# Patient Record
Sex: Male | Born: 1975 | Race: White | Hispanic: No | Smoking: Former smoker
Health system: Southern US, Community
[De-identification: ages and names within clinical notes are randomized; demographics above are authoritative.]

## PROBLEM LIST (undated history)

## (undated) DIAGNOSIS — E669 Obesity, unspecified: Secondary | ICD-10-CM

## (undated) DIAGNOSIS — E785 Hyperlipidemia, unspecified: Secondary | ICD-10-CM

## (undated) DIAGNOSIS — I10 Essential (primary) hypertension: Secondary | ICD-10-CM

## (undated) DIAGNOSIS — K760 Fatty (change of) liver, not elsewhere classified: Secondary | ICD-10-CM

## (undated) DIAGNOSIS — R002 Palpitations: Secondary | ICD-10-CM

## (undated) HISTORY — DX: Hyperlipidemia, unspecified: E78.5

## (undated) HISTORY — DX: Fatty (change of) liver, not elsewhere classified: K76.0

## (undated) HISTORY — DX: Obesity, unspecified: E66.9

## (undated) HISTORY — DX: Essential (primary) hypertension: I10

## (undated) HISTORY — DX: Palpitations: R00.2

## (undated) HISTORY — PX: OTHER SURGICAL HISTORY: SHX169

---

## 2008-09-26 ENCOUNTER — Ambulatory Visit: Payer: Self-pay | Admitting: Cardiology

## 2008-10-08 ENCOUNTER — Encounter: Payer: Self-pay | Admitting: Cardiology

## 2008-10-08 ENCOUNTER — Ambulatory Visit: Payer: Self-pay

## 2010-11-24 ENCOUNTER — Encounter: Payer: Self-pay | Admitting: Family Medicine

## 2010-11-24 DIAGNOSIS — E669 Obesity, unspecified: Secondary | ICD-10-CM

## 2010-11-24 DIAGNOSIS — K760 Fatty (change of) liver, not elsewhere classified: Secondary | ICD-10-CM | POA: Insufficient documentation

## 2010-11-24 DIAGNOSIS — E785 Hyperlipidemia, unspecified: Secondary | ICD-10-CM

## 2010-11-24 DIAGNOSIS — I10 Essential (primary) hypertension: Secondary | ICD-10-CM

## 2010-11-24 DIAGNOSIS — N400 Enlarged prostate without lower urinary tract symptoms: Secondary | ICD-10-CM | POA: Insufficient documentation

## 2010-11-24 DIAGNOSIS — R002 Palpitations: Secondary | ICD-10-CM | POA: Insufficient documentation

## 2011-01-19 NOTE — Assessment & Plan Note (Signed)
[facility]                            CARDIOLOGY OFFICE NOTE   DAYLN, TUGWELL                      MRN:          [id number]  DATE:09/26/2008                            DOB:          01-16-1976    REASON FOR CONSULTATION:  Evaluate the patient with palpitations.   HISTORY OF PRESENT ILLNESS:  The patient is a very pleasant 35 year old  gentleman with episodes of palpitations in the middle of the night.  He  describes these as waking him up from his sleep.  He described a  fluttering.  He was not describing tachy palpitations or sustained rapid  rates.  He feels cold during these.  He takes aspirin and drinks water  and he thinks this slowly improves, but it may take an hour or so.  He  does not have any of these symptoms during the day.  He is not getting  any presyncope or syncope.  He is not having any chest pressure, neck,  or arm discomfort.  He is not having any new shortness of breath and  denies any PND or orthopnea.  He did have labs drawn that included  normal potassium, renal function, and thyroid studies.  He has had  chronic elevated liver enzymes related to fatty liver.  He did wear a 30-  day event monitor.  He did press the button several times, but there  were no documented arrhythmias other than what sounds like sinus  bradycardia, sinus arrhythmia.  Of note, the patient does snore.  He has  had a recent diagnosis of hypertension.  He is now on medications.   PAST MEDICAL HISTORY:  Hypertension, recently diagnosed.  He has no  hyperlipidemia, diabetes, or other major medical problems.   PAST SURGICAL HISTORY:  He has had tympanostomy tubes.   ALLERGIES:  PENICILLIN and KEFLEX.   MEDICATIONS:  1. Zyrtec 10 mg daily.  2. Benicar HCT 40/25 daily.   SOCIAL HISTORY:  The patient works as a Psychologist, educational at a call center.  He is  divorced.  He has one child.  He does not smoke cigarettes.  He does not  drink alcohol.   FAMILY  HISTORY:  Contributory for his father having atrial fibrillation,  but otherwise, no heart disease, sudden cardiac death, and  cardiomyopathy.   REVIEW OF SYSTEMS:  As stated in the HPI.  Negative for all other  systems.   PHYSICAL EXAMINATION:  GENERAL:  The patient is pleasant and in no  distress.  VITAL SIGNS:  Blood pressure 150/98, heart rate 112 and regular, weight  376 pounds, and body mass index greater than 45.  HEENT:  Eyelids unremarkable, pupils equal, round, and reactive to  light, fundi not visualized, oral mucosa unremarkable.  NECK:  No jugular venous distention at 45 degrees, carotid upstroke  brisk and symmetric, no bruits, no thyromegaly.  LYMPHATICS:  No cervical, axillary, or inguinal adenopathy.  LUNGS:  Clear to auscultation bilaterally.  BACK:  No costovertebral angle tenderness.  CHEST:  Unremarkable.  HEART:  PMI not displaced or sustained, S1 and S2 within normal limits,  no  S3, no S4, no clicks, no rubs, no murmurs.  ABDOMEN:  Morbidly obese, positive bowel sounds normal in frequency and  pitch, no bruits, no rebound, no guarding, no midline pulsatile mass, no  hepatomegaly, no splenomegaly.  SKIN:  No rashes, no nodules.  EXTREMITIES:  Pulses 2+ throughout, no edema, no cyanosis, or clubbing.  NEURO:  Oriented to person, place, and time.  Cranial nerves II through  XII are grossly intact, motor grossly intact.   EKG sinus rhythm, rate 88, axis within normal limits, intervals within  normal limits, no acute ST-T wave changes.   ASSESSMENT AND PLAN:  1. Tachy palpitations.  The patient is describing some palpitations.      However, these predominately gone away since he stopped caffeine      which he was drinking quite a bit prior to this.  He has also been      placed on something for his blood pressure.  He has had between      these 2 things the symptoms have for the most part abated.  He did      wear a 30-day event monitor without capturing any  events.  I do not      think further monitoring or lab work is indicated.  He should get      an echocardiogram to make sure he has a structurally normal heart.      I suspect he does, though his exam is compromised by his weight.  2. Hypertension.  Blood pressure is elevated today.  However, he says      he has some white coat problems.  He was recently started on      Benicar and I will not make any changes to his regimen.  3. Elevated liver enzymes.  He apparently has a diagnosis of fatty      liver and this is followed by Dr. Christell Constant.  4. Morbid obesity.  It took quite a bit of time talking this over with      the patient and his mother that was in the room.  This is his life      threatening medical condition.  I have referred him to the [employer].  We also discussed the [employer].  He needs to do      something dramatic for weight loss and hopefully, he will comply      with that after this conversation.  5. Sleep apnea.  I suspect the patient has sleep apnea.  Certainly,      weight loss would be the preferred treatment.  If he has continued      hypertension and difficult to control, I would clearly send him for      sleep study, but we will defer to Dr. Christell Constant.  6. Followup will be based on the results of the echocardiogram.     Rollene Rotunda, MD, [facility]  Electronically Signed    JH/MedQ  DD: 09/26/2008  DT: 09/27/2008  Job #: [id number]   cc:   Ernestina Penna, M.D.

## 2012-06-26 ENCOUNTER — Ambulatory Visit: Payer: Self-pay | Admitting: Licensed Clinical Social Worker

## 2012-12-11 ENCOUNTER — Telehealth: Payer: Self-pay | Admitting: Pharmacist Clinician (PhC)/ Clinical Pharmacy Specialist

## 2013-01-02 ENCOUNTER — Ambulatory Visit (INDEPENDENT_AMBULATORY_CARE_PROVIDER_SITE_OTHER): Payer: 59 | Admitting: Pharmacist Clinician (PhC)/ Clinical Pharmacy Specialist

## 2013-01-02 VITALS — BP 142/90 | HR 88

## 2013-01-02 DIAGNOSIS — E785 Hyperlipidemia, unspecified: Secondary | ICD-10-CM

## 2013-01-02 DIAGNOSIS — R7989 Other specified abnormal findings of blood chemistry: Secondary | ICD-10-CM

## 2013-01-02 DIAGNOSIS — E291 Testicular hypofunction: Secondary | ICD-10-CM

## 2013-01-02 NOTE — Addendum Note (Signed)
Addended by: Monica Becton on: 01/02/2013 04:33 PM   Modules accepted: Orders

## 2013-01-02 NOTE — Addendum Note (Signed)
Addended by: Orma Render F on: 01/02/2013 04:29 PM   Modules accepted: Orders

## 2013-01-02 NOTE — Progress Notes (Signed)
  Subjective:    Patient ID: Corey Wilcox, male    DOB: 13-Mar-1976, 37 y.o.   MRN: [id number]  Hyperlipidemia This is a chronic problem. The current episode started more than 1 year ago. The problem is uncontrolled. Recent lipid tests were reviewed and are variable. Exacerbating diseases include liver disease and obesity. Factors aggravating his hyperlipidemia include fatty foods. Associated symptoms include shortness of breath. Current antihyperlipidemic treatment includes statins and diet change. The current treatment provides moderate improvement of lipids. There are no compliance problems.       Review of Systems  Constitutional: Negative.   HENT: Negative.   Eyes: Negative.   Respiratory: Positive for shortness of breath.   Cardiovascular: Negative.   Endocrine: Negative.   Genitourinary: Negative.   Allergic/Immunologic: Negative.   Neurological: Negative.   Hematological: Negative.   Psychiatric/Behavioral: Negative.        Objective:   Physical Exam  Constitutional: He is oriented to person, place, and time. He appears well-developed and well-nourished.  Cardiovascular: Normal rate, regular rhythm and normal heart sounds.   Pulmonary/Chest: No respiratory distress. He has no wheezes. He has no rales.  Musculoskeletal: He exhibits no edema and no tenderness.  Neurological: He is alert and oriented to person, place, and time.  Skin: Skin is warm and dry.  Psychiatric: He has a normal mood and affect. His behavior is normal. Judgment and thought content normal.          Assessment & Plan:  Patient had a snycopal episode today while attempting to have blood drawn by D[facility]. He has difficulty having his blood drawn every time and is a hard stick.  Patient received regular coke and peanut butter crackers in office and recovered.  He is taking crestor for hyperlipidemia so we need to obtain a lipid profile and also A1C with his past borderline elevations.  He will try to  come in next week, well hydrated, to have it drawn.  Gave a new meal plan to patient with counseling on diet and exercise goals.  Chari Manning

## 2013-01-02 NOTE — Addendum Note (Signed)
Addended by: Orma Render F on: 01/02/2013 04:28 PM   Modules accepted: Orders

## 2013-01-17 ENCOUNTER — Other Ambulatory Visit: Payer: Self-pay

## 2013-01-17 MED ORDER — ROSUVASTATIN CALCIUM 10 MG PO TABS: 10.0000 mg | ORAL_TABLET | Freq: Every day | ORAL | Status: DC

## 2013-01-18 NOTE — Telephone Encounter (Signed)
appt made for 01/02/13

## 2013-02-06 ENCOUNTER — Other Ambulatory Visit: Payer: Self-pay

## 2013-02-06 MED ORDER — OLMESARTAN MEDOXOMIL-HCTZ 40-25 MG PO TABS: 1.0000 | ORAL_TABLET | Freq: Every day | ORAL | Status: DC

## 2013-04-23 ENCOUNTER — Ambulatory Visit (INDEPENDENT_AMBULATORY_CARE_PROVIDER_SITE_OTHER): Payer: 59 | Admitting: Family Medicine

## 2013-04-23 ENCOUNTER — Telehealth: Payer: Self-pay | Admitting: Family Medicine

## 2013-04-23 ENCOUNTER — Encounter: Payer: Self-pay | Admitting: Family Medicine

## 2013-04-23 VITALS — BP 149/95 | HR 109 | Temp 98.2°F | Wt 389.8 lb

## 2013-04-23 DIAGNOSIS — J209 Acute bronchitis, unspecified: Secondary | ICD-10-CM

## 2013-04-23 MED ORDER — AZITHROMYCIN 250 MG PO TABS: ORAL_TABLET | ORAL | Status: DC

## 2013-04-23 MED ORDER — HYDROCODONE-HOMATROPINE 5-1.5 MG/5ML PO SYRP: 5.0000 mL | ORAL_SOLUTION | Freq: Three times a day (TID) | ORAL | Status: DC | PRN

## 2013-04-23 NOTE — Telephone Encounter (Signed)
Appt given for today 

## 2013-04-23 NOTE — Progress Notes (Signed)
  Subjective:    Patient ID: Javontay Vandam, male    DOB: 04-04-1976, 37 y.o.   MRN: [id number]  HPI  This 37 y.o. male presents for evaluation of persistent cough, uri sx's, and wheezing for Over a week.  He c/o mucopurulent productive sputum.  Review of Systems C/o cough and uri sx's.   No chest pain, SOB, HA, dizziness, vision change, N/V, diarrhea, constipation, dysuria, urinary urgency or frequency, myalgias, arthralgias or rash.  Objective:   Physical Exam  Vital signs noted  Well developed well nourished male.  HEENT - Head atraumatic Normocephalic                Eyes - PERRLA, Conjuctiva - clear Sclera- Clear EOMI                Ears - EAC's Wnl TM's Wnl Gross Hearing WNL                Nose - Nares patent                 Throat - oropharanx wnl Respiratory - Lungs diminished due to body habitus and scattered wheezes throughout. Cardiac - RRR S1 and S2 without murmur       Assessment & Plan:  Acute bronchitis - Plan: HYDROcodone-homatropine (HYCODAN) 5-1.5 MG/5ML syrup, azithromycin (ZITHROMAX) 250 MG tablet Continue mucinex, push po fluids, rest and rtc prn if sx's persist or continue.

## 2013-04-23 NOTE — Patient Instructions (Signed)

## 2013-06-09 ENCOUNTER — Telehealth: Payer: Self-pay | Admitting: Family Medicine

## 2013-06-11 ENCOUNTER — Telehealth: Payer: Self-pay | Admitting: Family Medicine

## 2013-06-11 MED ORDER — OLMESARTAN MEDOXOMIL-HCTZ 40-25 MG PO TABS: 1.0000 | ORAL_TABLET | Freq: Every day | ORAL | Status: DC

## 2013-06-11 NOTE — Telephone Encounter (Signed)
x

## 2013-06-12 NOTE — Telephone Encounter (Signed)
[facility] called today

## 2013-07-07 ENCOUNTER — Other Ambulatory Visit: Payer: Self-pay | Admitting: Family Medicine

## 2013-07-09 NOTE — Telephone Encounter (Signed)
May give patient one month of medicine, he also needs an appointment to be seen within that time

## 2013-07-09 NOTE — Telephone Encounter (Signed)
Patient last seen at first of year. Was to return for bloodwork but doesn't look like that was done. Please advise

## 2013-08-13 ENCOUNTER — Other Ambulatory Visit: Payer: Self-pay | Admitting: Family Medicine

## 2013-08-15 NOTE — Telephone Encounter (Signed)
Last lipids 01/02/13

## 2013-09-03 ENCOUNTER — Encounter: Payer: Self-pay | Admitting: General Practice

## 2013-09-03 ENCOUNTER — Ambulatory Visit (INDEPENDENT_AMBULATORY_CARE_PROVIDER_SITE_OTHER): Payer: 59

## 2013-09-03 ENCOUNTER — Ambulatory Visit (INDEPENDENT_AMBULATORY_CARE_PROVIDER_SITE_OTHER): Payer: 59 | Admitting: General Practice

## 2013-09-03 ENCOUNTER — Encounter (INDEPENDENT_AMBULATORY_CARE_PROVIDER_SITE_OTHER): Payer: Self-pay

## 2013-09-03 VITALS — BP 152/100 | HR 105 | Temp 97.7°F | Ht 68.7 in | Wt 395.0 lb

## 2013-09-03 DIAGNOSIS — R319 Hematuria, unspecified: Secondary | ICD-10-CM

## 2013-09-03 DIAGNOSIS — R109 Unspecified abdominal pain: Secondary | ICD-10-CM

## 2013-09-03 LAB — POCT URINALYSIS DIPSTICK
Bilirubin, UA: NEGATIVE
Glucose, UA: NEGATIVE
Leukocytes, UA: NEGATIVE
Nitrite, UA: NEGATIVE
Urobilinogen, UA: NEGATIVE

## 2013-09-03 LAB — POCT UA - MICROSCOPIC ONLY
Casts, Ur, LPF, POC: NEGATIVE
Mucus, UA: NEGATIVE
WBC, Ur, HPF, POC: NEGATIVE
Yeast, UA: NEGATIVE

## 2013-09-03 MED ORDER — KETOROLAC TROMETHAMINE 10 MG PO TABS: 10.0000 mg | ORAL_TABLET | Freq: Four times a day (QID) | ORAL | Status: DC | PRN

## 2013-09-03 NOTE — Progress Notes (Signed)
Subjective:    Patient ID: Corey Wilcox, male    DOB: 10/10/1975, 37 y.o.   MRN: [id number]  Flank Pain This is a new problem. The current episode started in the past 7 days. The problem has been waxing and waning since onset. The pain is present in the lumbar spine. The quality of the pain is described as aching. Radiates to: right lower abdominal pain along with right flank pain. The pain is at a severity of 5/10. The pain is the same all the time. Associated symptoms include abdominal pain and dysuria. Pertinent negatives include no bladder incontinence, bowel incontinence, chest pain, fever, leg pain, numbness, paresthesias or weakness. (Right lower abdominal pain, with flank pain)      Review of Systems  Constitutional: Negative for fever and chills.  Respiratory: Negative for chest tightness and shortness of breath.   Cardiovascular: Negative for chest pain and palpitations.  Gastrointestinal: Positive for abdominal pain. Negative for bowel incontinence.       Right lower quadrant pain along with flank pain  Genitourinary: Positive for dysuria, hematuria and flank pain. Negative for bladder incontinence, urgency, frequency, discharge, scrotal swelling and testicular pain.       Blood last noticed in urine on Saturday morning  Musculoskeletal:       Right flank pain  Neurological: Negative for dizziness, weakness, numbness and paresthesias.       Objective:   Physical Exam  Constitutional: He is oriented to person, place, and time. He appears well-developed and well-nourished.  Cardiovascular: Normal rate, regular rhythm and normal heart sounds.   Pulmonary/Chest: Effort normal and breath sounds normal. No respiratory distress. He exhibits no tenderness.  Abdominal: Soft. Bowel sounds are normal. He exhibits no distension. There is no tenderness.  Musculoskeletal: He exhibits no edema and no tenderness.  Neurological: He is alert and oriented to person, place, and time.    Skin: Skin is warm and dry.  Psychiatric: He has a normal mood and affect.    [facility] reading (PRIMARY) by Coralie Keens, FNP-C, no acute findings.                                     Results for orders placed in visit on 09/03/13  POCT UA - MICROSCOPIC ONLY      Result Value Range   WBC, Ur, HPF, POC neg     RBC, urine, microscopic 10-15     Bacteria, U Microscopic neg     Mucus, UA neg     Epithelial cells, urine per micros occ     Crystals, Ur, HPF, POC neg     Casts, Ur, LPF, POC neg     Yeast, UA neg    POCT URINALYSIS DIPSTICK      Result Value Range   Color, UA yellow     Clarity, UA clear     Glucose, UA neg     Bilirubin, UA neg     Ketones, UA neg     Spec Grav, UA 1.015     Blood, UA trace     pH, UA 6.0     Protein, UA neg     Urobilinogen, UA negative     Nitrite, UA neg     Leukocytes, UA Negative         Assessment & Plan:  1. Blood in urine  - POCT UA - Microscopic Only - POCT  urinalysis dipstick  2. Flank pain  - DG Abd 1 View; Future - ketorolac (TORADOL) 10 MG tablet; Take 1 tablet (10 mg total) by mouth every 6 (six) hours as needed.  Dispense: 20 tablet; Refill: 0 -discussed possibility of kidney stone and risk -Patient declined to have CT scan at this time -will RTO office if symptom worsen or unresolved, may seek emergency medical treatment -take medication as directed -Patient verbalized understanding Coralie Keens, FNP-C

## 2013-09-03 NOTE — Patient Instructions (Signed)
Kidney Stones  Kidney stones (urolithiasis) are deposits that form inside your kidneys. The intense pain is caused by the stone moving through the urinary tract. When the stone moves, the ureter goes into spasm around the stone. The stone is usually passed in the urine.   CAUSES   · A disorder that makes certain neck glands produce too much parathyroid hormone (primary hyperparathyroidism).  · A buildup of uric acid crystals, similar to gout in your joints.  · Narrowing (stricture) of the ureter.  · A kidney obstruction present at birth (congenital obstruction).  · Previous surgery on the kidney or ureters.  · Numerous kidney infections.  SYMPTOMS   · Feeling sick to your stomach (nauseous).  · Throwing up (vomiting).  · Blood in the urine (hematuria).  · Pain that usually spreads (radiates) to the groin.  · Frequency or urgency of urination.  DIAGNOSIS   · Taking a history and physical exam.  · Blood or urine tests.  · CT scan.  · Occasionally, an examination of the inside of the urinary bladder (cystoscopy) is performed.  TREATMENT   · Observation.  · Increasing your fluid intake.  · Extracorporeal shock wave lithotripsy This is a noninvasive procedure that uses shock waves to break up kidney stones.  · Surgery may be needed if you have severe pain or persistent obstruction. There are various surgical procedures. Most of the procedures are performed with the use of small instruments. Only small incisions are needed to accommodate these instruments, so recovery time is minimized.  The size, location, and chemical composition are all important variables that will determine the proper choice of action for you. Talk to your health care provider to better understand your situation so that you will minimize the risk of injury to yourself and your kidney.   HOME CARE INSTRUCTIONS   · Drink enough water and fluids to keep your urine clear or pale yellow. This will help you to pass the stone or stone fragments.  · Strain  all urine through the provided strainer. Keep all particulate matter and stones for your health care provider to see. The stone causing the pain may be as small as a grain of salt. It is very important to use the strainer each and every time you pass your urine. The collection of your stone will allow your health care provider to analyze it and verify that a stone has actually passed. The stone analysis will often identify what you can do to reduce the incidence of recurrences.  · Only take over-the-counter or prescription medicines for pain, discomfort, or fever as directed by your health care provider.  · Make a follow-up appointment with your health care provider as directed.  · Get follow-up X-rays if required. The absence of pain does not always mean that the stone has passed. It may have only stopped moving. If the urine remains completely obstructed, it can cause loss of kidney function or even complete destruction of the kidney. It is your responsibility to make sure X-rays and follow-ups are completed. Ultrasounds of the kidney can show blockages and the status of the kidney. Ultrasounds are not associated with any radiation and can be performed easily in a matter of minutes.  SEEK MEDICAL CARE IF:  · You experience pain that is progressive and unresponsive to any pain medicine you have been prescribed.  SEEK IMMEDIATE MEDICAL CARE IF:   · Pain cannot be controlled with the prescribed medicine.  · You have a fever   or shaking chills.  · The severity or intensity of pain increases over 18 hours and is not relieved by pain medicine.  · You develop a new onset of abdominal pain.  · You feel faint or pass out.  · You are unable to urinate.  MAKE SURE YOU:   · Understand these instructions.  · Will watch your condition.  · Will get help right away if you are not doing well or get worse.  Document Released: 08/23/2005 Document Revised: 04/25/2013 Document Reviewed: 01/24/2013  [employer]® Patient Information ©2014  [employer].

## 2013-09-16 ENCOUNTER — Other Ambulatory Visit: Payer: Self-pay | Admitting: Family Medicine

## 2013-09-18 NOTE — Telephone Encounter (Signed)
Seen [facility] 04/23/13  Last lipid 01/02/13

## 2013-09-19 ENCOUNTER — Other Ambulatory Visit: Payer: Self-pay | Admitting: Family Medicine

## 2013-09-19 ENCOUNTER — Telehealth: Payer: Self-pay | Admitting: Family Medicine

## 2013-09-19 NOTE — Telephone Encounter (Signed)
ntbs with [facility]

## 2013-09-20 MED ORDER — ROSUVASTATIN CALCIUM 10 MG PO TABS: 10.0000 mg | ORAL_TABLET | Freq: Every day | ORAL | Status: DC

## 2013-09-20 NOTE — Addendum Note (Signed)
Addended by: Gwenith DailyHUDY, KRISTEN N on: 09/20/2013 05:53 PM   Modules accepted: Orders

## 2013-09-27 NOTE — Telephone Encounter (Signed)
No message to address °

## 2013-09-27 NOTE — Telephone Encounter (Signed)
No note to address

## 2013-11-30 ENCOUNTER — Other Ambulatory Visit: Payer: Self-pay | Admitting: Family Medicine

## 2013-12-16 ENCOUNTER — Other Ambulatory Visit: Payer: Self-pay | Admitting: General Practice

## 2013-12-18 ENCOUNTER — Telehealth: Payer: Self-pay | Admitting: General Practice

## 2013-12-18 ENCOUNTER — Other Ambulatory Visit: Payer: Self-pay | Admitting: General Practice

## 2013-12-18 DIAGNOSIS — E785 Hyperlipidemia, unspecified: Secondary | ICD-10-CM

## 2013-12-18 MED ORDER — ROSUVASTATIN CALCIUM 10 MG PO TABS: 10.0000 mg | ORAL_TABLET | Freq: Every day | ORAL | Status: DC

## 2013-12-19 NOTE — Telephone Encounter (Signed)
No samples please send in Rx

## 2013-12-20 ENCOUNTER — Telehealth: Payer: Self-pay | Admitting: General Practice

## 2013-12-20 ENCOUNTER — Other Ambulatory Visit: Payer: Self-pay | Admitting: General Practice

## 2013-12-20 DIAGNOSIS — E785 Hyperlipidemia, unspecified: Secondary | ICD-10-CM

## 2013-12-20 MED ORDER — SIMVASTATIN 10 MG PO TABS: 10.0000 mg | ORAL_TABLET | Freq: Every day | ORAL | Status: DC

## 2013-12-20 NOTE — Telephone Encounter (Signed)
Script sent in for simvastatin

## 2013-12-20 NOTE — Telephone Encounter (Signed)
Please review

## 2013-12-25 ENCOUNTER — Encounter: Payer: Self-pay | Admitting: General Practice

## 2013-12-25 ENCOUNTER — Ambulatory Visit (INDEPENDENT_AMBULATORY_CARE_PROVIDER_SITE_OTHER): Payer: 59 | Admitting: General Practice

## 2013-12-25 VITALS — BP 126/71 | HR 102 | Temp 98.3°F | Ht 68.0 in | Wt >= 6400 oz

## 2013-12-25 DIAGNOSIS — E559 Vitamin D deficiency, unspecified: Secondary | ICD-10-CM

## 2013-12-25 DIAGNOSIS — I10 Essential (primary) hypertension: Secondary | ICD-10-CM

## 2013-12-25 DIAGNOSIS — E785 Hyperlipidemia, unspecified: Secondary | ICD-10-CM

## 2013-12-25 NOTE — Patient Instructions (Signed)

## 2013-12-25 NOTE — Progress Notes (Signed)
Subjective:    Patient ID: Corey Wilcox, male    DOB: 14-Sep-1975, 38 y.o.   MRN: [id number]  HPI Patient presents today for chronic health follow up. History of hypertension, hyperlipidemia, allergic rhinitis, and vitamin d deficiency. Reports working on healthy eating and increasing physical activity.     Review of Systems  Constitutional: Negative for fever and chills.  Respiratory: Negative for chest tightness and shortness of breath.   Cardiovascular: Negative for chest pain and palpitations.  Gastrointestinal: Negative for vomiting, abdominal pain, diarrhea, constipation and blood in stool.  Genitourinary: Negative for hematuria and difficulty urinating.  Neurological: Negative for dizziness, weakness and headaches.  Psychiatric/Behavioral: Negative for suicidal ideas, sleep disturbance and self-injury. The patient is not nervous/anxious.        Objective:   Physical Exam  Constitutional: He is oriented to person, place, and time. He appears well-developed and well-nourished.  Morbid obese  HENT:  Head: Normocephalic and atraumatic.  Right Ear: External ear normal.  Left Ear: External ear normal.  Mouth/Throat: Oropharynx is clear and moist.  Eyes: EOM are normal. Pupils are equal, round, and reactive to light.  Neck: Normal range of motion. Neck supple. No thyromegaly present.  Cardiovascular: Normal rate, regular rhythm and normal heart sounds.   Abdominal: Soft. Bowel sounds are normal. He exhibits no distension. There is no tenderness.  Lymphadenopathy:    He has no cervical adenopathy.  Neurological: He is alert and oriented to person, place, and time.  Skin: Skin is warm and dry.  Psychiatric: He has a normal mood and affect.          Assessment & Plan:  1. Hypertension  - CMP14+EGFR; Future  2. Hyperlipidemia  - Lipid panel; Future  3. Vitamin D deficiency  - Vit D  25 hydroxy (rtn osteoporosis monitoring); Future Continue all current  medications Labs pending F/u in 3 months Discussed benefits of regular exercise and healthy eating Patient verbalized understanding Erby Pian, FNP-C

## 2013-12-27 ENCOUNTER — Telehealth: Payer: Self-pay | Admitting: General Practice

## 2013-12-27 NOTE — Telephone Encounter (Signed)
Simvastatin has the same active ingredients and should work the same. If he notices any problems he should report them. Left message with this information.

## 2014-01-04 ENCOUNTER — Other Ambulatory Visit (INDEPENDENT_AMBULATORY_CARE_PROVIDER_SITE_OTHER): Payer: 59

## 2014-01-04 DIAGNOSIS — I10 Essential (primary) hypertension: Secondary | ICD-10-CM

## 2014-01-04 DIAGNOSIS — E559 Vitamin D deficiency, unspecified: Secondary | ICD-10-CM

## 2014-01-04 DIAGNOSIS — E785 Hyperlipidemia, unspecified: Secondary | ICD-10-CM

## 2014-01-04 NOTE — Progress Notes (Signed)
Pt came in for labs only 

## 2014-01-05 ENCOUNTER — Other Ambulatory Visit: Payer: Self-pay | Admitting: General Practice

## 2014-01-05 LAB — CMP14+EGFR
A/G RATIO: 2 (ref 1.1–2.5)
ALT: 50 IU/L — AB (ref 0–44)
AST: 26 IU/L (ref 0–40)
Albumin: 4 g/dL (ref 3.5–5.5)
Alkaline Phosphatase: 50 IU/L (ref 39–117)
BILIRUBIN TOTAL: 0.5 mg/dL (ref 0.0–1.2)
BUN/Creatinine Ratio: 19 (ref 8–19)
BUN: 16 mg/dL (ref 6–20)
CHLORIDE: 99 mmol/L (ref 97–108)
CO2: 27 mmol/L (ref 18–29)
Calcium: 9.1 mg/dL (ref 8.7–10.2)
Creatinine, Ser: 0.84 mg/dL (ref 0.76–1.27)
GFR, EST AFRICAN AMERICAN: 129 mL/min/{1.73_m2} (ref >59)
GFR, EST NON AFRICAN AMERICAN: 112 mL/min/{1.73_m2} (ref >59)
GLUCOSE: 86 mg/dL (ref 65–99)
Globulin, Total: 2 g/dL (ref 1.5–4.5)
POTASSIUM: 4.2 mmol/L (ref 3.5–5.2)
SODIUM: 141 mmol/L (ref 134–144)
TOTAL PROTEIN: 6 g/dL (ref 6.0–8.5)

## 2014-01-05 LAB — LIPID PANEL
CHOLESTEROL TOTAL: 167 mg/dL (ref 100–199)
Chol/HDL Ratio: 3.8 ratio units (ref 0.0–5.0)
HDL: 44 mg/dL (ref >39)
LDL Calculated: 87 mg/dL (ref 0–99)
Triglycerides: 179 mg/dL — ABNORMAL HIGH (ref 0–149)
VLDL CHOLESTEROL CAL: 36 mg/dL (ref 5–40)

## 2014-01-05 LAB — VITAMIN D 25 HYDROXY (VIT D DEFICIENCY, FRACTURES): VIT D 25 HYDROXY: 32.6 ng/mL (ref 30.0–100.0)

## 2014-05-29 ENCOUNTER — Telehealth: Payer: Self-pay | Admitting: General Practice

## 2014-05-29 MED ORDER — OLMESARTAN MEDOXOMIL-HCTZ 40-25 MG PO TABS: ORAL_TABLET | ORAL | Status: DC

## 2014-05-29 NOTE — Telephone Encounter (Signed)
done

## 2014-06-06 ENCOUNTER — Other Ambulatory Visit: Payer: Self-pay | Admitting: Family Medicine

## 2014-07-25 ENCOUNTER — Emergency Department (HOSPITAL_COMMUNITY): Payer: 59

## 2014-07-25 ENCOUNTER — Inpatient Hospital Stay (HOSPITAL_COMMUNITY): Payer: 59

## 2014-07-25 ENCOUNTER — Encounter (HOSPITAL_COMMUNITY): Payer: Self-pay | Admitting: Emergency Medicine

## 2014-07-25 ENCOUNTER — Inpatient Hospital Stay (HOSPITAL_COMMUNITY)
Admission: EM | Admit: 2014-07-25 | Discharge: 2014-07-30 | DRG: 871 | Disposition: A | Discharge status: Home / Self Care | Payer: 59 | Attending: Internal Medicine | Admitting: Internal Medicine

## 2014-07-25 DIAGNOSIS — N400 Enlarged prostate without lower urinary tract symptoms: Secondary | ICD-10-CM | POA: Diagnosis present

## 2014-07-25 DIAGNOSIS — Z6841 Body Mass Index (BMI) 40.0 and over, adult: Secondary | ICD-10-CM

## 2014-07-25 DIAGNOSIS — R509 Fever, unspecified: Secondary | ICD-10-CM | POA: Diagnosis present

## 2014-07-25 DIAGNOSIS — E876 Hypokalemia: Secondary | ICD-10-CM | POA: Diagnosis present

## 2014-07-25 DIAGNOSIS — T3695XA Adverse effect of unspecified systemic antibiotic, initial encounter: Secondary | ICD-10-CM | POA: Diagnosis not present

## 2014-07-25 DIAGNOSIS — R10A0 Flank pain, unspecified side: Secondary | ICD-10-CM

## 2014-07-25 DIAGNOSIS — I959 Hypotension, unspecified: Secondary | ICD-10-CM

## 2014-07-25 DIAGNOSIS — N1330 Unspecified hydronephrosis: Secondary | ICD-10-CM | POA: Diagnosis present

## 2014-07-25 DIAGNOSIS — R6521 Severe sepsis with septic shock: Secondary | ICD-10-CM | POA: Diagnosis present

## 2014-07-25 DIAGNOSIS — K521 Toxic gastroenteritis and colitis: Secondary | ICD-10-CM | POA: Diagnosis not present

## 2014-07-25 DIAGNOSIS — A419 Sepsis, unspecified organism: Secondary | ICD-10-CM | POA: Diagnosis present

## 2014-07-25 DIAGNOSIS — Z87442 Personal history of urinary calculi: Secondary | ICD-10-CM | POA: Diagnosis not present

## 2014-07-25 DIAGNOSIS — Z7982 Long term (current) use of aspirin: Secondary | ICD-10-CM | POA: Diagnosis not present

## 2014-07-25 DIAGNOSIS — E669 Obesity, unspecified: Secondary | ICD-10-CM

## 2014-07-25 DIAGNOSIS — I10 Essential (primary) hypertension: Secondary | ICD-10-CM | POA: Diagnosis present

## 2014-07-25 DIAGNOSIS — R8290 Unspecified abnormal findings in urine: Secondary | ICD-10-CM | POA: Diagnosis present

## 2014-07-25 DIAGNOSIS — L03116 Cellulitis of left lower limb: Secondary | ICD-10-CM

## 2014-07-25 DIAGNOSIS — E785 Hyperlipidemia, unspecified: Secondary | ICD-10-CM | POA: Diagnosis present

## 2014-07-25 DIAGNOSIS — K760 Fatty (change of) liver, not elsewhere classified: Secondary | ICD-10-CM | POA: Diagnosis present

## 2014-07-25 DIAGNOSIS — R05 Cough: Secondary | ICD-10-CM

## 2014-07-25 DIAGNOSIS — R059 Cough, unspecified: Secondary | ICD-10-CM

## 2014-07-25 DIAGNOSIS — R6520 Severe sepsis without septic shock: Secondary | ICD-10-CM

## 2014-07-25 DIAGNOSIS — R10A1 Flank pain, right side: Secondary | ICD-10-CM

## 2014-07-25 DIAGNOSIS — R109 Unspecified abdominal pain: Secondary | ICD-10-CM

## 2014-07-25 LAB — COMPREHENSIVE METABOLIC PANEL
ALK PHOS: 41 U/L (ref 39–117)
ALT: 51 U/L (ref 0–53)
ALT: 40 U/L (ref 0–53)
AST: 31 U/L (ref 0–37)
AST: 22 U/L (ref 0–37)
Albumin: 3.5 g/dL (ref 3.5–5.2)
Albumin: 2.7 g/dL — ABNORMAL LOW (ref 3.5–5.2)
Alkaline Phosphatase: 52 U/L (ref 39–117)
Anion gap: 14 (ref 5–15)
Anion gap: 13 (ref 5–15)
BILIRUBIN TOTAL: 0.7 mg/dL (ref 0.3–1.2)
BUN: 23 mg/dL (ref 6–23)
BUN: 24 mg/dL — ABNORMAL HIGH (ref 6–23)
CHLORIDE: 93 meq/L — AB (ref 96–112)
CO2: 27 meq/L (ref 19–32)
CO2: 23 mEq/L (ref 19–32)
CREATININE: 1.32 mg/dL (ref 0.50–1.35)
CREATININE: 1.43 mg/dL — AB (ref 0.50–1.35)
Calcium: 9.4 mg/dL (ref 8.4–10.5)
Calcium: 7.9 mg/dL — ABNORMAL LOW (ref 8.4–10.5)
Chloride: 103 mEq/L (ref 96–112)
GFR calc non Af Amer: 61 mL/min — ABNORMAL LOW (ref >90)
GFR, EST AFRICAN AMERICAN: 78 mL/min — AB (ref >90)
GFR, EST AFRICAN AMERICAN: 71 mL/min — AB (ref >90)
GFR, EST NON AFRICAN AMERICAN: 67 mL/min — AB (ref >90)
GLUCOSE: 96 mg/dL (ref 70–99)
GLUCOSE: 106 mg/dL — AB (ref 70–99)
POTASSIUM: 4.3 meq/L (ref 3.7–5.3)
Potassium: 3.8 mEq/L (ref 3.7–5.3)
Sodium: 134 mEq/L — ABNORMAL LOW (ref 137–147)
Sodium: 139 mEq/L (ref 137–147)
TOTAL PROTEIN: 5.5 g/dL — AB (ref 6.0–8.3)
Total Bilirubin: 0.7 mg/dL (ref 0.3–1.2)
Total Protein: 6.6 g/dL (ref 6.0–8.3)

## 2014-07-25 LAB — I-STAT CG4 LACTIC ACID, ED: LACTIC ACID, VENOUS: 1.97 mmol/L (ref 0.5–2.2)

## 2014-07-25 LAB — URINALYSIS, ROUTINE W REFLEX MICROSCOPIC
GLUCOSE, UA: NEGATIVE mg/dL
Hgb urine dipstick: NEGATIVE
KETONES UR: NEGATIVE mg/dL
Nitrite: NEGATIVE
PROTEIN: NEGATIVE mg/dL
Specific Gravity, Urine: 1.023 (ref 1.005–1.030)
Urobilinogen, UA: 0.2 mg/dL (ref 0.0–1.0)
pH: 5 (ref 5.0–8.0)

## 2014-07-25 LAB — CBC WITH DIFFERENTIAL/PLATELET
BASOS PCT: 0 % (ref 0–1)
Basophils Absolute: 0 10*3/uL (ref 0.0–0.1)
Basophils Absolute: 0 10*3/uL (ref 0.0–0.1)
Basophils Relative: 0 % (ref 0–1)
EOS ABS: 0 10*3/uL (ref 0.0–0.7)
Eosinophils Absolute: 0 10*3/uL (ref 0.0–0.7)
Eosinophils Relative: 0 % (ref 0–5)
Eosinophils Relative: 0 % (ref 0–5)
HCT: 39.6 % (ref 39.0–52.0)
HEMATOCRIT: 44.6 % (ref 39.0–52.0)
HEMOGLOBIN: 15.1 g/dL (ref 13.0–17.0)
Hemoglobin: 13.4 g/dL (ref 13.0–17.0)
LYMPHS ABS: 1.1 10*3/uL (ref 0.7–4.0)
LYMPHS PCT: 5 % — AB (ref 12–46)
LYMPHS PCT: 3 % — AB (ref 12–46)
Lymphs Abs: 1.1 10*3/uL (ref 0.7–4.0)
MCH: 31.4 pg (ref 26.0–34.0)
MCH: 31.5 pg (ref 26.0–34.0)
MCHC: 33.9 g/dL (ref 30.0–36.0)
MCHC: 33.8 g/dL (ref 30.0–36.0)
MCV: 92.7 fL (ref 78.0–100.0)
MCV: 93.2 fL (ref 78.0–100.0)
MONO ABS: 1.9 10*3/uL — AB (ref 0.1–1.0)
MONOS PCT: 8 % (ref 3–12)
MONOS PCT: 8 % (ref 3–12)
Monocytes Absolute: 2.8 10*3/uL — ABNORMAL HIGH (ref 0.1–1.0)
NEUTROS ABS: 20.1 10*3/uL — AB (ref 1.7–7.7)
Neutro Abs: 31.3 10*3/uL — ABNORMAL HIGH (ref 1.7–7.7)
Neutrophils Relative %: 87 % — ABNORMAL HIGH (ref 43–77)
Neutrophils Relative %: 89 % — ABNORMAL HIGH (ref 43–77)
Platelets: 293 10*3/uL (ref 150–400)
Platelets: 244 10*3/uL (ref 150–400)
RBC: 4.81 MIL/uL (ref 4.22–5.81)
RBC: 4.25 MIL/uL (ref 4.22–5.81)
RDW: 13.9 % (ref 11.5–15.5)
RDW: 14.6 % (ref 11.5–15.5)
WBC: 23 10*3/uL — ABNORMAL HIGH (ref 4.0–10.5)
WBC: 35.2 10*3/uL — AB (ref 4.0–10.5)

## 2014-07-25 LAB — LACTIC ACID, PLASMA: LACTIC ACID, VENOUS: 2.2 mmol/L (ref 0.5–2.2)

## 2014-07-25 LAB — TROPONIN I

## 2014-07-25 LAB — URINE MICROSCOPIC-ADD ON

## 2014-07-25 LAB — CK: CK TOTAL: 168 U/L (ref 7–232)

## 2014-07-25 LAB — CBG MONITORING, ED: GLUCOSE-CAPILLARY: 111 mg/dL — AB (ref 70–99)

## 2014-07-25 LAB — MRSA PCR SCREENING: MRSA BY PCR: NEGATIVE

## 2014-07-25 MED ORDER — DEXTROSE 5 % IV SOLN
2.0000 g | Freq: Three times a day (TID) | INTRAVENOUS | Status: DC
  Administered 2014-07-25 – 2014-07-26 (×4): 2 g via INTRAVENOUS
  Filled 2014-07-25 (×7): qty 2

## 2014-07-25 MED ORDER — DEXTROSE 5 % IV SOLN
2.0000 g | Freq: Once | INTRAVENOUS | Status: AC
  Administered 2014-07-25: 2 g via INTRAVENOUS
  Filled 2014-07-25: qty 2

## 2014-07-25 MED ORDER — LEVOFLOXACIN IN D5W 750 MG/150ML IV SOLN
750.0000 mg | INTRAVENOUS | Status: DC
  Administered 2014-07-26 – 2014-07-29 (×3): 750 mg via INTRAVENOUS
  Filled 2014-07-25 (×4): qty 150

## 2014-07-25 MED ORDER — ENOXAPARIN SODIUM 100 MG/ML ~~LOC~~ SOLN
90.0000 mg | SUBCUTANEOUS | Status: DC
  Administered 2014-07-25 – 2014-07-30 (×6): 90 mg via SUBCUTANEOUS
  Filled 2014-07-25 (×6): qty 1

## 2014-07-25 MED ORDER — SODIUM CHLORIDE 0.9 % IV SOLN
INTRAVENOUS | Status: AC
  Administered 2014-07-25 – 2014-07-26 (×3): via INTRAVENOUS

## 2014-07-25 MED ORDER — SODIUM CHLORIDE 0.9 % IV BOLUS (SEPSIS)
2000.0000 mL | Freq: Once | INTRAVENOUS | Status: AC
  Administered 2014-07-25 (×2): 2000 mL via INTRAVENOUS

## 2014-07-25 MED ORDER — LEVOFLOXACIN IN D5W 750 MG/150ML IV SOLN
750.0000 mg | Freq: Once | INTRAVENOUS | Status: AC
  Administered 2014-07-25: 750 mg via INTRAVENOUS
  Filled 2014-07-25: qty 150

## 2014-07-25 MED ORDER — VANCOMYCIN HCL 10 G IV SOLR
1500.0000 mg | Freq: Two times a day (BID) | INTRAVENOUS | Status: DC
  Administered 2014-07-25 – 2014-07-28 (×6): 1500 mg via INTRAVENOUS
  Filled 2014-07-25 (×6): qty 1500

## 2014-07-25 MED ORDER — ONDANSETRON HCL 4 MG/2ML IJ SOLN
4.0000 mg | Freq: Four times a day (QID) | INTRAMUSCULAR | Status: DC | PRN
  Administered 2014-07-26: 4 mg via INTRAVENOUS
  Filled 2014-07-25: qty 2

## 2014-07-25 MED ORDER — ACETAMINOPHEN 325 MG PO TABS
650.0000 mg | ORAL_TABLET | Freq: Four times a day (QID) | ORAL | Status: DC | PRN
  Administered 2014-07-25 – 2014-07-29 (×4): 650 mg via ORAL
  Filled 2014-07-25 (×4): qty 2

## 2014-07-25 MED ORDER — SIMVASTATIN 10 MG PO TABS
10.0000 mg | ORAL_TABLET | Freq: Every day | ORAL | Status: DC
  Administered 2014-07-25 – 2014-07-29 (×5): 10 mg via ORAL
  Filled 2014-07-25 (×7): qty 1

## 2014-07-25 MED ORDER — SODIUM CHLORIDE 0.9 % IV BOLUS (SEPSIS)
1000.0000 mL | Freq: Once | INTRAVENOUS | Status: AC
  Administered 2014-07-25: 1000 mL via INTRAVENOUS

## 2014-07-25 MED ORDER — ACETAMINOPHEN 650 MG RE SUPP: 650.0000 mg | Freq: Four times a day (QID) | RECTAL | Status: DC | PRN

## 2014-07-25 MED ORDER — VANCOMYCIN HCL 10 G IV SOLR
2500.0000 mg | Freq: Once | INTRAVENOUS | Status: AC
  Administered 2014-07-25: 2500 mg via INTRAVENOUS
  Filled 2014-07-25: qty 2500

## 2014-07-25 MED ORDER — ACETAMINOPHEN 325 MG PO TABS
650.0000 mg | ORAL_TABLET | Freq: Four times a day (QID) | ORAL | Status: DC | PRN
  Administered 2014-07-25: 650 mg via ORAL
  Filled 2014-07-25: qty 2

## 2014-07-25 MED ORDER — VANCOMYCIN HCL 1000 MG IV SOLR
1500.0000 mg | Freq: Two times a day (BID) | INTRAVENOUS | Status: DC
  Filled 2014-07-25: qty 1500

## 2014-07-25 MED ORDER — ONDANSETRON HCL 4 MG PO TABS: 4.0000 mg | ORAL_TABLET | Freq: Four times a day (QID) | ORAL | Status: DC | PRN

## 2014-07-25 MED ORDER — VANCOMYCIN HCL IN DEXTROSE 1-5 GM/200ML-% IV SOLN: 1000.0000 mg | Freq: Once | INTRAVENOUS | Status: DC

## 2014-07-25 NOTE — ED Notes (Signed)
RN was made aware of Pt becoming hypotensive, blood pressure was checked x2 for verification and in both arms.

## 2014-07-25 NOTE — ED Notes (Signed)
Pt states that when he got home from work he was unable to get warm. He states his temp was 102.9 F and his heart was racing.

## 2014-07-25 NOTE — Progress Notes (Signed)
[facility] PROGRESS NOTE  Corey Mylaraul Issac WUJ:[id number] DOB: 05/06/1976 DOA: 07/25/2014  PCP: Rudi HeapMOORE, DONALD, MD  Brief HPI: Corey Wilcox is a 38 y.o. male with history of hypertension and hyperlipidemia, morbidly obese who presented to the ER because of fever and chills. He had reported the symptoms of dysuria a few days ago. He also had lower back pain more towards the right side. When he was evaluated in the emergency department, patient was found to be febrile and hypotensive. He was subsequently admitted to the hospital.  Past medical history:  Past Medical History  Diagnosis Date  . Palpitations   . Hyperplasia of prostate   . Other and unspecified hyperlipidemia   . Obesity   . Fatty liver   . Essential hypertension, benign     Consultants: PCCM  Procedures: None  Antibiotics: Aztreonam/Levaquin/Vanc 11/19-->  Subjective: Patient still feels weak. However, somewhat better compared to last night. Denies any dizziness or lightheadedness. Denies any abdominal pain. No nausea, vomiting.  Objective: Vital Signs  Filed Vitals:   07/25/14 1330 07/25/14 1400 07/25/14 1430 07/25/14 1500  BP: 138/58  158/71 171/56  Pulse: 109 109 122 117  Temp:      TempSrc:      Resp: 15 22 20 17   Height:      Weight:      SpO2: 100% 100% 100% 98%    Intake/Output Summary (Last 24 hours) at 07/25/14 1515 Last data filed at 07/25/14 1500  Gross per 24 hour  Intake   1175 ml  Output      0 ml  Net   1175 ml   Filed Weights   07/25/14 0447  Weight: 183.7 kg (404 lb 15.8 oz)    General appearance: alert, cooperative, appears stated age, no distress and morbidly obese Resp: clear to auscultation bilaterally Cardio: regular rate and rhythm, S1, S2 normal, no murmur, click, rub or gallop GI: soft, non-tender; bowel sounds normal; no masses,  no organomegaly Extremities: He does have mild erythema over his left lower extremity. The area is warm to touch. No tenderness  to palpation. No fluctuation. Skin: Erythema over left lower extremity as described above  Lab Results:  Basic Metabolic Panel:  Recent Labs Lab 07/25/14 0105 07/25/14 0840  NA 134* 139  K 3.8 4.3  CL 93* 103  CO2 27 23  GLUCOSE 96 106*  BUN 23 24*  CREATININE 1.32 1.43*  CALCIUM 9.4 7.9*   Liver Function Tests:  Recent Labs Lab 07/25/14 0105 07/25/14 0840  AST 31 22  ALT 51 40  ALKPHOS 52 41  BILITOT 0.7 0.7  PROT 6.6 5.5*  ALBUMIN 3.5 2.7*   CBC:  Recent Labs Lab 07/25/14 0105 07/25/14 0840  WBC 23.0* 35.2*  NEUTROABS 20.1* 31.3*  HGB 15.1 13.4  HCT 44.6 39.6  MCV 92.7 93.2  PLT 293 244   Cardiac Enzymes:  Recent Labs Lab 07/25/14 0840  CKTOTAL 168  TROPONINI <0.30   CBG:  Recent Labs Lab 07/25/14 0148  GLUCAP 111*    Recent Results (from the past 240 hour(s))  MRSA PCR Screening     Status: None   Collection Time: 07/25/14  8:11 AM  Result Value Ref Range Status   MRSA by PCR NEGATIVE NEGATIVE Final    Comment:        The GeneXpert MRSA Assay (FDA approved for NASAL specimens only), is one component of a comprehensive MRSA colonization surveillance program. It is not intended to diagnose MRSA  infection nor to guide or monitor treatment for MRSA infections.       Studies/Results: Ct Abdomen Pelvis Wo Contrast  07/25/2014   CLINICAL DATA:  Nephrolithiasis, right flank pain and dysuria for several days prior to admission, fever, sepsis  EXAM: CT ABDOMEN AND PELVIS WITHOUT CONTRAST  TECHNIQUE: Multidetector CT imaging of the abdomen and pelvis was performed following the standard protocol without IV contrast.  COMPARISON:  None.  FINDINGS: There are extensive streak artifacts from patient's large body habitus which limits examination.  Sagittal images of the spine shows mild degenerative changes lower thoracic and lumbar spine. The lung bases are unremarkable.  There is fatty infiltration of the liver. No calcified gallstones are  noted within gallbladder. No intrahepatic biliary ductal dilatation. The pancreas, spleen and adrenal glands are unremarkable. Kidneys shows a punctate nonobstructive calcified calculus in midpole of the right kidney posterior aspect measures 2 mm. No left renal calcifications are noted. There is mild left hydronephrosis. Cortical thinning is noted left kidney. Left UPJ stricture cannot be excluded. No obstructive calcified calculi are identified. No hydroureter bilaterally. No calcified ureteral calculi bilaterally.  No aortic aneurysm. No small bowel obstruction. Normal appendix. No pericecal inflammation. Bilateral distal ureter is unremarkable. The urinary bladder is under distended grossly unremarkable.  The terminal ileum is unremarkable. No colonic obstruction. No ascites or free air. No adenopathy. No destructive bony lesions are noted within pelvis.  IMPRESSION: 1. Limited study by streaky artifacts from patient's large body habitus. There is a tiny 2 mm nonobstructive calcified calculus within mid pole of the right kidney. 2. Mild left hydronephrosis. No obstructive calcified calculus is noted. UPJ stricture cannot be excluded. Correlation with urology exam is recommended. Mild left renal cortical thinning. 3. Contracted gallbladder without evidence of calcified gallstones. 4. No calcified ureteral calculi are noted bilaterally. 5. No pericecal inflammation.  Normal appendix.   Electronically Signed   By: Natasha MeadLiviu  Pop M.D.   On: 07/25/2014 12:13   Dg Chest 2 View  07/25/2014   CLINICAL DATA:  Sudden onset of fever and chills. Patient fills week. Fever and cough.  EXAM: CHEST  2 VIEW  COMPARISON:  None.  FINDINGS: The heart size and mediastinal contours are within normal limits. Both lungs are clear. The visualized skeletal structures are unremarkable.  IMPRESSION: No active cardiopulmonary disease.   Electronically Signed   By: Burman NievesWilliam  Stevens M.D.   On: 07/25/2014 01:50    Medications:   Scheduled: . aztreonam  2 g Intravenous 3 times per day  . enoxaparin (LOVENOX) injection  90 mg Subcutaneous Q24H  . [START ON 07/26/2014] levofloxacin (LEVAQUIN) IV  750 mg Intravenous Q24H  . simvastatin  10 mg Oral q1800  . vancomycin (VANCOCIN) 750 mg IVPB  1,500 mg Intravenous Q12H   Continuous: . sodium chloride 125 mL/hr at 07/25/14 0900   JXB:[id number]:acetaminophen **OR** acetaminophen, ondansetron **OR** ondansetron (ZOFRAN) IV  Assessment/Plan:  Principal Problem:   Sepsis Active Problems:   Essential hypertension, benign   Hyperlipidemia   Severe sepsis     Sepsis  Patient was profoundly hypotensive when he was initially evaluated. He required multiple fluid boluses with which his blood pressure has finally stabilized. His lactic acid is normal. Appreciate critical care medicine assistance. Blood cultures are pending. Likely source is either urine or the left lower extremity cellulitis. Continue current antibiotics for now.   Abnormal UA with a history of nephrolithiasis. CT scan of the abdomen pelvis was obtained and report is as above. Mild  left hydronephrosis is noted. Continue to monitor his urine output and his creatinine. This will require further workup as an outpatient unless his renal function gets worse. No clear-cut obstructing stone was noted. Await urine cultures.  Cellulitis of the left lower extremity. Patient had an noted that his left leg was the reddish. This could be the source for his presentation. Continue with broad-spectrum antibiotics for now.  History of hypertension Holding his oral agents due to hypotension.  Hyperlipidemia Continue statins.  DVT Prophylaxis: Lovenox    Code Status: Full code  Family Communication: Discussed with the patient and his mother  Disposition Plan: Will remain in step down today.    LOS: 0 days   [facility]  [facility] Pager [phone] 07/25/2014, 3:15 PM  If 8PM-8AM, please contact  night-coverage at [email or web address], password [facility]

## 2014-07-25 NOTE — Consult Note (Signed)
PULMONARY / CRITICAL CARE MEDICINE   Name: Corey Wilcox MRN: [id number] DOB: 10/19/1975    ADMISSION DATE:  07/25/2014 CONSULTATION DATE: 11/19  REFERRING MD :  [facility]  CHIEF COMPLAINT: Sepsis  INITIAL PRESENTATION:  38 yo male with hx of nephrolithiasis developed Rt flank pain and dysuria several days prior to admission.  Developed fever, rigors from severe sepsis.  PCCM consulted to assist with management.   STUDIES:  11/19 Renal u/s >>  SIGNIFICANT EVENTS: 11/19 Admit   HISTORY OF PRESENT ILLNESS: 38 yo MO wm with a history of renal calculi, noted dysuria 11/15 and stayed home from work that day. Felt fine and worked 11/18 and felt fine till that evening when he developed hard rigors, fever 102.9, weakness and [facility]. Treated with abx and 4 litre's of IVF and responded to treatment. No need for cvl or respiratory support. PCCM asked to evaluate.  PAST MEDICAL HISTORY :   has a past medical history of Palpitations; Hyperplasia of prostate; Other and unspecified hyperlipidemia; Obesity; Fatty liver; and Essential hypertension, benign.    has past surgical history that includes tympanoplasty.   Prior to Admission medications   Medication Sig Start Date End Date Taking? Authorizing Provider  aspirin 325 MG tablet Take 325 mg by mouth once.   Yes Historical Provider, MD  cetirizine (ZYRTEC) 10 MG tablet Take 10 mg by mouth daily.     Yes Historical Provider, MD  cholecalciferol (VITAMIN D) 1000 UNITS tablet Take 1,000 Units by mouth daily.   Yes Historical Provider, MD  olmesartan-hydrochlorothiazide (BENICAR HCT) 40-25 MG per tablet TAKE 1 TABLET BY MOUTH DAILY. 05/29/14  Yes Deatra CanterWilliam J Oxford, FNP  simvastatin (ZOCOR) 10 MG tablet TAKE 1 TABLET (10 MG TOTAL) BY MOUTH AT BEDTIME. 06/07/14  Yes Ernestina Pennaonald W Moore, MD  CRESTOR 10 MG tablet Take 10 mg by mouth daily. 09/20/13   Historical Provider, MD   Allergies  Allergen Reactions  . Cephalexin Nausea And Vomiting  .  Penicillins Nausea And Vomiting and Rash    FAMILY HISTORY:  indicated that his mother is alive. He indicated that his father is deceased. He indicated that his sister is alive.  SOCIAL HISTORY:  reports that he has never smoked. He has never used smokeless tobacco. He reports that he drinks alcohol. He reports that he does not use illicit drugs.  REVIEW OF SYSTEMS:   10 point review of system taken, please see HPI for positives and negatives.   SUBJECTIVE:   VITAL SIGNS: Temp:  [99.4 F (37.4 C)] 99.4 F (37.4 C) (11/19 0400) Pulse Rate:  [115-160] 125 (11/19 0730) Resp:  [13-26] 15 (11/19 0730) BP: (63-117)/(37-73) 108/73 mmHg (11/19 0730) SpO2:  [92 %-99 %] 99 % (11/19 0730) Weight:  [404 lb 15.8 oz (183.7 kg)] 404 lb 15.8 oz (183.7 kg) (11/19 0447) INTAKE / OUTPUT: No intake or output data in the 24 hours ending 07/25/14 0847  PHYSICAL EXAMINATION: General: [name]  Neuro:  Intact HEENT:  No Neck/JVD/LAN Cardiovascular:  HSR RRR Lungs: CTA Abdomen:  Obese . Soft +bs Musculoskeletal:  intact Skin:  Left lower ext warm/red no overt skin breaks  LABS:  CBC  Recent Labs Lab 07/25/14 0105  WBC 23.0*  HGB 15.1  HCT 44.6  PLT 293   Coag's No results for input(s): APTT, INR in the last 168 hours.   BMET  Recent Labs Lab 07/25/14 0105  NA 134*  K 3.8  CL 93*  CO2 27  BUN 23  CREATININE 1.32  GLUCOSE 96   Electrolytes  Recent Labs Lab 07/25/14 0105  CALCIUM 9.4   Sepsis Markers  Recent Labs Lab 07/25/14 0104 07/25/14 0636  LATICACIDVEN 2.2 1.97   ABG No results for input(s): PHART, PCO2ART, PO2ART in the last 168 hours.   Liver Enzymes  Recent Labs Lab 07/25/14 0105  AST 31  ALT 51  ALKPHOS 52  BILITOT 0.7  ALBUMIN 3.5   Cardiac Enzymes No results for input(s): TROPONINI, PROBNP in the last 168 hours.   Glucose  Recent Labs Lab 07/25/14 0148  GLUCAP 111*   Urinalysis    Component Value Date/Time   COLORURINE  AMBER* 07/25/2014 0537   APPEARANCEUR CLOUDY* 07/25/2014 0537   LABSPEC 1.023 07/25/2014 0537   PHURINE 5.0 07/25/2014 0537   GLUCOSEU NEGATIVE 07/25/2014 0537   HGBUR NEGATIVE 07/25/2014 0537   BILIRUBINUR SMALL* 07/25/2014 0537   BILIRUBINUR neg 09/03/2013 0924   KETONESUR NEGATIVE 07/25/2014 0537   PROTEINUR NEGATIVE 07/25/2014 0537   PROTEINUR neg 09/03/2013 0924   UROBILINOGEN 0.2 07/25/2014 0537   UROBILINOGEN negative 09/03/2013 0924   NITRITE NEGATIVE 07/25/2014 0537   NITRITE neg 09/03/2013 0924   LEUKOCYTESUR TRACE* 07/25/2014 0537      Imaging No results found.   ASSESSMENT / PLAN:  Severe sepsis >> lactic acid improved, and blood pressure better after fluid resuscitation.  Likely sources are urinary system with hx of nephrolithiasis versus Lt leg cellulitis.  Hx of PCN, cephalosporin allergies. Plan: Continue IV fluids Day 1 of levaquin, azactam, vancomycin Check renal u/s D/c droplet isolation >> nothing to suggest influenza  Blood cx 11/19 >> Urine cx 11/9 >>   Hx of HTN, HLD. Plan: Hold outpt benicar HCT for now    [facility] ACNP [facility] Pager [phone] till 3 pm If no answer page [phone] 07/25/2014, 8:47 AM   Reviewed above, examined.  He has hx of nephrolithiasis and developed dysuria with Rt flank pain.  Developed fever, rigors, hypotension.  His blood pressure improved with IV fluids, and lactic acid has improved.  He is more comfortable.  Except for mild redness in Lt lower lower, remainder of his exam is unremarkable.  Will continue current Abx, and IV fluids.  Monitor hemodynamics, and f/u cx results.  Will check renal u/s.  He has several family members who have been followed by Dr. Annabell HowellsWrenn with urology >> if he needs urologist.  Nothing to suggest influenza.  Will d/c droplet isolation.  Updated pt's mother at bedside.  PCCM can be available as needed.  Coralyn HellingVineet Nick Armel, MD [facility] 07/25/2014, 8:55  AM Pager:  [phone] After 3pm call: [phone]

## 2014-07-25 NOTE — ED Notes (Cosign Needed)
Lactic acid given to Dr. Patria Maneampos.

## 2014-07-25 NOTE — ED Provider Notes (Addendum)
CSN: [id number]     Arrival date & time 07/25/14  0024 History   First MD Initiated Contact with Patient 07/25/14 0105     Chief Complaint  Patient presents with  . Tachycardia  . Chills    HPI Patient presents complaining of fever chills and generalized weakness this evening.  He was found to have a fever 103.  He presents emergency department with chills.  He does report some dysuria earlier in the week but reports that seems improved.  Denies nausea vomiting.  Denies chest pain.  Has had cough.  Denies shortness of breath.  No abdominal pain.  No back pain or flank pain.  No rash.  Patient is morbidly obese at 405 pounds.  No history of diabetes.   Past Medical History  Diagnosis Date  . Palpitations   . Hyperplasia of prostate   . Other and unspecified hyperlipidemia   . Obesity   . Fatty liver   . Essential hypertension, benign    History reviewed. No pertinent past surgical history. Family History  Problem Relation Age of Onset  . Stroke Father    History  Substance Use Topics  . Smoking status: Never Smoker   . Smokeless tobacco: Never Used  . Alcohol Use: Yes     Comment: occasionally    Review of Systems  All other systems reviewed and are negative.     Allergies  Cephalexin and Penicillins  Home Medications   Prior to Admission medications   Medication Sig Start Date End Date Taking? Authorizing Provider  aspirin 325 MG tablet Take 325 mg by mouth once.   Yes Historical Provider, MD  cetirizine (ZYRTEC) 10 MG tablet Take 10 mg by mouth daily.     Yes Historical Provider, MD  cholecalciferol (VITAMIN D) 1000 UNITS tablet Take 1,000 Units by mouth daily.   Yes Historical Provider, MD  olmesartan-hydrochlorothiazide (BENICAR HCT) 40-25 MG per tablet TAKE 1 TABLET BY MOUTH DAILY. 05/29/14  Yes Deatra CanterWilliam J Oxford, FNP  simvastatin (ZOCOR) 10 MG tablet TAKE 1 TABLET (10 MG TOTAL) BY MOUTH AT BEDTIME. 06/07/14  Yes Ernestina Pennaonald W Moore, MD  CRESTOR 10 MG tablet Take 10  mg by mouth daily. 09/20/13   Historical Provider, MD   BP 90/40 mmHg  Pulse 121  Temp(Src) 99.4 F (37.4 C) (Oral)  Resp 26  Ht 5\' 8"  (1.727 m)  Wt 404 lb 15.8 oz (183.7 kg)  BMI 61.59 kg/m2  SpO2 97% Physical Exam  Constitutional: He is oriented to person, place, and time. He appears well-developed and well-nourished.  HENT:  Head: Normocephalic and atraumatic.  Eyes: EOM are normal.  Neck: Normal range of motion.  Cardiovascular: Regular rhythm, normal heart sounds and intact distal pulses.   Tachycardic  Pulmonary/Chest: Effort normal and breath sounds normal. No respiratory distress.  Abdominal: Soft. He exhibits no distension. There is no tenderness.  Musculoskeletal: Normal range of motion.  Neurological: He is alert and oriented to person, place, and time.  Skin: Skin is warm and dry.  Psychiatric: He has a normal mood and affect. Judgment normal.  Nursing note and vitals reviewed.   ED Course  Procedures (including critical care time) CRITICAL CARE Performed by: Lyanne CoAMPOS,Prudie Guthridge M Total critical care time: 35 Critical care time was exclusive of separately billable procedures and treating other patients. Critical care was necessary to treat or prevent imminent or life-threatening deterioration. Critical care was time spent personally by me on the following activities: development of treatment plan with patient  and/or surrogate as well as nursing, discussions with consultants, evaluation of patient's response to treatment, examination of patient, obtaining history from patient or surrogate, ordering and performing treatments and interventions, ordering and review of laboratory studies, ordering and review of radiographic studies, pulse oximetry and re-evaluation of patient's condition.   Labs Review Labs Reviewed  CBC WITH DIFFERENTIAL - Abnormal; Notable for the following:    WBC 23.0 (*)    Neutrophils Relative % 87 (*)    Neutro Abs 20.1 (*)    Lymphocytes Relative 5  (*)    Monocytes Absolute 1.9 (*)    All other components within normal limits  COMPREHENSIVE METABOLIC PANEL - Abnormal; Notable for the following:    Sodium 134 (*)    Chloride 93 (*)    GFR calc non Af Amer 67 (*)    GFR calc Af Amer 78 (*)    All other components within normal limits  CBG MONITORING, ED - Abnormal; Notable for the following:    Glucose-Capillary 111 (*)    All other components within normal limits  URINE CULTURE  CULTURE, BLOOD (ROUTINE X 2)  CULTURE, BLOOD (ROUTINE X 2)  LACTIC ACID, PLASMA  URINALYSIS, ROUTINE W REFLEX MICROSCOPIC  I-STAT CG4 LACTIC ACID, ED    Imaging Review Dg Chest 2 View  07/25/2014   CLINICAL DATA:  Sudden onset of fever and chills. Patient fills week. Fever and cough.  EXAM: CHEST  2 VIEW  COMPARISON:  None.  FINDINGS: The heart size and mediastinal contours are within normal limits. Both lungs are clear. The visualized skeletal structures are unremarkable.  IMPRESSION: No active cardiopulmonary disease.   Electronically Signed   By: Burman NievesWilliam  Stevens M.D.   On: 07/25/2014 01:50  I personally reviewed the imaging tests through PACS system I reviewed available ER/hospitalization records through the EMR    EKG Interpretation   Date/Time:  Thursday July 25 2014 00:31:29 EST Ventricular Rate:  160 PR Interval:  122 QRS Duration: 70 QT Interval:  283 QTC Calculation: 462 R Axis:   30 Text Interpretation:  Sinus tachycardia Ventricular premature complex  Aberrant complex Low voltage, extremity and precordial leads No old  tracing to compare Confirmed by Loletta Harper  MD, Aasiya Creasey ([id number]) on 07/25/2014  6:08:42 AM      MDM   Final diagnoses:  Fever  Cough  Sepsis, due to unspecified organism  Hypotension, unspecified hypotension type    Lactate is reassuring at 2.2.  However patient remains tachycardic.  His pressure dropped to the emergency department he received 4 L IV fluids.  Broad-spectrum antibiotics.  Unclear source at this  time.  We attempted to obtain a urine sample however when he was attempting to give us a sample urinated on the floor instead accidentally.  Repeat urine sample will be obtained.  Suspect urosepsis.  Patient will need admission to stepdown unit.  Repeat lactate will be sent at this time.  At this time he is mentating well.  I do not think he needs pressors or central line placement.    Lyanne CoKevin M Kazim Corrales, MD 07/25/14 [id number]  Lyanne CoKevin M Que Meneely, MD 07/25/14 [id number]

## 2014-07-25 NOTE — ED Notes (Signed)
Pt missed the urinal, while in the room

## 2014-07-25 NOTE — H&P (Signed)
[facility] History and Physical  Corey Mylaraul Galbraith WUJ:[id number] DOB: 11/24/1975 DOA: 07/25/2014  Referring physician: ER physician. PCP: Rudi HeapMOORE, DONALD, MD  Chief Complaint: Fever and chills.  HPI: Corey Wilcox is a 38 y.o. male with history of hypertension and hyperlipidemia, morbidly obese presents to the ER because of fever and chills. Patient started developing fever and chills last evening abruptly. Denies any nausea vomiting abdominal pain, headache, chest pain shortness of breath or productive cough. Couple of days ago patient had some symptoms of dysuria but got resolved by itself. In the ER patient was found to be febrile and hypotensive. Blood cultures were obtained chest x-ray was unremarkable and UA still pending. Patient was given 4 L normocytic bolus in the fifth one is just ordered. Lactic acid is normal. Patient has been admitted for sepsis unknown source. Patient denies any recent travel sick contacts or any insect bites.   Review of Systems: As presented in the history of presenting illness, rest negative.  Past Medical History  Diagnosis Date  . Palpitations   . Hyperplasia of prostate   . Other and unspecified hyperlipidemia   . Obesity   . Fatty liver   . Essential hypertension, benign    Past Surgical History  Procedure Laterality Date  . Tympanoplasty     Social History:  reports that he has never smoked. He has never used smokeless tobacco. He reports that he drinks alcohol. He reports that he does not use illicit drugs. Where does patient live home. Can patient participate in ADLs? Yes.  Allergies  Allergen Reactions  . Cephalexin Nausea And Vomiting  . Penicillins Nausea And Vomiting and Rash    Family History:  Family History  Problem Relation Age of Onset  . Stroke Father   . Atrial fibrillation Father       Prior to Admission medications   Medication Sig Start Date End Date Taking? Authorizing Provider  aspirin 325 MG tablet Take  325 mg by mouth once.   Yes Historical Provider, MD  cetirizine (ZYRTEC) 10 MG tablet Take 10 mg by mouth daily.     Yes Historical Provider, MD  cholecalciferol (VITAMIN D) 1000 UNITS tablet Take 1,000 Units by mouth daily.   Yes Historical Provider, MD  olmesartan-hydrochlorothiazide (BENICAR HCT) 40-25 MG per tablet TAKE 1 TABLET BY MOUTH DAILY. 05/29/14  Yes Deatra CanterWilliam J Oxford, FNP  simvastatin (ZOCOR) 10 MG tablet TAKE 1 TABLET (10 MG TOTAL) BY MOUTH AT BEDTIME. 06/07/14  Yes Ernestina Pennaonald W Moore, MD  CRESTOR 10 MG tablet Take 10 mg by mouth daily. 09/20/13   Historical Provider, MD    Physical Exam: Filed Vitals:   07/25/14 0447 07/25/14 0500 07/25/14 0515 07/25/14 0532  BP:  63/37 77/51 90/40   Pulse:  115 121 120  Temp:      TempSrc:      Resp:  20 17 26   Height:      Weight: 183.7 kg (404 lb 15.8 oz)     SpO2:  95% 97% 95%     General:  Morbidly obese not in respiratory distress.  Eyes: Anicteric no pallor.  ENT: No discharge from the ears eyes nose and mouth.  Neck: No mass felt. No neck rigidity.  Cardiovascular: S1-S2 heard tachycardic.  Respiratory: No rhonchi or crepitations.  Abdomen: Soft nontender bowel sounds present. No guarding or rigidity.  Skin: No rash.  Musculoskeletal: No edema.  Psychiatric: Appears normal.  Neurologic: Alert awake oriented to time place and person. Moves all extremities.  Labs on Admission:  Basic Metabolic Panel:  Recent Labs Lab 07/25/14 0105  NA 134*  K 3.8  CL 93*  CO2 27  GLUCOSE 96  BUN 23  CREATININE 1.32  CALCIUM 9.4   Liver Function Tests:  Recent Labs Lab 07/25/14 0105  AST 31  ALT 51  ALKPHOS 52  BILITOT 0.7  PROT 6.6  ALBUMIN 3.5   No results for input(s): LIPASE, AMYLASE in the last 168 hours. No results for input(s): AMMONIA in the last 168 hours. CBC:  Recent Labs Lab 07/25/14 0105  WBC 23.0*  NEUTROABS 20.1*  HGB 15.1  HCT 44.6  MCV 92.7  PLT 293   Cardiac Enzymes: No results for  input(s): CKTOTAL, CKMB, CKMBINDEX, TROPONINI in the last 168 hours.  BNP (last 3 results) No results for input(s): PROBNP in the last 8760 hours. CBG:  Recent Labs Lab 07/25/14 0148  GLUCAP 111*    Radiological Exams on Admission: Dg Chest 2 View  07/25/2014   CLINICAL DATA:  Sudden onset of fever and chills. Patient fills week. Fever and cough.  EXAM: CHEST  2 VIEW  COMPARISON:  None.  FINDINGS: The heart size and mediastinal contours are within normal limits. Both lungs are clear. The visualized skeletal structures are unremarkable.  IMPRESSION: No active cardiopulmonary disease.   Electronically Signed   By: Burman NievesWilliam  Stevens M.D.   On: 07/25/2014 01:50     Assessment/Plan Principal Problem:   Sepsis Active Problems:   Essential hypertension, benign   Hyperlipidemia   1. Sepsis source unknown - suspect urinary tract infection given patient's symptom of dysuria. UA is pending. Follow blood cultures. Patient has a history of allergy to aspirin and cephalosporin and was placed on vancomycin and Levaquin and Azactam which will be continued. Since patient has requiring a fifth liter bolus I have consulted pulmonary and critical care. Patient will be admitted to stepdown unit for now. Continue with aggressive hydration and hold antihypertensives. Check influenza PCR. 2. History of hypertension - present is septic and hypotensive hold antihypertensives. 3. Hyperlipidemia - on statins.    Code Status: Full code.  Family Communication: Patient's mother at the bedside.  Disposition Plan: Admit to inpatient.    Bronson Bressman N. [facility] Pager [phone].  If 7PM-7AM, please contact night-coverage [email or web address] Password [email or web address] 07/25/2014, 6:22 AM

## 2014-07-25 NOTE — Progress Notes (Signed)
ANTIBIOTIC CONSULT NOTE - INITIAL  Pharmacy Consult for Azactam/Levaquin/Vancomycin Indication: Sepsis  Allergies  Allergen Reactions  . Cephalexin Nausea And Vomiting  . Penicillins Nausea And Vomiting and Rash    Patient Measurements: Height: 5\' 8"  (172.7 cm) Weight: (!) 404 lb 15.8 oz (183.7 kg) IBW/kg (Calculated) : 68.4   Vital Signs: Temp: 99.4 F (37.4 C) (11/19 0400) Temp Source: Oral (11/19 0400) BP: 71/48 mmHg (11/19 0430) Pulse Rate: 122 (11/19 0430) Intake/Output from previous day:   Intake/Output from this shift:    Labs:  Recent Labs  07/25/14 0105  WBC 23.0*  HGB 15.1  PLT 293  CREATININE 1.32   Estimated Creatinine Clearance: 122.9 mL/min (by C-G formula based on Cr of 1.32). No results for input(s): VANCOTROUGH, VANCOPEAK, VANCORANDOM, GENTTROUGH, GENTPEAK, GENTRANDOM, TOBRATROUGH, TOBRAPEAK, TOBRARND, AMIKACINPEAK, AMIKACINTROU, AMIKACIN in the last 72 hours.   Microbiology: No results found for this or any previous visit (from the past 720 hour(s)).  Medical History: Past Medical History  Diagnosis Date  . Palpitations   . Hyperplasia of prostate   . Other and unspecified hyperlipidemia   . Obesity   . Fatty liver   . Essential hypertension, benign     Medications:  Scheduled:   Infusions:  . aztreonam 2 g (07/25/14 0437)  . levofloxacin (LEVAQUIN) IV    . vancomycin     Assessment: [age] yoM c/o inability to get warm, T=102.9 and racing heart.  Azactam/Levaquin/Vancomycin per Rx for Sepsis.   Goal of Therapy:  Vancomycin trough level 15-20 mcg/ml  Plan:   Verify new wt once pt admitted   Levaquin 750mg  IV q24h  Azactam 2Gm IV q8h   Vancomycin 2500mg  x1 then 1500mg  IV q12h  F/u SCr/levels/cultures as needed  Susanne GreenhouseGreen, Temica Righetti R 07/25/2014,4:58 AM

## 2014-07-26 DIAGNOSIS — R8290 Unspecified abnormal findings in urine: Secondary | ICD-10-CM

## 2014-07-26 DIAGNOSIS — R609 Edema, unspecified: Secondary | ICD-10-CM

## 2014-07-26 LAB — CBC
HCT: 38.1 % — ABNORMAL LOW (ref 39.0–52.0)
Hemoglobin: 13 g/dL (ref 13.0–17.0)
MCH: 31.8 pg (ref 26.0–34.0)
MCHC: 34.1 g/dL (ref 30.0–36.0)
MCV: 93.2 fL (ref 78.0–100.0)
Platelets: 219 10*3/uL (ref 150–400)
RBC: 4.09 MIL/uL — AB (ref 4.22–5.81)
RDW: 15 % (ref 11.5–15.5)
WBC: 19.8 10*3/uL — AB (ref 4.0–10.5)

## 2014-07-26 LAB — BASIC METABOLIC PANEL
ANION GAP: 13 (ref 5–15)
BUN: 17 mg/dL (ref 6–23)
CALCIUM: 8 mg/dL — AB (ref 8.4–10.5)
CO2: 25 mEq/L (ref 19–32)
Chloride: 100 mEq/L (ref 96–112)
Creatinine, Ser: 1.03 mg/dL (ref 0.50–1.35)
Glucose, Bld: 95 mg/dL (ref 70–99)
POTASSIUM: 3.4 meq/L — AB (ref 3.7–5.3)
SODIUM: 138 meq/L (ref 137–147)

## 2014-07-26 LAB — URINE CULTURE: Colony Count: 15000

## 2014-07-26 LAB — CLOSTRIDIUM DIFFICILE BY PCR: CDIFFPCR: NEGATIVE

## 2014-07-26 MED ORDER — SACCHAROMYCES BOULARDII 250 MG PO CAPS
250.0000 mg | ORAL_CAPSULE | Freq: Two times a day (BID) | ORAL | Status: DC
  Administered 2014-07-26 – 2014-07-30 (×9): 250 mg via ORAL
  Filled 2014-07-26 (×10): qty 1

## 2014-07-26 MED ORDER — LOPERAMIDE HCL 2 MG PO CAPS
4.0000 mg | ORAL_CAPSULE | Freq: Three times a day (TID) | ORAL | Status: DC | PRN
  Administered 2014-07-26: 4 mg via ORAL
  Filled 2014-07-26: qty 2

## 2014-07-26 MED ORDER — POTASSIUM CHLORIDE CRYS ER 20 MEQ PO TBCR
40.0000 meq | EXTENDED_RELEASE_TABLET | Freq: Once | ORAL | Status: AC
  Administered 2014-07-26: 40 meq via ORAL
  Filled 2014-07-26: qty 2

## 2014-07-26 NOTE — Progress Notes (Signed)
Agree with previous Rn's assessment. Patient alert and oriented, no complaints at this time.

## 2014-07-26 NOTE — Evaluation (Signed)
Physical Therapy Evaluation Patient Details Name: Corey Wilcox MRN: [id number] DOB: 07/25/1976 Today's Date: 07/26/2014   History of Present Illness  Corey Wilcox is a 38 y.o. male with history of hypertension and hyperlipidemia, morbidly obese presents to the ER because of fever and chills. Dx of sepsis, source unknown, possibly urinary.   Clinical Impression  *Pt ambulated 400' without an assistive device, no loss of balance. HR 125 with walking. He is independent with mobility. Encouraged pt to ambulated 2-3x/day in hall to prevent deconditioning. **    Follow Up Recommendations No PT follow up    Equipment Recommendations  None recommended by PT    Recommendations for Other Services       Precautions / Restrictions Precautions Precautions: None Precaution Comments: pt denies falls in past year Restrictions Weight Bearing Restrictions: No      Mobility  Bed Mobility Overal bed mobility: Independent                Transfers Overall transfer level: Independent                  Ambulation/Gait Ambulation/Gait assistance: Independent Ambulation Distance (Feet): 400 Feet Assistive device: None Gait Pattern/deviations: WFL(Within Functional Limits)        Stairs            Wheelchair Mobility    Modified Rankin (Stroke Patients Only)       Balance Overall balance assessment: No apparent balance deficits (not formally assessed)                                           Pertinent Vitals/Pain Pain Assessment: No/denies pain    Home Living Family/patient expects to be discharged to:: Private residence Living Arrangements: Parent           Home Layout: Two level Home Equipment: None      Prior Function Level of Independence: Independent         Comments: works as Psychologist, 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     [employer]        Extremity/Trunk Assessment   Upper Extremity Assessment: Overall WFL for tasks  assessed           Lower Extremity Assessment: Overall WFL for tasks assessed      Cervical / Trunk Assessment: Normal  Communication   Communication: No difficulties  Cognition Arousal/Alertness: Awake/alert Behavior During Therapy: WFL for tasks assessed/performed Overall Cognitive Status: Within Functional Limits for tasks assessed                      General Comments      Exercises        Assessment/Plan    PT Assessment Patent does not need any further PT services  PT Diagnosis     PT Problem List    PT Treatment Interventions     PT Goals (Current goals can be found in the Care Plan section) Acute Rehab PT Goals PT Goal Formulation: All assessment and education complete, DC therapy    Frequency     Barriers to discharge        Co-evaluation               End of Session   Activity Tolerance: Patient tolerated treatment well Patient left: in chair;with call bell/phone within reach;with family/visitor present Nurse Communication: Mobility status  Time: [id number] PT Time Calculation (min) (ACUTE ONLY): 9 min   Charges:   PT Evaluation $Initial PT Evaluation Tier I: 1 Procedure PT Treatments $Gait Training: 8-22 mins   PT G Codes:          Tamala SerUhlenberg, Aanyah Loa Kistler 07/26/2014, 1:53 PM  [phone]

## 2014-07-26 NOTE — Care Management Note (Signed)
  Page 2 of 2   07/26/2014     8:06:09 AM CARE MANAGEMENT NOTE 07/26/2014  Patient:  Corey Wilcox,Corey Wilcox   Account Number:  [id number]  Date Initiated:  07/26/2014  Documentation initiated by:  Corey Wilcox  Subjective/Objective Assessment:   hydronephrosis with temp elevated wbc and hypotensive     Action/Plan:   home when stable   Anticipated DC Date:  07/29/2014   Anticipated DC Plan:  HOME/SELF CARE  In-house referral  NA      DC Planning Services  CM consult      PAC Choice  NA   Choice offered to / List presented to:  NA   DME arranged  NA      DME agency  NA     HH arranged  NA      HH agency  NA   Status of service:  In process, will continue to follow Medicare Important Message given?  NA - LOS <3 / Initial given by admissions (If response is "NO", the following Medicare IM given date fields will be blank) Date Medicare IM given:   Medicare IM given by:   Date Additional Medicare IM given:   Additional Medicare IM given by:    Discharge Disposition:    Per UR Regulation:  Reviewed for med. necessity/level of care/duration of stay  If discussed at Long Length of Stay Meetings, dates discussed:    Comments:  [id number]/Corey Wilcox Earlene Plateravis, RN, BSN, CCM Chart reviewed. Discharge needs and patient's stay to be reviewed and followed by case manager. Chart note for progression of stay: Assessment/Plan: Principal Problem: Sepsis Patient was profoundly hypotensive when he was initially evaluated. He required multiple fluid boluses with which his blood pressure has finally stabilized. His lactic acid is normal. Appreciate critical care medicine assistance. Blood cultures are pending. Likely source is either urine or the left lower extremity cellulitis. Continue current antibiotics for now. Abnormal UA with a history of nephrolithiasis. CT scan of the abdomen pelvis was obtained and report is as above. Mild left hydronephrosis is noted. Continue to monitor his urine  output and his creatinine. This will require further workup as an outpatient unless his renal function gets worse. No clear-cut obstructing stone was noted. Await urine cultures. Cellulitis of the left lower extremity. Patient had an noted that his left leg was the reddish. This could be the source for his presentation. Continue with broad-spectrum antibiotics for now. History of hypertension Holding his oral agents due to hypotension. Hyperlipidemia Continue statins.

## 2014-07-26 NOTE — Progress Notes (Signed)
Loss of IV access, IV team made several unsuccessful attempts to regain access. Corey Wilcox notified and order for PICC line placed. PICC to be placed in the morning after 0700. Pt will miss his night doses of vancomycin, azactam, and levaquin. [name], [name], RN

## 2014-07-26 NOTE — Clinical Documentation Improvement (Signed)
Presents with Sepsis; likely a urinary source or from left lower extremity Cellulitis (11/19 progress note).   Profoundly hypotensive  Multiple fluid boluses required - documented in 11/19 progress note.  Please provide a diagnosis associated with the above clinical data and treatment provided and document findings in next progress note and include in discharge summary.  Septic Shock Hypovolemic Shock Other Condition  Thank You, Shellee MiloEileen T Semya Klinke ,RN Clinical Documentation Specialist:  [phone]  [facility] Information Management

## 2014-07-26 NOTE — Progress Notes (Signed)
[facility] PROGRESS NOTE  Corey Wilcox ZOX:[id number] DOB: 1976-07-04 DOA: 07/25/2014  PCP: Rudi Heap, MD  Brief HPI: Corey Wilcox is a 38 y.o. male with history of hypertension and hyperlipidemia, morbidly obese who presented to the ER because of fever and chills. He had reported the symptoms of dysuria a few days ago. He also had lower back pain more towards the right side. When he was evaluated in the emergency department, patient was found to be febrile and hypotensive. He was subsequently admitted to the hospital.  Past medical history:  Past Medical History  Diagnosis Date  . Palpitations   . Hyperplasia of prostate   . Other and unspecified hyperlipidemia   . Obesity   . Fatty liver   . Essential hypertension, benign     Consultants: PCCM  Procedures:  Left Leg Venous Doppler Pending  Antibiotics: Aztreonam/Levaquin/Vanc 11/19-->  Subjective: Patient feels better this morning. Reported the multiple episodes of loose stool overnight. Denies any nausea, vomiting or abdominal pain. Left leg appears to be more reddish compared to yesterday.   Objective: Vital Signs  Filed Vitals:   07/26/14 0300 07/26/14 0400 07/26/14 0500 07/26/14 0600  BP: 131/66 129/74 132/60 139/58  Pulse: 108 108 108 105  Temp:  99.6 F (37.6 C)    TempSrc:  Oral    Resp: 17 17 16 17   Height:      Weight:      SpO2: 93% 97% 94% 92%    Intake/Output Summary (Last 24 hours) at 07/26/14 0743 Last data filed at 07/26/14 0300  Gross per 24 hour  Intake   2715 ml  Output      0 ml  Net   2715 ml   Filed Weights   07/25/14 0447  Weight: 183.7 kg (404 lb 15.8 oz)    General appearance: alert, cooperative, appears stated age, no distress and morbidly obese Resp: clear to auscultation bilaterally Cardio: regular rate and rhythm, S1, S2 normal, no murmur, click, rub or gallop GI: soft, non-tender; bowel sounds normal; no masses,  no organomegaly Extremities: Seems to  have had progression of the erythema of the left lower extremity. The area is warm to touch. Nontender. No obvious calf tenderness.  Skin: Erythema over left lower extremity as described above  Lab Results:  Basic Metabolic Panel:  Recent Labs Lab 07/25/14 0105 07/25/14 0840 07/26/14 0402  NA 134* 139 138  K 3.8 4.3 3.4*  CL 93* 103 100  CO2 27 23 25   GLUCOSE 96 106* 95  BUN 23 24* 17  CREATININE 1.32 1.43* 1.03  CALCIUM 9.4 7.9* 8.0*   Liver Function Tests:  Recent Labs Lab 07/25/14 0105 07/25/14 0840  AST 31 22  ALT 51 40  ALKPHOS 52 41  BILITOT 0.7 0.7  PROT 6.6 5.5*  ALBUMIN 3.5 2.7*   CBC:  Recent Labs Lab 07/25/14 0105 07/25/14 0840 07/26/14 0402  WBC 23.0* 35.2* 19.8*  NEUTROABS 20.1* 31.3*  --   HGB 15.1 13.4 13.0  HCT 44.6 39.6 38.1*  MCV 92.7 93.2 93.2  PLT 293 244 219   Cardiac Enzymes:  Recent Labs Lab 07/25/14 0840  CKTOTAL 168  TROPONINI <0.30   CBG:  Recent Labs Lab 07/25/14 0148  GLUCAP 111*    Recent Results (from the past 240 hour(s))  MRSA PCR Screening     Status: None   Collection Time: 07/25/14  8:11 AM  Result Value Ref Range Status   MRSA by PCR NEGATIVE NEGATIVE  Final    Comment:        The GeneXpert MRSA Assay (FDA approved for NASAL specimens only), is one component of a comprehensive MRSA colonization surveillance program. It is not intended to diagnose MRSA infection nor to guide or monitor treatment for MRSA infections.       Studies/Results: Ct Abdomen Pelvis Wo Contrast  07/25/2014   CLINICAL DATA:  Nephrolithiasis, right flank pain and dysuria for several days prior to admission, fever, sepsis  EXAM: CT ABDOMEN AND PELVIS WITHOUT CONTRAST  TECHNIQUE: Multidetector CT imaging of the abdomen and pelvis was performed following the standard protocol without IV contrast.  COMPARISON:  None.  FINDINGS: There are extensive streak artifacts from patient's large body habitus which limits examination.   Sagittal images of the spine shows mild degenerative changes lower thoracic and lumbar spine. The lung bases are unremarkable.  There is fatty infiltration of the liver. No calcified gallstones are noted within gallbladder. No intrahepatic biliary ductal dilatation. The pancreas, spleen and adrenal glands are unremarkable. Kidneys shows a punctate nonobstructive calcified calculus in midpole of the right kidney posterior aspect measures 2 mm. No left renal calcifications are noted. There is mild left hydronephrosis. Cortical thinning is noted left kidney. Left UPJ stricture cannot be excluded. No obstructive calcified calculi are identified. No hydroureter bilaterally. No calcified ureteral calculi bilaterally.  No aortic aneurysm. No small bowel obstruction. Normal appendix. No pericecal inflammation. Bilateral distal ureter is unremarkable. The urinary bladder is under distended grossly unremarkable.  The terminal ileum is unremarkable. No colonic obstruction. No ascites or free air. No adenopathy. No destructive bony lesions are noted within pelvis.  IMPRESSION: 1. Limited study by streaky artifacts from patient's large body habitus. There is a tiny 2 mm nonobstructive calcified calculus within mid pole of the right kidney. 2. Mild left hydronephrosis. No obstructive calcified calculus is noted. UPJ stricture cannot be excluded. Correlation with urology exam is recommended. Mild left renal cortical thinning. 3. Contracted gallbladder without evidence of calcified gallstones. 4. No calcified ureteral calculi are noted bilaterally. 5. No pericecal inflammation.  Normal appendix.   Electronically Signed   By: Natasha MeadLiviu  Pop M.D.   On: 07/25/2014 12:13   Dg Chest 2 View  07/25/2014   CLINICAL DATA:  Sudden onset of fever and chills. Patient fills week. Fever and cough.  EXAM: CHEST  2 VIEW  COMPARISON:  None.  FINDINGS: The heart size and mediastinal contours are within normal limits. Both lungs are clear. The  visualized skeletal structures are unremarkable.  IMPRESSION: No active cardiopulmonary disease.   Electronically Signed   By: Burman NievesWilliam  Stevens M.D.   On: 07/25/2014 01:50    Medications:  Scheduled: . aztreonam  2 g Intravenous 3 times per day  . enoxaparin (LOVENOX) injection  90 mg Subcutaneous Q24H  . levofloxacin (LEVAQUIN) IV  750 mg Intravenous Q24H  . potassium chloride  40 mEq Oral Once  . saccharomyces boulardii  250 mg Oral BID  . simvastatin  10 mg Oral q1800  . vancomycin  1,500 mg Intravenous Q12H   Continuous: . sodium chloride 125 mL/hr at 07/26/14 0415   UJW:[id number]:acetaminophen **OR** acetaminophen, ondansetron **OR** ondansetron (ZOFRAN) IV  Assessment/Plan:  Principal Problem:   Sepsis Active Problems:   Essential hypertension, benign   Hyperlipidemia   Severe sepsis   Cellulitis of leg, left   Abnormal urinalysis    Acute Diarrhea Possibly due to antibiotics. Denies abdominal pain. C diff is negative. Start Florastor. Imodium PRN.  Sepsis/Possible Septic Shock  Patient was profoundly hypotensive when he was initially evaluated. He required multiple fluid boluses with which his blood pressure has finally stabilized. His lactic acid is normal. CCM was briefly involved. Blood cultures are pending. Likely source is either urine or the left lower extremity cellulitis. Continue current antibiotics for now. Will de-escalate tomorrow morning. WBC is improved today. Blood pressures stable. Heart rate is still slightly elevated.  Abnormal UA with a history of nephrolithiasis. CT scan of the abdomen pelvis was obtained and report is as above. Mild left hydronephrosis is noted. Continue to monitor his urine output and his creatinine. This will require further workup as an outpatient unless his renal function gets worse. No clear-cut obstructing stone was noted. Await urine cultures.  Cellulitis of the left lower extremity. Increase in the area of erythema this morning.  Continue to monitor for now. Demarcate the area. Continue with current antibiotics. Obtain Doppler study.   History of hypertension Holding his oral agents due to hypotension.  Hyperlipidemia Continue statins.  Hypokalemia. This will be repleted.  DVT Prophylaxis: Lovenox    Code Status: Full code  Family Communication: Discussed with the patient and his mother  Disposition Plan: Okay for transfer to the floor.    LOS: 1 day   [facility],Corey Reierson  [facility] Pager [phone] 07/26/2014, 7:43 AM  If 8PM-8AM, please contact night-coverage at [email or web address], password [email or web address]

## 2014-07-26 NOTE — Progress Notes (Signed)
VASCULAR LAB PRELIMINARY  PRELIMINARY  PRELIMINARY  PRELIMINARY  Left lower extremity venous duplex completed.    Preliminary report:  Left:  No evidence of DVT, superficial thrombosis, or Baker's cyst.  Morene Cecilio, RVS 07/26/2014, 11:29 AM

## 2014-07-27 LAB — BASIC METABOLIC PANEL
ANION GAP: 10 (ref 5–15)
BUN: 12 mg/dL (ref 6–23)
CALCIUM: 8.6 mg/dL (ref 8.4–10.5)
CO2: 26 meq/L (ref 19–32)
Chloride: 103 mEq/L (ref 96–112)
Creatinine, Ser: 0.88 mg/dL (ref 0.50–1.35)
GFR calc Af Amer: >90 mL/min (ref >90)
GLUCOSE: 80 mg/dL (ref 70–99)
POTASSIUM: 3.3 meq/L — AB (ref 3.7–5.3)
SODIUM: 139 meq/L (ref 137–147)

## 2014-07-27 LAB — CBC
HCT: 40.1 % (ref 39.0–52.0)
HEMOGLOBIN: 13.3 g/dL (ref 13.0–17.0)
MCH: 31.1 pg (ref 26.0–34.0)
MCHC: 33.2 g/dL (ref 30.0–36.0)
MCV: 93.7 fL (ref 78.0–100.0)
PLATELETS: 232 10*3/uL (ref 150–400)
RBC: 4.28 MIL/uL (ref 4.22–5.81)
RDW: 15.1 % (ref 11.5–15.5)
WBC: 9.3 10*3/uL (ref 4.0–10.5)

## 2014-07-27 MED ORDER — POTASSIUM CHLORIDE CRYS ER 20 MEQ PO TBCR
40.0000 meq | EXTENDED_RELEASE_TABLET | Freq: Once | ORAL | Status: AC
  Administered 2014-07-27: 40 meq via ORAL
  Filled 2014-07-27: qty 2

## 2014-07-27 MED ORDER — SODIUM CHLORIDE 0.9 % IJ SOLN
10.0000 mL | Freq: Two times a day (BID) | INTRAMUSCULAR | Status: DC
  Administered 2014-07-27 – 2014-07-28 (×3): 10 mL

## 2014-07-27 MED ORDER — SODIUM CHLORIDE 0.9 % IJ SOLN
10.0000 mL | INTRAMUSCULAR | Status: DC | PRN
  Administered 2014-07-28 – 2014-07-29 (×5): 10 mL
  Administered 2014-07-30: 20 mL
  Filled 2014-07-27 (×6): qty 40

## 2014-07-27 NOTE — Plan of Care (Signed)
Problem: Phase I Progression Outcomes Goal: OOB as tolerated unless otherwise ordered Outcome: Completed/Met Date Met:  07/27/14     

## 2014-07-27 NOTE — Progress Notes (Signed)
Peripherally Inserted Central Catheter/Midline Placement  The IV Nurse has discussed with the patient and/or persons authorized to consent for the patient, the purpose of this procedure and the potential benefits and risks involved with this procedure.  The benefits include less needle sticks, lab draws from the catheter and patient may be discharged home with the catheter.  Risks include, but not limited to, infection, bleeding, blood clot (thrombus formation), and puncture of an artery; nerve damage and irregular heat beat.  Alternatives to this procedure were also discussed.  PICC/Midline Placement Documentation  PICC / Midline Double Lumen 07/27/14 PICC Right Basilic 51 cm 0 cm (Active)  Indication for Insertion or Continuance of Line Poor Vasculature-patient has had multiple peripheral attempts or PIVs lasting less than 24 hours 07/27/2014  9:16 AM  Exposed Catheter (cm) 0 cm 07/27/2014  9:16 AM  Site Assessment Clean;Dry;Intact 07/27/2014  9:16 AM  Lumen #1 Status Flushed;Saline locked;Blood return noted 07/27/2014  9:16 AM  Lumen #2 Status Flushed;Saline locked;Blood return noted 07/27/2014  9:16 AM  Dressing Type Transparent 07/27/2014  9:16 AM  Dressing Change Due 08/03/14 07/27/2014  9:16 AM       Ethelda Chickurrie, Reshunda Strider Robert 07/27/2014, 9:28 AM

## 2014-07-27 NOTE — Plan of Care (Signed)
Problem: Phase I Progression Outcomes Goal: Hemodynamically stable Outcome: Completed/Met Date Met:  07/27/14  Problem: Phase II Progression Outcomes Goal: Progress activity as tolerated unless otherwise ordered Outcome: Progressing Pt able to walk in room independently. Goal: Discharge plan established Outcome: Progressing Goal: Vital signs remain stable Outcome: Completed/Met Date Met:  07/27/14 Goal: IV changed to normal saline lock Outcome: Progressing Still receiving IV ABX. Goal: Obtain order to discontinue catheter if appropriate Outcome: Not Applicable Date Met:  93/57/01

## 2014-07-27 NOTE — Plan of Care (Signed)
Problem: Phase III Progression Outcomes Goal: Pain controlled on oral analgesia Outcome: Completed/Met Date Met:  07/27/14

## 2014-07-27 NOTE — Plan of Care (Signed)
Problem: Phase I Progression Outcomes Goal: Initial discharge plan identified Outcome: Completed/Met Date Met:  07/27/14     

## 2014-07-27 NOTE — Plan of Care (Signed)
Problem: Phase I Progression Outcomes Goal: Pain controlled with appropriate interventions Outcome: Completed/Met Date Met:  07/27/14     

## 2014-07-27 NOTE — Progress Notes (Signed)
[facility] PROGRESS NOTE  Corey Wilcox ZOX:[id number] DOB: 12/05/1975 DOA: 07/25/2014  PCP: Rudi HeapMOORE, DONALD, MD  Brief HPI: Corey Wilcox is a 38 y.o. male with history of hypertension and hyperlipidemia, morbidly obese who presented to the ER because of fever and chills. He had reported the symptoms of dysuria a few days ago. He also had lower back pain more towards the right side. When he was evaluated in the emergency department, patient was found to be febrile and hypotensive. He was subsequently admitted to the hospital.  Past medical history:  Past Medical History  Diagnosis Date  . Palpitations   . Hyperplasia of prostate   . Other and unspecified hyperlipidemia   . Obesity   . Fatty liver   . Essential hypertension, benign     Consultants: PCCM  Procedures:  Left Leg Venous Doppler No DVT  PICC line placed 11/21  Antibiotics: Levaquin/Vanc 11/19--> Aztreonam 11/19--11/21  Subjective: Patient continues to improve. Denies any complaints. Diarrhea seems to be improving. Right leg looks about the same.  Objective: Vital Signs  Filed Vitals:   07/26/14 1313 07/26/14 1354 07/26/14 2142 07/27/14 0443  BP: 145/67  131/75 136/82  Pulse: 107 125 110 106  Temp: 97.8 F (36.6 C)  98 F (36.7 C) 98.5 F (36.9 C)  TempSrc: Oral  Oral Oral  Resp: 16  18 16   Height:      Weight:    187.971 kg (414 lb 6.4 oz)  SpO2: 97%  94% 98%    Intake/Output Summary (Last 24 hours) at 07/27/14 [id number] Last data filed at 07/26/14 1502  Gross per 24 hour  Intake     50 ml  Output      0 ml  Net     50 ml   Filed Weights   07/25/14 0447 07/27/14 0443  Weight: 183.7 kg (404 lb 15.8 oz) 187.971 kg (414 lb 6.4 oz)    General appearance: alert, cooperative, appears stated age, no distress and morbidly obese Resp: clear to auscultation bilaterally Cardio: regular rate and rhythm, S1, S2 normal, no murmur, click, rub or gallop GI: soft, non-tender; bowel sounds normal;  no masses,  no organomegaly. Morbidly obese Extremities: Erythema hasn't crossed the demarcation line. Actually might have improved some from yesterday. In certain areas. Continues to be warm to touch. Nontender.  Skin: Erythema over left lower extremity as described above  Lab Results:  Basic Metabolic Panel:  Recent Labs Lab 07/25/14 0105 07/25/14 0840 07/26/14 0402 07/27/14 0536  NA 134* 139 138 139  K 3.8 4.3 3.4* 3.3*  CL 93* 103 100 103  CO2 27 23 25 26   GLUCOSE 96 106* 95 80  BUN 23 24* 17 12  CREATININE 1.32 1.43* 1.03 0.88  CALCIUM 9.4 7.9* 8.0* 8.6   Liver Function Tests:  Recent Labs Lab 07/25/14 0105 07/25/14 0840  AST 31 22  ALT 51 40  ALKPHOS 52 41  BILITOT 0.7 0.7  PROT 6.6 5.5*  ALBUMIN 3.5 2.7*   CBC:  Recent Labs Lab 07/25/14 0105 07/25/14 0840 07/26/14 0402 07/27/14 0536  WBC 23.0* 35.2* 19.8* 9.3  NEUTROABS 20.1* 31.3*  --   --   HGB 15.1 13.4 13.0 13.3  HCT 44.6 39.6 38.1* 40.1  MCV 92.7 93.2 93.2 93.7  PLT 293 244 219 232   Cardiac Enzymes:  Recent Labs Lab 07/25/14 0840  CKTOTAL 168  TROPONINI <0.30   CBG:  Recent Labs Lab 07/25/14 0148  GLUCAP 111*  Recent Results (from the past 240 hour(s))  Blood culture (routine x 2)     Status: None (Preliminary result)   Collection Time: 07/25/14  1:21 AM  Result Value Ref Range Status   Specimen Description BLOOD LEFT HAND  Final   Special Requests BOTTLES DRAWN AEROBIC AND ANAEROBIC 5CC  Final   Culture  Setup Time   Final    07/25/2014 04:44 Performed at [facility]    Culture   Final           BLOOD CULTURE RECEIVED NO GROWTH TO DATE CULTURE WILL BE HELD FOR 5 DAYS BEFORE ISSUING A FINAL NEGATIVE REPORT Performed at [facility]    Report Status PENDING  Incomplete  Blood culture (routine x 2)     Status: None (Preliminary result)   Collection Time: 07/25/14  1:31 AM  Result Value Ref Range Status   Specimen Description BLOOD RIGHT FOREARM  Final     Special Requests BOTTLES DRAWN AEROBIC AND ANAEROBIC 3CC  Final   Culture  Setup Time   Final    07/25/2014 04:44 Performed at [facility]    Culture   Final           BLOOD CULTURE RECEIVED NO GROWTH TO DATE CULTURE WILL BE HELD FOR 5 DAYS BEFORE ISSUING A FINAL NEGATIVE REPORT Performed at [facility]    Report Status PENDING  Incomplete  Urine culture     Status: None   Collection Time: 07/25/14  5:37 AM  Result Value Ref Range Status   Specimen Description URINE, RANDOM  Final   Special Requests NONE  Final   Culture  Setup Time   Final    07/25/2014 09:00 Performed at [facility]    Colony Count   Final    15,000 COLONIES/ML Performed at [facility]    Culture   Final    Multiple bacterial morphotypes present, none predominant. Suggest appropriate recollection if clinically indicated. Performed at [facility]    Report Status 07/26/2014 FINAL  Final  MRSA PCR Screening     Status: None   Collection Time: 07/25/14  8:11 AM  Result Value Ref Range Status   MRSA by PCR NEGATIVE NEGATIVE Final    Comment:        The GeneXpert MRSA Assay (FDA approved for NASAL specimens only), is one component of a comprehensive MRSA colonization surveillance program. It is not intended to diagnose MRSA infection nor to guide or monitor treatment for MRSA infections.   Clostridium Difficile by PCR     Status: None   Collection Time: 07/26/14  6:31 AM  Result Value Ref Range Status   C difficile by pcr NEGATIVE NEGATIVE Final    Comment: [facility]formed at [facility]      Studies/Results: Ct Abdomen Pelvis Wo Contrast  07/25/2014   CLINICAL DATA:  Nephrolithiasis, right flank pain and dysuria for several days prior to admission, fever, sepsis  EXAM: CT ABDOMEN AND PELVIS WITHOUT CONTRAST  TECHNIQUE: Multidetector CT imaging of the abdomen and pelvis was performed following the standard protocol without IV contrast.  COMPARISON:   None.  FINDINGS: There are extensive streak artifacts from patient's large body habitus which limits examination.  Sagittal images of the spine shows mild degenerative changes lower thoracic and lumbar spine. The lung bases are unremarkable.  There is fatty infiltration of the liver. No calcified gallstones are noted within gallbladder. No intrahepatic biliary ductal dilatation. The  pancreas, spleen and adrenal glands are unremarkable. Kidneys shows a punctate nonobstructive calcified calculus in midpole of the right kidney posterior aspect measures 2 mm. No left renal calcifications are noted. There is mild left hydronephrosis. Cortical thinning is noted left kidney. Left UPJ stricture cannot be excluded. No obstructive calcified calculi are identified. No hydroureter bilaterally. No calcified ureteral calculi bilaterally.  No aortic aneurysm. No small bowel obstruction. Normal appendix. No pericecal inflammation. Bilateral distal ureter is unremarkable. The urinary bladder is under distended grossly unremarkable.  The terminal ileum is unremarkable. No colonic obstruction. No ascites or free air. No adenopathy. No destructive bony lesions are noted within pelvis.  IMPRESSION: 1. Limited study by streaky artifacts from patient's large body habitus. There is a tiny 2 mm nonobstructive calcified calculus within mid pole of the right kidney. 2. Mild left hydronephrosis. No obstructive calcified calculus is noted. UPJ stricture cannot be excluded. Correlation with urology exam is recommended. Mild left renal cortical thinning. 3. Contracted gallbladder without evidence of calcified gallstones. 4. No calcified ureteral calculi are noted bilaterally. 5. No pericecal inflammation.  Normal appendix.   Electronically Signed   By: Natasha Mead M.D.   On: 07/25/2014 12:13    Medications:  Scheduled: . enoxaparin (LOVENOX) injection  90 mg Subcutaneous Q24H  . levofloxacin (LEVAQUIN) IV  750 mg Intravenous Q24H  .  potassium chloride  40 mEq Oral Once  . saccharomyces boulardii  250 mg Oral BID  . simvastatin  10 mg Oral q1800  . vancomycin  1,500 mg Intravenous Q12H   Continuous:   [id number] **OR** acetaminophen, loperamide, ondansetron **OR** ondansetron (ZOFRAN) IV  Assessment/Plan:  Principal Problem:   Sepsis Active Problems:   Essential hypertension, benign   Hyperlipidemia   Severe sepsis   Cellulitis of leg, left   Abnormal urinalysis    Acute Diarrhea Negative C. difficile. This is most likely due to antibiotics. Imodium as needed. Continue Florastor   Sepsis/Possible Septic Shock  Much improved. Patient was profoundly hypotensive when he was initially evaluated. He required multiple fluid boluses with which his blood pressure has finally stabilized. His lactic acid is normal. CCM was briefly involved. Blood cultures are pending. Likely source is either urine or the left lower extremity cellulitis. WBC continues to improve. Blood pressures stable.  Abnormal UA with a history of nephrolithiasis. CT scan of the abdomen pelvis was obtained and report is as above. Mild left hydronephrosis is noted. Continue to monitor his urine output and his creatinine. This will require further workup as an outpatient unless his renal function gets worse. No clear-cut obstructing stone was noted. Urine cultures negative  Cellulitis of the left lower extremity. Stable to slight improvement today. Blood cultures are negative. Will continue just vancomycin and Levaquin for now. No DVT noted on Doppler study.   History of hypertension Holding his oral agents due to hypotension.  Hyperlipidemia Continue statins.  Hypokalemia. This will be repleted.  DVT Prophylaxis: Lovenox    Code Status: Full code  Family Communication: Discussed with the patient and his mother  Disposition Plan: Mobilize. PICC line had to be placed due to poor venous access.    LOS: 2 days    [facility]  [facility] Pager [phone] 07/27/2014, 9:18 AM  If 8PM-8AM, please contact night-coverage at [email or web address], password [facility]

## 2014-07-27 NOTE — Progress Notes (Signed)
OT Cancellation Note  Patient Details Name: Corey Wilcox MRN: [id number] DOB: 07/14/1976   Cancelled Treatment:    Reason Eval/Treat Not Completed: OT screened, no needs identified, will sign off - Pt has been ambulating independently, and reports he is able to perform his ADLs without difficulty   Angelene GiovanniConarpe, Jazzie Trampe M  [place] Quambaonarpe, OTR/L [phone]  07/27/2014, 11:46 AM

## 2014-07-27 NOTE — Plan of Care (Signed)
Problem: Phase I Progression Outcomes Goal: Voiding-avoid urinary catheter unless indicated Outcome: Completed/Met Date Met:  07/27/14     

## 2014-07-28 DIAGNOSIS — N1330 Unspecified hydronephrosis: Secondary | ICD-10-CM

## 2014-07-28 LAB — BASIC METABOLIC PANEL
ANION GAP: 11 (ref 5–15)
BUN: 12 mg/dL (ref 6–23)
CO2: 28 meq/L (ref 19–32)
Calcium: 8.8 mg/dL (ref 8.4–10.5)
Chloride: 104 mEq/L (ref 96–112)
Creatinine, Ser: 0.8 mg/dL (ref 0.50–1.35)
GFR calc non Af Amer: >90 mL/min (ref >90)
Glucose, Bld: 84 mg/dL (ref 70–99)
POTASSIUM: 3.7 meq/L (ref 3.7–5.3)
SODIUM: 143 meq/L (ref 137–147)

## 2014-07-28 LAB — VANCOMYCIN, TROUGH: VANCOMYCIN TR: 8.8 ug/mL — AB (ref 10.0–20.0)

## 2014-07-28 MED ORDER — VANCOMYCIN HCL 10 G IV SOLR
1750.0000 mg | Freq: Two times a day (BID) | INTRAVENOUS | Status: DC
  Administered 2014-07-29: 1750 mg via INTRAVENOUS
  Filled 2014-07-28: qty 1750

## 2014-07-28 MED ORDER — CETAPHIL MOISTURIZING EX LOTN
TOPICAL_LOTION | CUTANEOUS | Status: DC | PRN
  Filled 2014-07-28: qty 473

## 2014-07-28 MED ORDER — CETAPHIL MOISTURIZING EX CREA
TOPICAL_CREAM | CUTANEOUS | Status: DC | PRN
  Filled 2014-07-28: qty 453

## 2014-07-28 NOTE — Progress Notes (Signed)
[facility] PROGRESS NOTE  Corey Wilcox ZOX:[id number] DOB: 1976-04-24 DOA: 07/25/2014  PCP: Rudi Heap, MD  Brief HPI: Corey Wilcox is a 38 y.o. male with history of hypertension and hyperlipidemia, morbidly obese who presented to the ER because of fever and chills. He had reported the symptoms of dysuria a few days ago. He also had lower back pain more towards the right side. When he was evaluated in the emergency department, patient was found to be febrile and hypotensive. He was subsequently admitted to the hospital.  Past medical history:  Past Medical History  Diagnosis Date  . Palpitations   . Hyperplasia of prostate   . Other and unspecified hyperlipidemia   . Obesity   . Fatty liver   . Essential hypertension, benign     Consultants: PCCM has signed off  Procedures:  Left Leg Venous Doppler No DVT  PICC line placed 11/21  Antibiotics: Levaquin/Vanc 11/19--> Aztreonam 11/19--11/21  Subjective: Patient feels better. Diarrhea has resolved. Left leg appears to be improved.   Objective: Vital Signs  Filed Vitals:   07/27/14 0443 07/27/14 1500 07/27/14 2043 07/28/14 0513  BP: 136/82 141/83 134/77 136/81  Pulse: 106 89 91 93  Temp: 98.5 F (36.9 C) 97.8 F (36.6 C) 97.5 F (36.4 C) 97.8 F (36.6 C)  TempSrc: Oral Oral Oral Oral  Resp: 16 18 16 16   Height:      Weight: 187.971 kg (414 lb 6.4 oz)   188.197 kg (414 lb 14.4 oz)  SpO2: 98% 98% 97% 98%    Intake/Output Summary (Last 24 hours) at 07/28/14 0912 Last data filed at 07/28/14 0820  Gross per 24 hour  Intake   2230 ml  Output      0 ml  Net   2230 ml   Filed Weights   07/25/14 0447 07/27/14 0443 07/28/14 0513  Weight: 183.7 kg (404 lb 15.8 oz) 187.971 kg (414 lb 6.4 oz) 188.197 kg (414 lb 14.4 oz)    General appearance: alert, cooperative, appears stated age, no distress and morbidly obese Resp: clear to auscultation bilaterally Cardio: regular rate and rhythm, S1, S2 normal,  no murmur, click, rub or gallop GI: soft, non-tender; bowel sounds normal; no masses,  no organomegaly. Morbidly obese Extremities: Left leg appears to be much better. Erythema is improved.  Skin: Erythema over left lower extremity as described above  Lab Results:  Basic Metabolic Panel:  Recent Labs Lab 07/25/14 0105 07/25/14 0840 07/26/14 0402 07/27/14 0536 07/28/14 0552  NA 134* 139 138 139 143  K 3.8 4.3 3.4* 3.3* 3.7  CL 93* 103 100 103 104  CO2 27 23 25 26 28   GLUCOSE 96 106* 95 80 84  BUN 23 24* 17 12 12   CREATININE 1.32 1.43* 1.03 0.88 0.80  CALCIUM 9.4 7.9* 8.0* 8.6 8.8   Liver Function Tests:  Recent Labs Lab 07/25/14 0105 07/25/14 0840  AST 31 22  ALT 51 40  ALKPHOS 52 41  BILITOT 0.7 0.7  PROT 6.6 5.5*  ALBUMIN 3.5 2.7*   CBC:  Recent Labs Lab 07/25/14 0105 07/25/14 0840 07/26/14 0402 07/27/14 0536  WBC 23.0* 35.2* 19.8* 9.3  NEUTROABS 20.1* 31.3*  --   --   HGB 15.1 13.4 13.0 13.3  HCT 44.6 39.6 38.1* 40.1  MCV 92.7 93.2 93.2 93.7  PLT 293 244 219 232   Cardiac Enzymes:  Recent Labs Lab 07/25/14 0840  CKTOTAL 168  TROPONINI <0.30   CBG:  Recent Labs Lab  07/25/14 0148  GLUCAP 111*    Recent Results (from the past 240 hour(s))  Blood culture (routine x 2)     Status: None (Preliminary result)   Collection Time: 07/25/14  1:21 AM  Result Value Ref Range Status   Specimen Description BLOOD LEFT HAND  Final   Special Requests BOTTLES DRAWN AEROBIC AND ANAEROBIC 5CC  Final   Culture  Setup Time   Final    07/25/2014 04:44 Performed at [facility]    Culture   Final           BLOOD CULTURE RECEIVED NO GROWTH TO DATE CULTURE WILL BE HELD FOR 5 DAYS BEFORE ISSUING A FINAL NEGATIVE REPORT Performed at [facility]    Report Status PENDING  Incomplete  Blood culture (routine x 2)     Status: None (Preliminary result)   Collection Time: 07/25/14  1:31 AM  Result Value Ref Range Status   Specimen Description BLOOD  RIGHT FOREARM  Final   Special Requests BOTTLES DRAWN AEROBIC AND ANAEROBIC 3CC  Final   Culture  Setup Time   Final    07/25/2014 04:44 Performed at [facility]    Culture   Final           BLOOD CULTURE RECEIVED NO GROWTH TO DATE CULTURE WILL BE HELD FOR 5 DAYS BEFORE ISSUING A FINAL NEGATIVE REPORT Performed at [facility]    Report Status PENDING  Incomplete  Urine culture     Status: None   Collection Time: 07/25/14  5:37 AM  Result Value Ref Range Status   Specimen Description URINE, RANDOM  Final   Special Requests NONE  Final   Culture  Setup Time   Final    07/25/2014 09:00 Performed at [facility]    Colony Count   Final    15,000 COLONIES/ML Performed at [facility]    Culture   Final    Multiple bacterial morphotypes present, none predominant. Suggest appropriate recollection if clinically indicated. Performed at [facility]    Report Status 07/26/2014 FINAL  Final  MRSA PCR Screening     Status: None   Collection Time: 07/25/14  8:11 AM  Result Value Ref Range Status   MRSA by PCR NEGATIVE NEGATIVE Final    Comment:        The GeneXpert MRSA Assay (FDA approved for NASAL specimens only), is one component of a comprehensive MRSA colonization surveillance program. It is not intended to diagnose MRSA infection nor to guide or monitor treatment for MRSA infections.   Clostridium Difficile by PCR     Status: None   Collection Time: 07/26/14  6:31 AM  Result Value Ref Range Status   C difficile by pcr NEGATIVE NEGATIVE Final    Comment: Performed at [facility]      Studies/Results: No results found.  Medications:  Scheduled: . enoxaparin (LOVENOX) injection  90 mg Subcutaneous Q24H  . levofloxacin (LEVAQUIN) IV  750 mg Intravenous Q24H  . saccharomyces boulardii  250 mg Oral BID  . simvastatin  10 mg Oral q1800  . sodium chloride  10-40 mL Intracatheter Q12H  . vancomycin  1,500 mg Intravenous  Q12H   Continuous:   [id number]:acetaminophen **OR** acetaminophen, loperamide, ondansetron **OR** ondansetron (ZOFRAN) IV, sodium chloride  Assessment/Plan:  Principal Problem:   Sepsis Active Problems:   Essential hypertension, benign   Hyperlipidemia   Severe sepsis   Cellulitis of leg, left  Abnormal urinalysis    Acute Diarrhea Negative C. difficile. This is most likely due to antibiotics. Imodium as needed. Continue Florastor   Sepsis/Possible Septic Shock  Much improved. Patient was profoundly hypotensive when he was initially evaluated. He required multiple fluid boluses with which his blood pressure has finally stabilized. His lactic acid is normal. CCM was briefly involved. Blood cultures are pending. Likely source is left lower extremity cellulitis. WBC continues to improve. Blood pressures stable.  Abnormal UA with a history of nephrolithiasis. CT scan of the abdomen pelvis was obtained and report is as above. Mild left hydronephrosis is noted. Continue to monitor his urine output and his creatinine. This will require further workup as an outpatient unless his renal function gets worse. No clear-cut obstructing stone was noted. Urine cultures negative  Cellulitis of the left lower extremity. Seems to be improving. Blood cultures are negative. Continue vancomycin and Levaquin. No DVT noted on Doppler study. Possible transition to oral antibiotics in the morning.  History of hypertension Holding his oral agents due to hypotension.  Hyperlipidemia Continue statins.  Hypokalemia. This will be repleted.  DVT Prophylaxis: Lovenox    Code Status: Full code  Family Communication: Discussed with the patient and his mother  Disposition Plan: Mobilize. PICC line had to be placed due to poor venous access. Anticipate discharge in 1-2 days. He will need a note for work.    LOS: 3 days   [facility],Jahdai Padovano  [facility] Pager [phone] 07/28/2014, 9:12 AM  If 8PM-8AM,  please contact night-coverage at [email or web address], password [email or web address]

## 2014-07-28 NOTE — Progress Notes (Signed)
ANTIBIOTIC CONSULT NOTE - FOLLOW UP  Pharmacy Consult for Vancomycin and Levaquin Indication: Sepsis, cellulitis  Allergies  Allergen Reactions  . Cephalexin Nausea And Vomiting  . Penicillins Nausea And Vomiting and Rash    Patient Measurements: Height: 5\' 8"  (172.7 cm) Weight: (!) 414 lb 14.4 oz (188.197 kg) IBW/kg (Calculated) : 68.4  Vital Signs: Temp: 97.5 F (36.4 C) (11/22 1405) Temp Source: Oral (11/22 1405) BP: 120/73 mmHg (11/22 1405) Pulse Rate: 98 (11/22 1405) Intake/Output from previous day: 11/21 0701 - 11/22 0700 In: 1990 [P.O.:840; IV Piggyback:1150] Out: -  Intake/Output from this shift: Total I/O In: 360 [P.O.:360] Out: -   Labs:  Recent Labs  07/26/14 0402 07/27/14 0536 07/28/14 0552  WBC 19.8* 9.3  --   HGB 13.0 13.3  --   PLT 219 232  --   CREATININE 1.03 0.88 0.80   Estimated Creatinine Clearance: 205.9 mL/min (by C-G formula based on Cr of 0.8). No results for input(s): VANCOTROUGH, VANCOPEAK, VANCORANDOM, GENTTROUGH, GENTPEAK, GENTRANDOM, TOBRATROUGH, TOBRAPEAK, TOBRARND, AMIKACINPEAK, AMIKACINTROU, AMIKACIN in the last 72 hours.   Microbiology: Recent Results (from the past 720 hour(s))  Blood culture (routine x 2)     Status: None (Preliminary result)   Collection Time: 07/25/14  1:21 AM  Result Value Ref Range Status   Specimen Description BLOOD LEFT HAND  Final   Special Requests BOTTLES DRAWN AEROBIC AND ANAEROBIC 5CC  Final   Culture  Setup Time   Final    07/25/2014 04:44 Performed at [facility]    Culture   Final           BLOOD CULTURE RECEIVED NO GROWTH TO DATE CULTURE WILL BE HELD FOR 5 DAYS BEFORE ISSUING A FINAL NEGATIVE REPORT [facility]formed at [facility]    Report Status PENDING  Incomplete  Blood culture (routine x 2)     Status: None (Preliminary result)   Collection Time: 07/25/14  1:31 AM  Result Value Ref Range Status   Specimen Description BLOOD RIGHT FOREARM  Final   Special Requests BOTTLES  DRAWN AEROBIC AND ANAEROBIC 3CC  Final   Culture  Setup Time   Final    07/25/2014 04:44 Performed at [facility]    Culture   Final           BLOOD CULTURE RECEIVED NO GROWTH TO DATE CULTURE WILL BE HELD FOR 5 DAYS BEFORE ISSUING A FINAL NEGATIVE REPORT Performed at [facility]    Report Status PENDING  Incomplete  Urine culture     Status: None   Collection Time: 07/25/14  5:37 AM  Result Value Ref Range Status   Specimen Description URINE, RANDOM  Final   Special Requests NONE  Final   Culture  Setup Time   Final    07/25/2014 09:00 Performed at [facility]    Colony Count   Final    15,000 COLONIES/ML Performed at [facility]    Culture   Final    Multiple bacterial morphotypes present, none predominant. Suggest appropriate recollection if clinically indicated. Performed at [facility]    Report Status 07/26/2014 FINAL  Final  MRSA PCR Screening     Status: None   Collection Time: 07/25/14  8:11 AM  Result Value Ref Range Status   MRSA by PCR NEGATIVE NEGATIVE Final    Comment:        The GeneXpert MRSA Assay (FDA approved for NASAL specimens only), is one component of  a comprehensive MRSA colonization surveillance program. It is not intended to diagnose MRSA infection nor to guide or monitor treatment for MRSA infections.   Clostridium Difficile by PCR     Status: None   Collection Time: 07/26/14  6:31 AM  Result Value Ref Range Status   C difficile by pcr NEGATIVE NEGATIVE Final    Comment: [facility]    Assessment: [age] yoM states that when he got home from work he was unable to get warm. He states his temp was 102.9 F and his heart was racing. Hypotensive in ED. CXR w/o active cardiopulmonary disease. Azactam/Levaquin and Vancomycin per Rx for Sepsis.   11/19 >>azactam >> 11/21 11/19 >>levaquin >>  11/19 >>vancomycin >>  Tmax: Afebrile WBCs: 9.3 (11/21) much improved Renal: SCr  improving to 0.8 (baseline SCr 0.85 in May), CrCl >100 ml/min Lactic Acid: 2.2 > 1.97 (11/19)  11/19 blood x2: ngtd 11/19 urine: 15k multiple bacteria 11/20 Cdiff: neg  Drug level / dose changes info: 11/22 VT at 20:00 = ____ before 7th total dose  Goal of Therapy:  Vancomycin trough level 15-20 mcg/ml  Levaquin dose per indication/renal function  Plan:   Continue vancomycin 1500mg  IV q12h - check trough tonight  Continue levaquin 750mg  IV q24h Follow up renal function & cultures, de-escalation  Loralee PacasErin Jabez Molner, PharmD, BCPS Pager: [phone] 07/28/2014,2:28 PM

## 2014-07-28 NOTE — Progress Notes (Signed)
PHARMACY - VANCOMYCIN (brief note)  Vancomycin Trough = 8.8 mcg/ml (goal 15-20 mcg/ml) on a regimen of Vancomycin 1500mg  IV q12h for sepsis/cellulitis  Also on Levaquin  Assessment:  Vanc trough level SUBtherapeutic  Plan:  Increase Vancomycin to 1750mg  IV q12h          Terrilee FilesLeann Rolla Servidio, PharmD 07/28/14 @ 23:38

## 2014-07-29 DIAGNOSIS — N1330 Unspecified hydronephrosis: Secondary | ICD-10-CM | POA: Diagnosis present

## 2014-07-29 LAB — BASIC METABOLIC PANEL
Anion gap: 11 (ref 5–15)
BUN: 11 mg/dL (ref 6–23)
CO2: 29 meq/L (ref 19–32)
Calcium: 8.7 mg/dL (ref 8.4–10.5)
Chloride: 104 mEq/L (ref 96–112)
Creatinine, Ser: 0.84 mg/dL (ref 0.50–1.35)
GFR calc Af Amer: >90 mL/min (ref >90)
GLUCOSE: 88 mg/dL (ref 70–99)
POTASSIUM: 3.6 meq/L — AB (ref 3.7–5.3)
Sodium: 144 mEq/L (ref 137–147)

## 2014-07-29 LAB — CBC
HCT: 38.4 % — ABNORMAL LOW (ref 39.0–52.0)
HEMOGLOBIN: 12.8 g/dL — AB (ref 13.0–17.0)
MCH: 31.4 pg (ref 26.0–34.0)
MCHC: 33.3 g/dL (ref 30.0–36.0)
MCV: 94.3 fL (ref 78.0–100.0)
Platelets: 220 10*3/uL (ref 150–400)
RBC: 4.07 MIL/uL — ABNORMAL LOW (ref 4.22–5.81)
RDW: 14.5 % (ref 11.5–15.5)
WBC: 7.4 10*3/uL (ref 4.0–10.5)

## 2014-07-29 MED ORDER — POTASSIUM CHLORIDE CRYS ER 20 MEQ PO TBCR
40.0000 meq | EXTENDED_RELEASE_TABLET | Freq: Once | ORAL | Status: AC
  Administered 2014-07-29: 40 meq via ORAL
  Filled 2014-07-29: qty 2

## 2014-07-29 MED ORDER — DOXYCYCLINE HYCLATE 100 MG PO TABS
100.0000 mg | ORAL_TABLET | Freq: Two times a day (BID) | ORAL | Status: DC
  Administered 2014-07-29 – 2014-07-30 (×3): 100 mg via ORAL
  Filled 2014-07-29 (×4): qty 1

## 2014-07-29 MED ORDER — LEVOFLOXACIN 500 MG PO TABS
500.0000 mg | ORAL_TABLET | Freq: Every day | ORAL | Status: DC
  Administered 2014-07-30: 500 mg via ORAL
  Filled 2014-07-29: qty 1

## 2014-07-29 NOTE — Plan of Care (Signed)
Problem: Phase II Progression Outcomes Goal: Progress activity as tolerated unless otherwise ordered Outcome: Progressing Goal: Discharge plan established Outcome: Progressing Goal: IV changed to normal saline lock Outcome: Progressing  Problem: Phase III Progression Outcomes Goal: Activity at appropriate level-compared to baseline (UP IN CHAIR FOR HEMODIALYSIS)  Outcome: Progressing Goal: Voiding independently Outcome: Progressing

## 2014-07-29 NOTE — Progress Notes (Signed)
[facility] PROGRESS NOTE  Corey Mylaraul Goldner WGN:[id number] DOB: 12/21/1975 DOA: 07/25/2014  PCP: Rudi HeapMOORE, DONALD, MD  Brief HPI: Corey Wilcox is a 38 y.o. male with history of hypertension and hyperlipidemia, morbidly obese who presented to the ER because of fever and chills. He had reported the symptoms of dysuria a few days ago. He also had lower back pain more towards the right side. When he was evaluated in the emergency department, patient was found to be febrile and hypotensive. He was subsequently admitted to the hospital.  Past medical history:  Past Medical History  Diagnosis Date  . Palpitations   . Hyperplasia of prostate   . Other and unspecified hyperlipidemia   . Obesity   . Fatty liver   . Essential hypertension, benign     Consultants: PCCM has signed off  Procedures:  Left Leg Venous Doppler No DVT  PICC line placed 11/21  Antibiotics: Levaquin/Vanc 11/19-->11/23 Aztreonam 11/19--11/21 Levaquin and Doxy 11/23-->  Subjective: Patient continues to feel better. Diarrhea has resolved. Left leg is improved.   Objective: Vital Signs  Filed Vitals:   07/28/14 0513 07/28/14 1405 07/28/14 2228 07/29/14 0537  BP: 136/81 120/73 140/79 155/99  Pulse: 93 98 92 85  Temp: 97.8 F (36.6 C) 97.5 F (36.4 C) 98.4 F (36.9 C) 98.1 F (36.7 C)  TempSrc: Oral Oral Oral Oral  Resp: 16 16 20 16   Height:      Weight: 188.197 kg (414 lb 14.4 oz)   187 kg (412 lb 4.2 oz)  SpO2: 98% 100% 97% 97%    Intake/Output Summary (Last 24 hours) at 07/29/14 1004 Last data filed at 07/29/14 [id number]  Gross per 24 hour  Intake    390 ml  Output      0 ml  Net    390 ml   Filed Weights   07/27/14 0443 07/28/14 0513 07/29/14 0537  Weight: 187.971 kg (414 lb 6.4 oz) 188.197 kg (414 lb 14.4 oz) 187 kg (412 lb 4.2 oz)    General appearance: alert, cooperative, appears stated age, no distress and morbidly obese Resp: clear to auscultation bilaterally Cardio: regular rate  and rhythm, S1, S2 normal, no murmur, click, rub or gallop GI: soft, non-tender; bowel sounds normal; no masses,  no organomegaly. Morbidly obese Extremities:  Much improved erythema in the left lower extremity. No longer warm to touch   Lab Results:  Basic Metabolic Panel:  Recent Labs Lab 07/25/14 0840 07/26/14 0402 07/27/14 0536 07/28/14 0552 07/29/14 0530  NA 139 138 139 143 144  K 4.3 3.4* 3.3* 3.7 3.6*  CL 103 100 103 104 104  CO2 23 25 26 28 29   GLUCOSE 106* 95 80 84 88  BUN 24* 17 12 12 11   CREATININE 1.43* 1.03 0.88 0.80 0.84  CALCIUM 7.9* 8.0* 8.6 8.8 8.7   Liver Function Tests:  Recent Labs Lab 07/25/14 0105 07/25/14 0840  AST 31 22  ALT 51 40  ALKPHOS 52 41  BILITOT 0.7 0.7  PROT 6.6 5.5*  ALBUMIN 3.5 2.7*   CBC:  Recent Labs Lab 07/25/14 0105 07/25/14 0840 07/26/14 0402 07/27/14 0536 07/29/14 0530  WBC 23.0* 35.2* 19.8* 9.3 7.4  NEUTROABS 20.1* 31.3*  --   --   --   HGB 15.1 13.4 13.0 13.3 12.8*  HCT 44.6 39.6 38.1* 40.1 38.4*  MCV 92.7 93.2 93.2 93.7 94.3  PLT 293 244 219 232 220   Cardiac Enzymes:  Recent Labs Lab 07/25/14 0840  CKTOTAL  168  TROPONINI <0.30   CBG:  Recent Labs Lab 07/25/14 0148  GLUCAP 111*    Recent Results (from the past 240 hour(s))  Blood culture (routine x 2)     Status: None (Preliminary result)   Collection Time: 07/25/14  1:21 AM  Result Value Ref Range Status   Specimen Description BLOOD LEFT HAND  Final   Special Requests BOTTLES DRAWN AEROBIC AND ANAEROBIC 5CC  Final   Culture  Setup Time   Final    07/25/2014 04:44 Performed at [facility]    Culture   Final           BLOOD CULTURE RECEIVED NO GROWTH TO DATE CULTURE WILL BE HELD FOR 5 DAYS BEFORE ISSUING A FINAL NEGATIVE REPORT Performed at [facility]    Report Status PENDING  Incomplete  Blood culture (routine x 2)     Status: None (Preliminary result)   Collection Time: 07/25/14  1:31 AM  Result Value Ref Range  Status   Specimen Description BLOOD RIGHT FOREARM  Final   Special Requests BOTTLES DRAWN AEROBIC AND ANAEROBIC 3CC  Final   Culture  Setup Time   Final    07/25/2014 04:44 Performed at [facility]    Culture   Final           BLOOD CULTURE RECEIVED NO GROWTH TO DATE CULTURE WILL BE HELD FOR 5 DAYS BEFORE ISSUING A FINAL NEGATIVE REPORT Performed at [facility]    Report Status PENDING  Incomplete  Urine culture     Status: None   Collection Time: 07/25/14  5:37 AM  Result Value Ref Range Status   Specimen Description URINE, RANDOM  Final   Special Requests NONE  Final   Culture  Setup Time   Final    07/25/2014 09:00 Performed at [facility] Count   Final    15,000 COLONIES/ML Performed at [facility]    Culture   Final    Multiple bacterial morphotypes present, none predominant. Suggest appropriate recollection if clinically indicated. Performed at [facility]    Report Status 07/26/2014 FINAL  Final  MRSA PCR Screening     Status: None   Collection Time: 07/25/14  8:11 AM  Result Value Ref Range Status   MRSA by PCR NEGATIVE NEGATIVE Final    Comment:        The GeneXpert MRSA Assay (FDA approved for NASAL specimens only), is one component of a comprehensive MRSA colonization surveillance program. It is not intended to diagnose MRSA infection nor to guide or monitor treatment for MRSA infections.   Clostridium Difficile by PCR     Status: None   Collection Time: 07/26/14  6:31 AM  Result Value Ref Range Status   C difficile by pcr NEGATIVE NEGATIVE Final    Comment: Performed at [facility]      Studies/Results: No results found.  Medications:  Scheduled: . doxycycline  100 mg Oral Q12H  . enoxaparin (LOVENOX) injection  90 mg Subcutaneous Q24H  . levofloxacin  500 mg Oral Daily  . saccharomyces boulardii  250 mg Oral BID  . simvastatin  10 mg Oral q1800  . sodium chloride  10-40 mL  Intracatheter Q12H   Continuous:   JYN:[id number] **OR** acetaminophen, cetaphil, loperamide, ondansetron **OR** ondansetron (ZOFRAN) IV, sodium chloride  Assessment/Plan:  Principal Problem:   Sepsis Active Problems:   Essential hypertension, benign   Hyperlipidemia  Severe sepsis   Cellulitis of leg, left   Abnormal urinalysis    Acute Diarrhea Negative C. difficile. This is most likely due to antibiotics. Imodium as needed. Continue Florastor   Sepsis/Possible Septic Shock  Much improved. Patient was profoundly hypotensive when he was initially evaluated. He required multiple fluid boluses with which his blood pressure has finally stabilized. His lactic acid is normal. CCM was briefly involved. Blood cultures are pending. Likely source is left lower extremity cellulitis. WBC continues to improve. Blood pressures stable.  Abnormal UA with a history of nephrolithiasis/left hydronephrosis CT scan of the abdomen pelvis was obtained and report is as above. Mild left hydronephrosis is noted. Continue to monitor his urine output and his creatinine. This will require further workup as an outpatient unless his renal function gets worse. No clear-cut obstructing stone was noted. Urine cultures negative  Cellulitis of the left lower extremity. Much improved. Blood cultures are negative. Change to oral doxycycline and Levaquin. No DVT noted on Doppler study.   History of hypertension Blood pressure improved. Holding his oral agents due to initial condition.  Hyperlipidemia Continue statins.  Hypokalemia. This will be repleted.  DVT Prophylaxis: Lovenox    Code Status: Full code  Family Communication: Discussed with the patient and his mother  Disposition Plan: Mobilize. Anticipate discharge in the morning. He will need a note for work.    LOS: 4 days   [facility] Center[facility]ISHNAN,Kellie Chisolm  [facility] Pager [phone] 07/29/2014, 10:04 AM  If 8PM-8AM, please contact night-coverage at  [email or web address], password [email or web address]

## 2014-07-30 ENCOUNTER — Telehealth: Payer: Self-pay | Admitting: Family Medicine

## 2014-07-30 MED ORDER — LEVOFLOXACIN 500 MG PO TABS: 500.0000 mg | ORAL_TABLET | Freq: Every day | ORAL | Status: DC

## 2014-07-30 MED ORDER — SACCHAROMYCES BOULARDII 250 MG PO CAPS: 250.0000 mg | ORAL_CAPSULE | Freq: Two times a day (BID) | ORAL | Status: DC

## 2014-07-30 MED ORDER — DOXYCYCLINE HYCLATE 100 MG PO TABS: 100.0000 mg | ORAL_TABLET | Freq: Two times a day (BID) | ORAL | Status: DC

## 2014-07-30 NOTE — Telephone Encounter (Signed)
Patient states that he wants to see Dr. Christell ConstantMoore and that he use to see Dr. Christell ConstantMoore in the past and has seen him his whole life. Patient also states he wants Dr. Christell ConstantMoore to call him. Patient last saw Dr. Christell ConstantMoore in 2011.

## 2014-07-30 NOTE — Discharge Summary (Addendum)
[facility]  Physician Discharge Summary   Patient ID: Corey Wilcox MRN: [id number] DOB/AGE: July 28, 1976 38 y.o.  Admit date: 07/25/2014 Discharge date: 07/30/2014  PCP: Rudi Heap, MD  DISCHARGE DIAGNOSES:  Principal Problem:   Sepsis Active Problems:   Essential hypertension, benign   Hyperlipidemia   Severe sepsis   Cellulitis of leg, left   Abnormal urinalysis   Hydronephrosis, left   RECOMMENDATIONS FOR OUTPATIENT FOLLOW UP: 1. Closed follow-up with his PCP. 2. Consider outpatient Urology consult for mild left hydronephrosis and nephrolithiasis  DISCHARGE CONDITION: fair  Diet recommendation: Low sodium  Filed Weights   07/28/14 0513 07/29/14 0537 07/30/14 0440  Weight: 188.197 kg (414 lb 14.4 oz) 187 kg (412 lb 4.2 oz) 184.886 kg (407 lb 9.6 oz)    INITIAL HISTORY: Corey Wilcox is a 38 y.o. male with history of hypertension and hyperlipidemia, morbidly obese who presented to the ER because of fever and chills. He had reported the symptoms of dysuria a few days prior. He also had lower back pain more towards the right side. When he was evaluated in the emergency department, patient was found to be febrile and hypotensive. He was subsequently admitted to the hospital  Consultations:  Critical care medicine  Procedures:  Left Leg Venous Doppler No DVT  PICC line placed 11/21  Antibiotics: Levaquin/Vanc 11/19-->11/23 Aztreonam 11/19--11/21 Levaquin and Doxy 11/23-->  HOSPITAL COURSE:   Sepsis/Possible Septic Shock  This was secondary to cellulitis of his left lower extremity. His UA was also abnormal. But urine cultures did not grow any organism. He was however noted to have significant erythema of the left lower extremity. He was started on broad-spectrum antibiotics. Cultures were all negative. Antibiotic spectrum was slowly narrowed. He was initially hypotensive and cardiac. With fluids he improved. Did not require any pressors.  Subsequently, was transferred to the floor from the stepdown unit.  Cellulitis of the left lower extremity. Initially, the erythema spread. However, after about 48-72 hours. It started improving. And now is almost resolved. Blood cultures are negative. He was changed over to oral doxycycline and Levaquin. No DVT noted on Doppler study.  Acute Diarrhea He developed diarrhea while on antibiotics. C. difficile was negative. Diarrhea improved as his antibiotics were deescalated. He was also started on Florastor and Imodium as needed.  Abnormal UA with a history of nephrolithiasis/left hydronephrosis CT scan of the abdomen pelvis was obtained and report is as below. Mild left hydronephrosis is noted. He had good urine output. His renal function was normal. This will require further workup as an outpatient. No clear-cut obstructing stone was noted. Urine cultures negative. The abnormal CT findings were communicated with the patient and her mother.  History of hypertension Initially had hypotension. So his medications were held. These may be resumed at home.  Hyperlipidemia Continue statins.  Hypokalemia. This was repleted.  Overall, patient is improved. He has been ambulating. He is stable for discharge. PICC line was removed prior to discharge.   PERTINENT LABS:  The results of significant diagnostics from this hospitalization (including imaging, microbiology, ancillary and laboratory) are listed below for reference.    Microbiology: Recent Results (from the past 240 hour(s))  Blood culture (routine x 2)     Status: None (Preliminary result)   Collection Time: 07/25/14  1:21 AM  Result Value Ref Range Status   Specimen Description BLOOD LEFT HAND  Final   Special Requests BOTTLES DRAWN AEROBIC AND ANAEROBIC 5CC  Final   Culture  Setup Time  Final    07/25/2014 04:44 Performed at [facility]    Culture   Final           BLOOD CULTURE RECEIVED NO GROWTH TO DATE CULTURE WILL  BE HELD FOR 5 DAYS BEFORE ISSUING A FINAL NEGATIVE REPORT Performed at [facility]    Report Status PENDING  Incomplete  Blood culture (routine x 2)     Status: None (Preliminary result)   Collection Time: 07/25/14  1:31 AM  Result Value Ref Range Status   Specimen Description BLOOD RIGHT FOREARM  Final   Special Requests BOTTLES DRAWN AEROBIC AND ANAEROBIC 3CC  Final   Culture  Setup Time   Final    07/25/2014 04:44 Performed at [facility]    Culture   Final           BLOOD CULTURE RECEIVED NO GROWTH TO DATE CULTURE WILL BE HELD FOR 5 DAYS BEFORE ISSUING A FINAL NEGATIVE REPORT Performed at [facility]    Report Status PENDING  Incomplete  Urine culture     Status: None   Collection Time: 07/25/14  5:37 AM  Result Value Ref Range Status   Specimen Description URINE, RANDOM  Final   Special Requests NONE  Final   Culture  Setup Time   Final    07/25/2014 09:00 Performed at [facility]    Colony Count   Final    15,000 COLONIES/ML Performed at [facility]    Culture   Final    Multiple bacterial morphotypes present, none predominant. Suggest appropriate recollection if clinically indicated. Performed at [facility]    Report Status 07/26/2014 FINAL  Final  MRSA PCR Screening     Status: None   Collection Time: 07/25/14  8:11 AM  Result Value Ref Range Status   MRSA by PCR NEGATIVE NEGATIVE Final    Comment:        The GeneXpert MRSA Assay (FDA approved for NASAL specimens only), is one component of a comprehensive MRSA colonization surveillance program. It is not intended to diagnose MRSA infection nor to guide or monitor treatment for MRSA infections.   Clostridium Difficile by PCR     Status: None   Collection Time: 07/26/14  6:31 AM  Result Value Ref Range Status   C difficile by pcr NEGATIVE NEGATIVE Final    Comment: Performed at [facility]     Labs: Basic Metabolic Panel:  Recent  Labs Lab 07/25/14 0840 07/26/14 0402 07/27/14 0536 07/28/14 0552 07/29/14 0530  NA 139 138 139 143 144  K 4.3 3.4* 3.3* 3.7 3.6*  CL 103 100 103 104 104  CO2 23 25 26 28 29   GLUCOSE 106* 95 80 84 88  BUN 24* 17 12 12 11   CREATININE 1.43* 1.03 0.88 0.80 0.84  CALCIUM 7.9* 8.0* 8.6 8.8 8.7   Liver Function Tests:  Recent Labs Lab 07/25/14 0105 07/25/14 0840  AST 31 22  ALT 51 40  ALKPHOS 52 41  BILITOT 0.7 0.7  PROT 6.6 5.5*  ALBUMIN 3.5 2.7*   CBC:  Recent Labs Lab 07/25/14 0105 07/25/14 0840 07/26/14 0402 07/27/14 0536 07/29/14 0530  WBC 23.0* 35.2* 19.8* 9.3 7.4  NEUTROABS 20.1* 31.3*  --   --   --   HGB 15.1 13.4 13.0 13.3 12.8*  HCT 44.6 39.6 38.1* 40.1 38.4*  MCV 92.7 93.2 93.2 93.7 94.3  PLT 293 244 219 232 220   Cardiac Enzymes:  Recent Labs Lab 07/25/14 0840  CKTOTAL 168  TROPONINI <0.30   CBG:  Recent Labs Lab 07/25/14 0148  GLUCAP 111*     IMAGING STUDIES Ct Abdomen Pelvis Wo Contrast  07/25/2014   CLINICAL DATA:  Nephrolithiasis, right flank pain and dysuria for several days prior to admission, fever, sepsis  EXAM: CT ABDOMEN AND PELVIS WITHOUT CONTRAST  TECHNIQUE: Multidetector CT imaging of the abdomen and pelvis was performed following the standard protocol without IV contrast.  COMPARISON:  None.  FINDINGS: There are extensive streak artifacts from patient's large body habitus which limits examination.  Sagittal images of the spine shows mild degenerative changes lower thoracic and lumbar spine. The lung bases are unremarkable.  There is fatty infiltration of the liver. No calcified gallstones are noted within gallbladder. No intrahepatic biliary ductal dilatation. The pancreas, spleen and adrenal glands are unremarkable. Kidneys shows a punctate nonobstructive calcified calculus in midpole of the right kidney posterior aspect measures 2 mm. No left renal calcifications are noted. There is mild left hydronephrosis. Cortical thinning is  noted left kidney. Left UPJ stricture cannot be excluded. No obstructive calcified calculi are identified. No hydroureter bilaterally. No calcified ureteral calculi bilaterally.  No aortic aneurysm. No small bowel obstruction. Normal appendix. No pericecal inflammation. Bilateral distal ureter is unremarkable. The urinary bladder is under distended grossly unremarkable.  The terminal ileum is unremarkable. No colonic obstruction. No ascites or free air. No adenopathy. No destructive bony lesions are noted within pelvis.  IMPRESSION: 1. Limited study by streaky artifacts from patient's large body habitus. There is a tiny 2 mm nonobstructive calcified calculus within mid pole of the right kidney. 2. Mild left hydronephrosis. No obstructive calcified calculus is noted. UPJ stricture cannot be excluded. Correlation with urology exam is recommended. Mild left renal cortical thinning. 3. Contracted gallbladder without evidence of calcified gallstones. 4. No calcified ureteral calculi are noted bilaterally. 5. No pericecal inflammation.  Normal appendix.   Electronically Signed   By: Natasha Mead M.D.   On: 07/25/2014 12:13   Dg Chest 2 View  07/25/2014   CLINICAL DATA:  Sudden onset of fever and chills. Patient fills week. Fever and cough.  EXAM: CHEST  2 VIEW  COMPARISON:  None.  FINDINGS: The heart size and mediastinal contours are within normal limits. Both lungs are clear. The visualized skeletal structures are unremarkable.  IMPRESSION: No active cardiopulmonary disease.   Electronically Signed   By: Burman Nieves M.D.   On: 07/25/2014 01:50    DISCHARGE EXAMINATION: Filed Vitals:   07/29/14 0537 07/29/14 1500 07/29/14 2037 07/30/14 0440  BP: 155/99 143/79 149/83 146/92  Pulse: 85 84 98 87  Temp: 98.1 F (36.7 C) 98.3 F (36.8 C) 98.3 F (36.8 C) 98.2 F (36.8 C)  TempSrc: Oral Oral Oral Oral  Resp: 16 18 18 16   Height:      Weight: 187 kg (412 lb 4.2 oz)   184.886 kg (407 lb 9.6 oz)  SpO2: 97%  97% 94% 97%   General appearance: alert, cooperative, appears stated age and no distress Resp: clear to auscultation bilaterally Cardio: regular rate and rhythm, S1, S2 normal, no murmur, click, rub or gallop GI: soft, non-tender; bowel sounds normal; no masses,  no organomegaly Extremities: Erythema of the left lower extremity is significantly improved. No longer warm to touch.  DISPOSITION: Home with mother  Discharge Instructions    Call MD for:  extreme fatigue    Complete by:  As directed  Call MD for:  persistant dizziness or light-headedness    Complete by:  As directed      Call MD for:  severe uncontrolled pain    Complete by:  As directed      Call MD for:  temperature >100.4    Complete by:  As directed      Diet - low sodium heart healthy    Complete by:  As directed      Discharge instructions    Complete by:  As directed   Please follow up with your PCP in 1 week.     Increase activity slowly    Complete by:  As directed            ALLERGIES:  Allergies  Allergen Reactions  . Cephalexin Nausea And Vomiting  . Penicillins Nausea And Vomiting and Rash    Discharge Medication List as of 07/30/2014 11:15 AM    START taking these medications   Details  doxycycline (VIBRA-TABS) 100 MG tablet Take 1 tablet (100 mg total) by mouth every 12 (twelve) hours. For 7 days, Starting 07/30/2014, Until Discontinued, Print    levofloxacin (LEVAQUIN) 500 MG tablet Take 1 tablet (500 mg total) by mouth daily. For 7 more days, Starting 07/30/2014, Until Discontinued, Print    saccharomyces boulardii (FLORASTOR) 250 MG capsule Take 1 capsule (250 mg total) by mouth 2 (two) times daily. For 7 days, Starting 07/30/2014, Until Discontinued, Print      CONTINUE these medications which have NOT CHANGED   Details  aspirin 325 MG tablet Take 325 mg by mouth once., Historical Med    cetirizine (ZYRTEC) 10 MG tablet Take 10 mg by mouth daily.  , Until Discontinued, Historical  Med    cholecalciferol (VITAMIN D) 1000 UNITS tablet Take 1,000 Units by mouth daily., Until Discontinued, Historical Med    olmesartan-hydrochlorothiazide (BENICAR HCT) 40-25 MG per tablet TAKE 1 TABLET BY MOUTH DAILY., Normal    simvastatin (ZOCOR) 10 MG tablet TAKE 1 TABLET (10 MG TOTAL) BY MOUTH AT BEDTIME., Normal      STOP taking these medications     CRESTOR 10 MG tablet        Follow-up Information    Follow up with Rudi HeapMOORE, DONALD, MD. Schedule an appointment as soon as possible for a visit in 1 week.   Specialty:  Family Medicine   Why:  post hospitalization follow up   Contact information:   [address] [place] [id number] [phone]       TOTAL DISCHARGE TIME: 35 mins.  [facility],Carmilla [place]  [facility] Pager [phone]  07/30/2014, 4:23 PM

## 2014-07-30 NOTE — Discharge Instructions (Signed)

## 2014-07-30 NOTE — Plan of Care (Signed)
Problem: Phase III Progression Outcomes Goal: Voiding independently Outcome: Completed/Met Date Met:  07/30/14

## 2014-07-30 NOTE — Progress Notes (Signed)
Discharge to home, d/c instructions and follow up appointment done and was given to the patient, verbalize understanding.  PICC line removed by IV RN, no s/s of bleeding noted upon discharged.

## 2014-07-30 NOTE — Plan of Care (Signed)
Problem: Phase II Progression Outcomes Goal: Progress activity as tolerated unless otherwise ordered Outcome: Completed/Met Date Met:  07/30/14 Goal: Discharge plan established Outcome: Completed/Met Date Met:  07/30/14 Goal: IV changed to normal saline lock Outcome: Completed/Met Date Met:  07/30/14

## 2014-07-31 LAB — CULTURE, BLOOD (ROUTINE X 2)
Culture: NO GROWTH
Culture: NO GROWTH

## 2014-07-31 NOTE — Telephone Encounter (Signed)
Pt aware of hosp f/u appt with Leonie GreenBill O  He is aware to set up regular appt that day with Christell ConstantMoore- so that he can start following him regularly.

## 2014-08-08 ENCOUNTER — Encounter: Payer: Self-pay | Admitting: Family Medicine

## 2014-08-08 ENCOUNTER — Ambulatory Visit (INDEPENDENT_AMBULATORY_CARE_PROVIDER_SITE_OTHER): Payer: 59 | Admitting: Family Medicine

## 2014-08-08 VITALS — BP 151/90 | HR 107 | Temp 96.0°F | Ht 68.0 in | Wt 395.0 lb

## 2014-08-08 DIAGNOSIS — I8310 Varicose veins of unspecified lower extremity with inflammation: Secondary | ICD-10-CM

## 2014-08-08 DIAGNOSIS — I872 Venous insufficiency (chronic) (peripheral): Secondary | ICD-10-CM

## 2014-08-08 MED ORDER — NYSTATIN 100000 UNIT/GM EX CREA: 1.0000 "application " | TOPICAL_CREAM | Freq: Two times a day (BID) | CUTANEOUS | Status: DC

## 2014-08-08 NOTE — Progress Notes (Signed)
   Subjective:    Patient ID: Corey Wilcox, male    DOB: 08/20/1976, 38 y.o.   MRN: [id number]  HPI Patient is here for follow up for recent admission for sepsis.  He was admitted for cellulitis and sepsis due to left lower extremity cellulitis.  He has hx of edema and venous stasis.  He is not using any venous compression stockings.    Review of Systems  Constitutional: Negative for fever.  HENT: Negative for ear pain.   Eyes: Negative for discharge.  Respiratory: Negative for cough.   Cardiovascular: Negative for chest pain.  Gastrointestinal: Negative for abdominal distention.  Endocrine: Negative for polyuria.  Genitourinary: Negative for difficulty urinating.  Musculoskeletal: Negative for gait problem and neck pain.  Skin: Negative for color change and rash.  Neurological: Negative for speech difficulty and headaches.  Psychiatric/Behavioral: Negative for agitation.       Objective:    BP 151/90 mmHg  Pulse 107  Temp(Src) 96 F (35.6 C)  Ht 5\' 8"  (1.727 m)  Wt 395 lb (179.171 kg)  BMI 60.07 kg/m2 Physical Exam  Constitutional: He is oriented to person, place, and time. He appears well-developed and well-nourished.  HENT:  Head: Normocephalic and atraumatic.  Mouth/Throat: Oropharynx is clear and moist.  Eyes: Pupils are equal, round, and reactive to light.  Neck: Normal range of motion. Neck supple.  Cardiovascular: Normal rate and regular rhythm.   No murmur heard. Pulmonary/Chest: Effort normal and breath sounds normal.  Abdominal: Soft. Bowel sounds are normal. There is no tenderness.  Neurological: He is alert and oriented to person, place, and time.  Skin: Skin is warm and dry.  Bilateral lower extremities with pre-tibial edema.  Psychiatric: He has a normal mood and affect.          Assessment & Plan:     ICD-9-CM ICD-10-CM   1. Venous stasis dermatitis, unspecified laterality 454.1 I83.10 nystatin cream (MYCOSTATIN)     No Follow-up on  file.  Deatra CanterWilliam J Luman Holway FNP

## 2014-09-04 ENCOUNTER — Other Ambulatory Visit: Payer: Self-pay | Admitting: Family Medicine

## 2014-09-23 ENCOUNTER — Ambulatory Visit (INDEPENDENT_AMBULATORY_CARE_PROVIDER_SITE_OTHER): Payer: 59 | Admitting: Family Medicine

## 2014-09-23 ENCOUNTER — Encounter: Payer: Self-pay | Admitting: Family Medicine

## 2014-09-23 DIAGNOSIS — I10 Essential (primary) hypertension: Secondary | ICD-10-CM

## 2014-09-23 DIAGNOSIS — E559 Vitamin D deficiency, unspecified: Secondary | ICD-10-CM

## 2014-09-23 DIAGNOSIS — E785 Hyperlipidemia, unspecified: Secondary | ICD-10-CM

## 2014-09-23 DIAGNOSIS — K760 Fatty (change of) liver, not elsewhere classified: Secondary | ICD-10-CM

## 2014-09-23 DIAGNOSIS — Z0000 Encounter for general adult medical examination without abnormal findings: Secondary | ICD-10-CM

## 2014-09-23 NOTE — Progress Notes (Signed)
Subjective:    Patient ID: Corey Wilcox, male    DOB: 08-31-1976, 39 y.o.   MRN: [id number]  HPI Patient is here today for annual wellness exam and follow up of chronic medical problems which include hypertension and hyperlipidemia. It is significant to note that this patient has morbid obesity. He was in the hospital in November for sepsis from an unknown organism. He is due today to have a FOBT he will return for lab work fasting. The patient does complain of some numbness to the right lateral thigh at times without pain. It was in the L4-L5 nerve root distribution. He also complains of some allergy and congestion and right ear pain.      Patient Active Problem List   Diagnosis Date Noted  . Hydronephrosis, left 07/29/2014  . Sepsis 07/25/2014  . Hyperlipidemia 07/25/2014  . Severe sepsis 07/25/2014  . Cellulitis of leg, left 07/25/2014  . Abnormal urinalysis 07/25/2014  . Palpitations   . Hyperplasia of prostate   . Other and unspecified hyperlipidemia   . Obesity   . Fatty liver   . Essential hypertension, benign    Outpatient Encounter Prescriptions as of 09/23/2014  Medication Sig  . cetirizine (ZYRTEC) 10 MG tablet Take 10 mg by mouth daily.    . cholecalciferol (VITAMIN D) 1000 UNITS tablet Take 1,000 Units by mouth daily.  Marland Kitchen olmesartan-hydrochlorothiazide (BENICAR HCT) 40-25 MG per tablet TAKE 1 TABLET BY MOUTH DAILY. (Patient taking differently: 0.5 tablets. TAKE 1 TABLET BY MOUTH DAILY.)  . simvastatin (ZOCOR) 10 MG tablet TAKE 1 TABLET (10 MG TOTAL) BY MOUTH AT BEDTIME.  Marland Kitchen aspirin 325 MG tablet Take 325 mg by mouth once.  . [DISCONTINUED] levofloxacin (LEVAQUIN) 500 MG tablet Take 1 tablet (500 mg total) by mouth daily. For 7 more days (Patient not taking: Reported on 08/08/2014)  . [DISCONTINUED] nystatin cream (MYCOSTATIN) Apply 1 application topically 2 (two) times daily.  . [DISCONTINUED] saccharomyces boulardii (FLORASTOR) 250 MG capsule Take 1 capsule (250 mg  total) by mouth 2 (two) times daily. For 7 days    Review of Systems  Constitutional: Negative.   HENT: Negative.        Seasonal allergies  Eyes: Negative.   Respiratory: Negative.   Cardiovascular: Negative.   Gastrointestinal: Negative.   Endocrine: Negative.   Genitourinary: Negative.   Musculoskeletal: Negative.   Skin: Negative.   Allergic/Immunologic: Negative.   Neurological: Negative.   Hematological: Negative.   Psychiatric/Behavioral: Negative.        Objective:   Physical Exam  Constitutional: He is oriented to person, place, and time. He appears well-developed and well-nourished.  The patient is morbidly obese. He is calm and quiet and usual does not seem to take his weight problems as seriously as he should.  HENT:  Head: Normocephalic and atraumatic.  Left Ear: External ear normal.  Nose: Nose normal.  Mouth/Throat: Oropharynx is clear and moist. No oropharyngeal exudate.  The right TM appears somewhat dull as if he has had a recent otitis media that is improving.  Eyes: Conjunctivae and EOM are normal. Pupils are equal, round, and reactive to light. Right eye exhibits no discharge. Left eye exhibits no discharge. No scleral icterus.  Neck: Normal range of motion. Neck supple. No thyromegaly present.  No carotid bruits or thyromegaly  Cardiovascular: Normal rate, regular rhythm, normal heart sounds and intact distal pulses.  Exam reveals no gallop and no friction rub.   No murmur heard. Distal pulses were a little  bit difficult to palpate especially on the left side. The heart had a regular rate and rhythm at 84/m  Pulmonary/Chest: Effort normal and breath sounds normal. No respiratory distress. He has no wheezes. He has no rales. He exhibits no tenderness.  Good breath sounds on inspiration and expiration  Abdominal: Soft. Bowel sounds are normal. He exhibits no mass. There is no tenderness. There is no rebound and no guarding.  Abdomen is morbidly obese and  slightly tender in the epigastric area to palpation.  Musculoskeletal: Normal range of motion. He exhibits no edema or tenderness.  Lymphadenopathy:    He has no cervical adenopathy.  Neurological: He is alert and oriented to person, place, and time. He has normal reflexes. No cranial nerve deficit.  Skin: Skin is warm and dry. No rash noted. No erythema. No pallor.  Psychiatric: He has a normal mood and affect. His behavior is normal. Judgment and thought content normal.  Nursing note and vitals reviewed.  BP 119/77 mmHg  Pulse 94  Temp(Src) 96.5 F (35.8 C) (Oral)  Ht 5' 8"  (1.727 m)  Wt 404 lb (183.253 kg)  BMI 61.44 kg/m2        Assessment & Plan:  1. Essential hypertension -Continue current blood pressure medication contingent upon lab work. - POCT CBC; Future - BMP8+EGFR; Future - Hepatic function panel; Future  2. Hyperlipidemia -Continue simvastatin contingent upon lab work results - POCT CBC; Future - NMR, lipoprofile; Future  3. Vitamin D deficiency -Continue vitamin D3 1000 contingent upon lab work results - POCT CBC; Future - Vit D  25 hydroxy (rtn osteoporosis monitoring); Future  4. Annual physical exam - POCT CBC; Future - PSA, total and free; Future - Thyroid Panel With TSH; Future - POCT urinalysis dipstick; Future - POCT UA - Microscopic Only; Future  5. Morbid obesity -Arrange an appointment with the clinical pharmacists at patient's discretion regarding help with weight loss  6. Fatty liver -Continue aggressive weight loss management and cholesterol medication  Patient Instructions  Continue current medications. Continue good therapeutic lifestyle changes which include good diet and exercise. Fall precautions discussed with patient. If an FOBT was given today- please return it to our front desk.   Flu Shots will be available at our office starting mid- September. Please call and schedule a FLU CLINIC APPOINTMENT.   The patient was  offered an opportunity to come and discuss with the clinical pharmacists more dietary options but he refused to do this at this time. If he continues to have problems with his back and numbness he understands to call us back and we will arrange to get LS spine films He should avoid NSAIDs because of his GI history. He needs to continue the work on his diet and weight loss. He should return the FOBT He needs to return to the office for fasting lab work   Arrie Senate MD

## 2014-09-23 NOTE — Patient Instructions (Addendum)
Continue current medications. Continue good therapeutic lifestyle changes which include good diet and exercise. Fall precautions discussed with patient. If an FOBT was given today- please return it to our front desk.   Flu Shots will be available at our office starting mid- September. Please call and schedule a FLU CLINIC APPOINTMENT.   The patient was offered an opportunity to come and discuss with the clinical pharmacists more dietary options but he refused to do this at this time. If he continues to have problems with his back and numbness he understands to call us back and we will arrange to get LS spine films He should avoid NSAIDs because of his GI history. He needs to continue the work on his diet and weight loss. He should return the FOBT He needs to return to the office for fasting lab work

## 2014-11-03 ENCOUNTER — Inpatient Hospital Stay (HOSPITAL_COMMUNITY)
Admission: EM | Admit: 2014-11-03 | Discharge: 2014-11-06 | DRG: 871 | Disposition: A | Discharge status: Home / Self Care | Payer: 59 | Attending: Internal Medicine | Admitting: Internal Medicine

## 2014-11-03 ENCOUNTER — Emergency Department (HOSPITAL_COMMUNITY): Payer: 59

## 2014-11-03 ENCOUNTER — Encounter (HOSPITAL_COMMUNITY): Payer: Self-pay | Admitting: Emergency Medicine

## 2014-11-03 ENCOUNTER — Inpatient Hospital Stay (HOSPITAL_COMMUNITY): Payer: 59

## 2014-11-03 DIAGNOSIS — J9601 Acute respiratory failure with hypoxia: Secondary | ICD-10-CM | POA: Diagnosis present

## 2014-11-03 DIAGNOSIS — E669 Obesity, unspecified: Secondary | ICD-10-CM

## 2014-11-03 DIAGNOSIS — K760 Fatty (change of) liver, not elsewhere classified: Secondary | ICD-10-CM | POA: Diagnosis present

## 2014-11-03 DIAGNOSIS — N400 Enlarged prostate without lower urinary tract symptoms: Secondary | ICD-10-CM | POA: Diagnosis present

## 2014-11-03 DIAGNOSIS — Z7982 Long term (current) use of aspirin: Secondary | ICD-10-CM | POA: Diagnosis not present

## 2014-11-03 DIAGNOSIS — A419 Sepsis, unspecified organism: Secondary | ICD-10-CM | POA: Diagnosis present

## 2014-11-03 DIAGNOSIS — E876 Hypokalemia: Secondary | ICD-10-CM | POA: Diagnosis present

## 2014-11-03 DIAGNOSIS — Z79899 Other long term (current) drug therapy: Secondary | ICD-10-CM | POA: Diagnosis not present

## 2014-11-03 DIAGNOSIS — L03116 Cellulitis of left lower limb: Secondary | ICD-10-CM

## 2014-11-03 DIAGNOSIS — R6521 Severe sepsis with septic shock: Secondary | ICD-10-CM | POA: Diagnosis present

## 2014-11-03 DIAGNOSIS — Z6841 Body Mass Index (BMI) 40.0 and over, adult: Secondary | ICD-10-CM

## 2014-11-03 DIAGNOSIS — J9811 Atelectasis: Secondary | ICD-10-CM | POA: Diagnosis present

## 2014-11-03 DIAGNOSIS — L299 Pruritus, unspecified: Secondary | ICD-10-CM | POA: Diagnosis present

## 2014-11-03 DIAGNOSIS — E785 Hyperlipidemia, unspecified: Secondary | ICD-10-CM | POA: Diagnosis present

## 2014-11-03 DIAGNOSIS — Z880 Allergy status to penicillin: Secondary | ICD-10-CM

## 2014-11-03 DIAGNOSIS — Z823 Family history of stroke: Secondary | ICD-10-CM | POA: Diagnosis not present

## 2014-11-03 DIAGNOSIS — L0390 Cellulitis, unspecified: Secondary | ICD-10-CM

## 2014-11-03 DIAGNOSIS — R509 Fever, unspecified: Secondary | ICD-10-CM | POA: Diagnosis present

## 2014-11-03 DIAGNOSIS — IMO0001 Reserved for inherently not codable concepts without codable children: Secondary | ICD-10-CM | POA: Insufficient documentation

## 2014-11-03 DIAGNOSIS — I10 Essential (primary) hypertension: Secondary | ICD-10-CM

## 2014-11-03 LAB — CBC WITH DIFFERENTIAL/PLATELET
BASOS ABS: 0 10*3/uL (ref 0.0–0.1)
Basophils Relative: 0 % (ref 0–1)
EOS ABS: 0 10*3/uL (ref 0.0–0.7)
EOS PCT: 0 % (ref 0–5)
HEMATOCRIT: 45.8 % (ref 39.0–52.0)
Hemoglobin: 15.3 g/dL (ref 13.0–17.0)
LYMPHS ABS: 1.2 10*3/uL (ref 0.7–4.0)
Lymphocytes Relative: 7 % — ABNORMAL LOW (ref 12–46)
MCH: 31.4 pg (ref 26.0–34.0)
MCHC: 33.4 g/dL (ref 30.0–36.0)
MCV: 94 fL (ref 78.0–100.0)
MONO ABS: 0.5 10*3/uL (ref 0.1–1.0)
MONOS PCT: 3 % (ref 3–12)
NEUTROS ABS: 14.9 10*3/uL — AB (ref 1.7–7.7)
Neutrophils Relative %: 90 % — ABNORMAL HIGH (ref 43–77)
Platelets: 323 10*3/uL (ref 150–400)
RBC: 4.87 MIL/uL (ref 4.22–5.81)
RDW: 14.3 % (ref 11.5–15.5)
WBC: 16.6 10*3/uL — AB (ref 4.0–10.5)

## 2014-11-03 LAB — LACTIC ACID, PLASMA
LACTIC ACID, VENOUS: 3.5 mmol/L — AB (ref 0.5–2.0)
Lactic Acid, Venous: 3.2 mmol/L (ref 0.5–2.0)

## 2014-11-03 LAB — COMPREHENSIVE METABOLIC PANEL
ALT: 40 U/L (ref 0–53)
AST: 30 U/L (ref 0–37)
Albumin: 3.1 g/dL — ABNORMAL LOW (ref 3.5–5.2)
Alkaline Phosphatase: 36 U/L — ABNORMAL LOW (ref 39–117)
Anion gap: 7 (ref 5–15)
BUN: 21 mg/dL (ref 6–23)
CALCIUM: 7.8 mg/dL — AB (ref 8.4–10.5)
CHLORIDE: 110 mmol/L (ref 96–112)
CO2: 25 mmol/L (ref 19–32)
Creatinine, Ser: 1.12 mg/dL (ref 0.50–1.35)
GFR calc Af Amer: >90 mL/min (ref >90)
GFR calc non Af Amer: 82 mL/min — ABNORMAL LOW (ref >90)
GLUCOSE: 88 mg/dL (ref 70–99)
Potassium: 3.4 mmol/L — ABNORMAL LOW (ref 3.5–5.1)
SODIUM: 142 mmol/L (ref 135–145)
TOTAL PROTEIN: 5.3 g/dL — AB (ref 6.0–8.3)
Total Bilirubin: 1.1 mg/dL (ref 0.3–1.2)

## 2014-11-03 LAB — URINALYSIS, ROUTINE W REFLEX MICROSCOPIC
Bilirubin Urine: NEGATIVE
GLUCOSE, UA: NEGATIVE mg/dL
HGB URINE DIPSTICK: NEGATIVE
Ketones, ur: NEGATIVE mg/dL
Leukocytes, UA: NEGATIVE
Nitrite: NEGATIVE
Protein, ur: NEGATIVE mg/dL
Specific Gravity, Urine: 1.014 (ref 1.005–1.030)
UROBILINOGEN UA: 0.2 mg/dL (ref 0.0–1.0)
pH: 5 (ref 5.0–8.0)

## 2014-11-03 LAB — I-STAT CHEM 8, ED
BUN: 27 mg/dL — AB (ref 6–23)
CALCIUM ION: 1.13 mmol/L (ref 1.12–1.23)
Chloride: 101 mmol/L (ref 96–112)
Creatinine, Ser: 1 mg/dL (ref 0.50–1.35)
Glucose, Bld: 98 mg/dL (ref 70–99)
HCT: 49 % (ref 39.0–52.0)
Hemoglobin: 16.7 g/dL (ref 13.0–17.0)
Potassium: 4.2 mmol/L (ref 3.5–5.1)
Sodium: 141 mmol/L (ref 135–145)
TCO2: 27 mmol/L (ref 0–100)

## 2014-11-03 LAB — SEDIMENTATION RATE: SED RATE: 8 mm/h (ref 0–16)

## 2014-11-03 LAB — PROTIME-INR
INR: 1.06 (ref 0.00–1.49)
Prothrombin Time: 14 seconds (ref 11.6–15.2)

## 2014-11-03 LAB — I-STAT CG4 LACTIC ACID, ED
LACTIC ACID, VENOUS: 4.55 mmol/L — AB (ref 0.5–2.0)
LACTIC ACID, VENOUS: 2.08 mmol/L — AB (ref 0.5–2.0)

## 2014-11-03 LAB — C-REACTIVE PROTEIN: CRP: 3.9 mg/dL — ABNORMAL HIGH (ref <0.60)

## 2014-11-03 LAB — CLOSTRIDIUM DIFFICILE BY PCR: Toxigenic C. Difficile by PCR: NEGATIVE

## 2014-11-03 LAB — RAPID URINE DRUG SCREEN, HOSP PERFORMED
Amphetamines: NOT DETECTED
BARBITURATES: NOT DETECTED
Benzodiazepines: NOT DETECTED
Cocaine: NOT DETECTED
Opiates: NOT DETECTED
TETRAHYDROCANNABINOL: NOT DETECTED

## 2014-11-03 LAB — MRSA PCR SCREENING: MRSA by PCR: NEGATIVE

## 2014-11-03 MED ORDER — SODIUM CHLORIDE 0.9 % IV BOLUS (SEPSIS)
1000.0000 mL | Freq: Once | INTRAVENOUS | Status: AC
  Administered 2014-11-03: 1000 mL via INTRAVENOUS

## 2014-11-03 MED ORDER — SODIUM CHLORIDE 0.9 % IV SOLN
INTRAVENOUS | Status: DC
  Administered 2014-11-03: 06:00:00 via INTRAVENOUS

## 2014-11-03 MED ORDER — SODIUM CHLORIDE 0.9 % IV BOLUS (SEPSIS)
2000.0000 mL | Freq: Once | INTRAVENOUS | Status: AC
  Administered 2014-11-03: 2000 mL via INTRAVENOUS

## 2014-11-03 MED ORDER — SODIUM CHLORIDE 0.9 % IJ SOLN
3.0000 mL | Freq: Two times a day (BID) | INTRAMUSCULAR | Status: DC
  Administered 2014-11-03 – 2014-11-05 (×4): 3 mL via INTRAVENOUS

## 2014-11-03 MED ORDER — ACETAMINOPHEN 500 MG PO TABS
1000.0000 mg | ORAL_TABLET | Freq: Once | ORAL | Status: AC
  Administered 2014-11-03: 1000 mg via ORAL
  Filled 2014-11-03: qty 2

## 2014-11-03 MED ORDER — SODIUM CHLORIDE 0.9 % IV SOLN: INTRAVENOUS | Status: DC | PRN

## 2014-11-03 MED ORDER — ENOXAPARIN SODIUM 100 MG/ML ~~LOC~~ SOLN
90.0000 mg | Freq: Every day | SUBCUTANEOUS | Status: DC
  Administered 2014-11-03 – 2014-11-06 (×4): 90 mg via SUBCUTANEOUS
  Filled 2014-11-03 (×4): qty 1

## 2014-11-03 MED ORDER — ALPRAZOLAM 0.25 MG PO TABS: 0.2500 mg | ORAL_TABLET | Freq: Two times a day (BID) | ORAL | Status: DC | PRN

## 2014-11-03 MED ORDER — ENOXAPARIN SODIUM 40 MG/0.4ML ~~LOC~~ SOLN: 40.0000 mg | SUBCUTANEOUS | Status: DC

## 2014-11-03 MED ORDER — LEVOFLOXACIN IN D5W 750 MG/150ML IV SOLN
750.0000 mg | Freq: Every day | INTRAVENOUS | Status: DC
  Administered 2014-11-03 – 2014-11-04 (×2): 750 mg via INTRAVENOUS
  Filled 2014-11-03 (×2): qty 150

## 2014-11-03 MED ORDER — ACETAMINOPHEN 325 MG PO TABS
650.0000 mg | ORAL_TABLET | Freq: Four times a day (QID) | ORAL | Status: DC | PRN
  Administered 2014-11-03 – 2014-11-04 (×4): 650 mg via ORAL
  Filled 2014-11-03 (×4): qty 2

## 2014-11-03 MED ORDER — PHENYLEPHRINE HCL 10 MG/ML IJ SOLN
30.0000 ug/min | INTRAVENOUS | Status: DC
  Administered 2014-11-03: 40 ug/min via INTRAVENOUS
  Filled 2014-11-03 (×2): qty 1

## 2014-11-03 MED ORDER — VANCOMYCIN HCL 10 G IV SOLR
1500.0000 mg | Freq: Two times a day (BID) | INTRAVENOUS | Status: AC
  Administered 2014-11-03 – 2014-11-05 (×5): 1500 mg via INTRAVENOUS
  Filled 2014-11-03 (×6): qty 1500

## 2014-11-03 MED ORDER — VANCOMYCIN HCL 10 G IV SOLR
2000.0000 mg | Freq: Once | INTRAVENOUS | Status: AC
  Administered 2014-11-03: 2000 mg via INTRAVENOUS
  Filled 2014-11-03: qty 2000

## 2014-11-03 MED ORDER — ONDANSETRON HCL 4 MG PO TABS: 4.0000 mg | ORAL_TABLET | Freq: Four times a day (QID) | ORAL | Status: DC | PRN

## 2014-11-03 MED ORDER — ONDANSETRON HCL 4 MG/2ML IJ SOLN
4.0000 mg | Freq: Four times a day (QID) | INTRAMUSCULAR | Status: DC | PRN
  Administered 2014-11-05: 4 mg via INTRAVENOUS
  Filled 2014-11-03: qty 2

## 2014-11-03 MED ORDER — ACETAMINOPHEN 650 MG RE SUPP: 650.0000 mg | Freq: Four times a day (QID) | RECTAL | Status: DC | PRN

## 2014-11-03 MED ORDER — ALBUMIN HUMAN 5 % IV SOLN
12.5000 g | Freq: Once | INTRAVENOUS | Status: AC
  Administered 2014-11-03: 12.5 g via INTRAVENOUS
  Filled 2014-11-03: qty 250

## 2014-11-03 NOTE — Progress Notes (Signed)
CRITICAL VALUE ALERT  Critical value received:  Lactic acid 3.5  Date of notification:  11/03/2014  Time of notification:  11:00  Critical value read back: yes  Nurse who received alert:  Lorrin JacksonAmy Deklyn Gibbon RN  MD notified (1st page):  Dr. Kendrick FriesMcQuaid  Time of first page: on unit, notified of critical lab  MD notified (2nd page): n/a  Time of second page:n/a  Responding MD: Dr. Kendrick FriesMcQuaid  Time MD responded:  11:03

## 2014-11-03 NOTE — Progress Notes (Signed)
Rx Brief Lovenox note  Wt=185 kg, BMI=64, CrCl~161 ml/min ordered Lovenox 40mg  daily for DVT prophylaxis Rx adjusted Lovenox to 90 (~0.5mg /kg) mg daily in pt with BMI>30  Thanks Lorenza EvangelistGreen, Shandel Busic R 11/03/2014 5:47 AM

## 2014-11-03 NOTE — H&P (Addendum)
[facility] History and Physical  Patient: Corey Wilcox  MRN: [id number]  DOB: 06/29/1976  DOS: the patient was seen and examined on 11/03/2014 PCP: Redge Gainer, MD  Chief Complaint: Generalized fatigue and chills  HPI: Corey Wilcox is a 39 y.o. male with Past medical history of hypertension, obesity, fatty liver. The patient presents with complains of generalized fatigue and chills. Symptoms started on Saturday night when he drank cold bottled water. He continued to remain cold and having chills throughout the night as well as during the day. He was also feeling generalized fatigue. Neck day he started noticing some tenderness on his legs and tightness on his legs as well as redness and since his symptoms were not improving he decided to come to the hospital. He denies any headache, blurring of the vision, nausea, vomiting, runny nose, cough, sore throat, neck stiffness, chest pain, abdominal pain, diarrhea, constipation, burning urination. He denies any exposure or travel outside of states. He denies any changes in his medication. He denies taking any herbal supplements. He denies any alcohol abuse drug abuse or any smoking habits.  The patient is coming from [facility] at his baseline independent for most of his ADL.  Review of Systems: as mentioned in the history of present illness.  A Comprehensive review of the other systems is negative.  Past Medical History  Diagnosis Date  . Palpitations   . Hyperplasia of prostate   . Other and unspecified hyperlipidemia   . Obesity   . Fatty liver   . Essential hypertension, benign    Past Surgical History  Procedure Laterality Date  . Tympanoplasty     Social History:  reports that he has never smoked. He has never used smokeless tobacco. He reports that he drinks alcohol. He reports that he does not use illicit drugs.  Allergies  Allergen Reactions  . Cephalexin Nausea And Vomiting  . Penicillins Nausea And Vomiting  and Rash    Family History  Problem Relation Age of Onset  . Stroke Father   . Atrial fibrillation Father     Prior to Admission medications   Medication Sig Start Date End Date Taking? Authorizing Provider  aspirin 325 MG tablet Take 325 mg by mouth once.   Yes Historical Provider, MD  cetirizine (ZYRTEC) 10 MG tablet Take 10 mg by mouth daily.     Yes Historical Provider, MD  cholecalciferol (VITAMIN D) 1000 UNITS tablet Take 1,000 Units by mouth daily.   Yes Historical Provider, MD  olmesartan-hydrochlorothiazide (BENICAR HCT) 40-25 MG per tablet TAKE 1 TABLET BY MOUTH DAILY. Patient taking differently: Take 0.5 tablets by mouth daily. TAKE 1 TABLET BY MOUTH DAILY. 05/29/14  Yes Lysbeth Penner, FNP  simvastatin (ZOCOR) 10 MG tablet TAKE 1 TABLET (10 MG TOTAL) BY MOUTH AT BEDTIME. 09/04/14  Yes Chipper Herb, MD    Physical Exam: Filed Vitals:   11/03/14 0540 11/03/14 0550 11/03/14 0555 11/03/14 0600  BP: _0 77/34  Pulse:  138  133  Temp: 98.7 F (37.1 C)     TempSrc: Oral     Resp:  16  21  Height: _1  (1.702 m)     Weight: 185.8 kg (409 lb 9.8 oz)     SpO2:  96%  96%    General: Alert, Awake and Oriented to Time, Place and Person. Appear in mild distress Eyes: PERRL ENT: Oral Mucosa clear moist. Neck: Difficult to assess JVD Cardiovascular: S1 and S2 Present, no Murmur, Peripheral  Pulses Present Respiratory: Bilateral Air entry equal and Decreased, Clear to Auscultation, noCrackles, no wheezes Abdomen: Bowel Sound present, Soft and non tender Skin: Warm and redness of the left leg Extremities: Bilateral Pedal edema, left calf tenderness Neurologic: Grossly no focal neuro deficit.  Labs on Admission:  CBC:  Recent Labs Lab 11/03/14 0245 11/03/14 0415  WBC 16.6*  --   NEUTROABS 14.9*  --   HGB 15.3 16.7  HCT 45.8 49.0  MCV 94.0  --   PLT 323  --     CMP     Component Value Date/Time   NA 141 11/03/2014 0415   NA 141 01/04/2014 0815    K 4.2 11/03/2014 0415   CL 101 11/03/2014 0415   CO2 29 07/29/2014 0530   GLUCOSE 98 11/03/2014 0415   GLUCOSE 86 01/04/2014 0815   BUN 27* 11/03/2014 0415   BUN 16 01/04/2014 0815   CREATININE 1.00 11/03/2014 0415   CALCIUM 8.7 07/29/2014 0530   PROT 5.5* 07/25/2014 0840   PROT 6.0 01/04/2014 0815   ALBUMIN 2.7* 07/25/2014 0840   AST 22 07/25/2014 0840   ALT 40 07/25/2014 0840   ALKPHOS 41 07/25/2014 0840   BILITOT 0.7 07/25/2014 0840   GFRNONAA >90 07/29/2014 0530   GFRAA >90 07/29/2014 0530    No results for input(s): LIPASE, AMYLASE in the last 168 hours.  No results for input(s): CKTOTAL, CKMB, CKMBINDEX, TROPONINI in the last 168 hours. BNP (last 3 results) No results for input(s): BNP in the last 8760 hours.  ProBNP (last 3 results) No results for input(s): PROBNP in the last 8760 hours.   Radiological Exams on Admission: Dg Chest Port 1 View  11/03/2014   CLINICAL DATA:  Shortness of breath, tachycardia, fever.  EXAM: PORTABLE CHEST - 1 VIEW  COMPARISON:  07/25/2014  FINDINGS: Heart is at the upper limits of normal. Pulmonary vasculature is normal for technique. No consolidation, pleural effusion or pneumothorax. No acute osseous abnormalities are seen.  IMPRESSION: No acute pulmonary process.   Electronically Signed   By: Jeb Levering M.D.   On: 11/03/2014 05:25    EKG: Independently reviewed. sinus tachycardia.  Assessment/Plan Principal Problem:   Sepsis due to cellulitis Active Problems:   Obesity   Essential hypertension, benign   1. Sepsis due to cellulitis The patient is presenting with numbness of generalized fatigue and chills. He is found to be significantly tachycardic with heart rate in 160s. With that his lactic acid is also significantly elevated. Patient has so far received 4 L of normal saline bolus. Heart rate continues to remain elevated in 120s. The patient will be admitted in stepdown unit. We'll continue aggressive IV  hydration. Patient does not have any other complaints and so far the workup is only positive for elevated lactic acid levels. Urine analysis and chest x-ray is clear. With this the patient will be treated aggressively with vancomycin and Levaquin. I would continue monitoring his blood culture. Obtain sputum culture. Influenza PCR. Check ESR and CRP. Check UDS.  2. Hypertension. Holding his oral hypertensive medications.  3. Obesity. May require bariatric bed.  Advance goals of care discussion: Full code  DVT Prophylaxis: subcutaneous Heparin Nutrition: Regular  Family Communication: Family was present at bedside, opportunity was given to ask question and all questions were answered satisfactorily at the time of interview. Disposition: Admitted to inpatient in step-down unit.  Author: Berle Mull, MD [facility] Pager: [phone] 11/03/2014, 6:13 AM    If 7PM-7AM, please  contact night-coverage [email or web address] Password [email or web address]

## 2014-11-03 NOTE — ED Provider Notes (Signed)
CSN: [id number]     Arrival date & time 11/03/14  0231 History   First MD Initiated Contact with Patient 11/03/14 0247     Chief Complaint  Patient presents with  . Tachycardia     (Consider location/radiation/quality/duration/timing/severity/associated sxs/prior Treatment) HPI   Corey Wilcox is a 39 y.o. male with PMH of cellulitis presenting today with feeling ill.  Patient states this began tonight with sudden onset.  He has had fevers and chills today as well.  Last time this happened he was admitted tot he hospital for sepsis.  He denies any coughing, pain in his body, or changes in his urine or bowel movements.   Patient states he has no idea what is going on, but knows that he does not feel good.  He has no further complaints.  10 Systems reviewed and are negative for acute change except as noted in the HPI.    Past Medical History  Diagnosis Date  . Palpitations   . Hyperplasia of prostate   . Other and unspecified hyperlipidemia   . Obesity   . Fatty liver   . Essential hypertension, benign    Past Surgical History  Procedure Laterality Date  . Tympanoplasty     Family History  Problem Relation Age of Onset  . Stroke Father   . Atrial fibrillation Father    History  Substance Use Topics  . Smoking status: Never Smoker   . Smokeless tobacco: Never Used  . Alcohol Use: Yes     Comment: occasionally    Review of Systems    Allergies  Cephalexin and Penicillins  Home Medications   Prior to Admission medications   Medication Sig Start Date End Date Taking? Authorizing Provider  aspirin 325 MG tablet Take 325 mg by mouth once.    Historical Provider, MD  cetirizine (ZYRTEC) 10 MG tablet Take 10 mg by mouth daily.      Historical Provider, MD  cholecalciferol (VITAMIN D) 1000 UNITS tablet Take 1,000 Units by mouth daily.    Historical Provider, MD  olmesartan-hydrochlorothiazide (BENICAR HCT) 40-25 MG per tablet TAKE 1 TABLET BY MOUTH DAILY. Patient  taking differently: 0.5 tablets. TAKE 1 TABLET BY MOUTH DAILY. 05/29/14   Deatra Canter, FNP  simvastatin (ZOCOR) 10 MG tablet TAKE 1 TABLET (10 MG TOTAL) BY MOUTH AT BEDTIME. 09/04/14   Ernestina Penna, MD   BP 137/61 mmHg  Pulse 150  Temp(Src) 102.2 F (39 C) (Oral)  Resp 16  SpO2 95% Physical Exam  Constitutional: He is oriented to person, place, and time. Vital signs are normal. He appears well-developed and well-nourished.  Non-toxic appearance. He does not appear ill. No distress.  Obese male  HENT:  Head: Normocephalic and atraumatic.  Nose: Nose normal.  Mouth/Throat: Oropharynx is clear and moist. No oropharyngeal exudate.  Eyes: Conjunctivae and EOM are normal. Pupils are equal, round, and reactive to light. No scleral icterus.  Neck: Normal range of motion. Neck supple. No tracheal deviation, no edema, no erythema and normal range of motion present. No thyroid mass and no thyromegaly present.  Cardiovascular: Regular rhythm, S1 normal, S2 normal, normal heart sounds, intact distal pulses and normal pulses.  Exam reveals no gallop and no friction rub.   No murmur heard. Pulses:      Radial pulses are 2+ on the right side, and 2+ on the left side.       Dorsalis pedis pulses are 2+ on the right side, and 2+ on the  left side.  tachycardia  Pulmonary/Chest: Effort normal and breath sounds normal. No respiratory distress. He has no wheezes. He has no rhonchi. He has no rales.  Abdominal: Soft. Normal appearance and bowel sounds are normal. He exhibits no distension, no ascites and no mass. There is no hepatosplenomegaly. There is no tenderness. There is no rebound, no guarding and no CVA tenderness.  Musculoskeletal: Normal range of motion. He exhibits no edema or tenderness.  LLE with erythema and warmth of 5cm in length between the calf and ankle. No TTP  Lymphadenopathy:    He has no cervical adenopathy.  Neurological: He is alert and oriented to person, place, and time. He  has normal strength. No cranial nerve deficit or sensory deficit.  Skin: Skin is warm, dry and intact. No petechiae and no rash noted. He is not diaphoretic. No erythema. No pallor.  Psychiatric: He has a normal mood and affect. His behavior is normal. Judgment normal.  Nursing note and vitals reviewed.   ED Course  Procedures (including critical care time) Labs Review Labs Reviewed  CBC WITH DIFFERENTIAL/PLATELET - Abnormal; Notable for the following:    WBC 16.6 (*)    Neutrophils Relative % 90 (*)    Neutro Abs 14.9 (*)    Lymphocytes Relative 7 (*)    All other components within normal limits  I-STAT CG4 LACTIC ACID, ED - Abnormal; Notable for the following:    Lactic Acid, Venous 4.55 (*)    All other components within normal limits  I-STAT CHEM 8, ED - Abnormal; Notable for the following:    BUN 27 (*)    All other components within normal limits  CULTURE, BLOOD (ROUTINE X 2)  CULTURE, BLOOD (ROUTINE X 2)  URINALYSIS, ROUTINE W REFLEX MICROSCOPIC  URINE RAPID DRUG SCREEN (HOSP PERFORMED)  I-STAT CG4 LACTIC ACID, ED    Imaging Review No results found.   EKG Interpretation   Date/Time:  Sunday November 03 2014 02:40:50 EST Ventricular Rate:  166 PR Interval:  106 QRS Duration: 75 QT Interval:  251 QTC Calculation: 417 R Axis:   10 Text Interpretation:  Sinus tachycardia Low voltage, precordial leads  Confirmed by Erroll Luna [phone]) on 11/03/2014 2:50:23 AM      MDM   Final diagnoses:  Fever    Patient presents to the ED with fevers and feeling ill.  HR is 170, temp 102.2.  Patient was given tylenol 1gram, 2L IVF, bloodcultures drawn, Vancomycin given.  Patient will be admitted for cellulitis.  Patient continues to be tachycardic, 2 more liters of IV fluids were given. Heart rate has come down to 135 while I was in the room.  Chest x-ray and urinalysis are currently pending although patient is not having any symptoms, I doubt this is the source of  his infection. There is redness to his left lower extremity with warmth, likely recurrent cellulitis. Dr. Allena Katz has agreed to admit the [facility] to [facility], stepdown unit.  CRITICAL CARE Performed by: Tomasita Crumble   Total critical care time: - sepsis  Critical care time was exclusive of separately billable procedures and treating other patients.  Critical care was necessary to treat or prevent imminent or life-threatening deterioration.  Critical care was time spent personally by me on the following activities: development of treatment plan with patient and/or surrogate as well as nursing, discussions with consultants, evaluation of patient's response to treatment, examination of patient, obtaining history from patient or surrogate, ordering and performing treatments and interventions,  ordering and review of laboratory studies, ordering and review of radiographic studies, pulse oximetry and re-evaluation of patient's condition.    Tomasita CrumbleAdeleke Zebulin Siegel, MD 11/03/14 [phone]

## 2014-11-03 NOTE — Progress Notes (Signed)
Utilization review completed.  

## 2014-11-03 NOTE — Progress Notes (Signed)
Patient seen and examined this morning, agree with H&P. Patient admitted to SDU with sepsis due to LLE cellulitis, similar presentation last November. He received ~4-5 L of fluids and was persistently hypotensive. A line was attempted without success, Dr. Allena KatzPatel discussed with PCCM to come in consult. - patient relatively asymptomatic with that blood pressure, ? Accuracy - continue Vancomycin and Levofloxacin - check A1C to evaluate for DM - he is morbidly obese - keep in SDU today  Laural Eiland M. Elvera LennoxGherghe, MD [facility] [phone]

## 2014-11-03 NOTE — ED Notes (Signed)
Pt arrived to the ED with a complaint of shortness of breath and tachycardia.  Pt states he woke up feeling bad and having difficulty breathing.  Pt states he has a history of sepsis and cellulitis.

## 2014-11-03 NOTE — Consult Note (Signed)
PULMONARY / CRITICAL CARE MEDICINE   Name: Corey Wilcox MRN: [id number] DOB: August 13, 1976    ADMISSION DATE:  11/03/2014 CONSULTATION DATE:  11/03/14  REFERRING MD :  Risa Grill  CHIEF COMPLAINT:  Shock  INITIAL PRESENTATION: Admitted on 2/28 to [facility] with septic shock from cellulitis.  STUDIES:    SIGNIFICANT EVENTS:    HISTORY OF PRESENT ILLNESS:  This is a pleasant 39 yo male with morbid obesity, fatty liver, and hypertension who was admitted on 2/28 to [facility] with recurrent septic shock from cellulitis.  He was admitted in November 2015 with a nearly identical presentation.  He stated that this time he noted the sudden onset of chills, fever and weakness on 2/28.  He then noted the onset of redness and warmth (but no pain) of the left lower extremity very similar to his presentation in November.  In November he thinks that he may have scraped his leg cutting the grass prior to the injury, but this time he said there was no such injury.  He denies recent work outside, but he notes he has been on his feet a lot recently at work because of a busy schedule.  He works in an [employer].  He denies pain of any kind, headache, sinus symptoms, nausea, vomiting, or diarrhea.  PAST MEDICAL HISTORY :   has a past medical history of Palpitations; Hyperplasia of prostate; Other and unspecified hyperlipidemia; Obesity; Fatty liver; and Essential hypertension, benign.  has past surgical history that includes tympanoplasty. Prior to Admission medications   Medication Sig Start Date End Date Taking? Authorizing Provider  aspirin 325 MG tablet Take 325 mg by mouth once.   Yes Historical Provider, MD  cetirizine (ZYRTEC) 10 MG tablet Take 10 mg by mouth daily.     Yes Historical Provider, MD  cholecalciferol (VITAMIN D) 1000 UNITS tablet Take 1,000 Units by mouth daily.   Yes Historical Provider, MD  olmesartan-hydrochlorothiazide (BENICAR HCT) 40-25 MG per tablet TAKE 1 TABLET BY MOUTH  DAILY. Patient taking differently: Take 0.5 tablets by mouth daily. TAKE 1 TABLET BY MOUTH DAILY. 05/29/14  Yes Deatra Canter, FNP  simvastatin (ZOCOR) 10 MG tablet TAKE 1 TABLET (10 MG TOTAL) BY MOUTH AT BEDTIME. 09/04/14  Yes Ernestina Penna, MD   Allergies  Allergen Reactions  . Cephalexin Nausea And Vomiting  . Penicillins Nausea And Vomiting and Rash    FAMILY HISTORY:  indicated that his mother is alive. He indicated that his father is deceased. He indicated that his sister is alive.  SOCIAL HISTORY:  reports that he has never smoked. He has never used smokeless tobacco. He reports that he drinks alcohol. He reports that he does not use illicit drugs.  REVIEW OF SYSTEMS:   Gen: + fever, + chills, weight change, + fatigue, night sweats HEENT: Denies blurred vision, double vision, hearing loss, tinnitus, sinus congestion, rhinorrhea, sore throat, neck stiffness, dysphagia PULM: Denies shortness of breath, cough, sputum production, hemoptysis, wheezing CV: Denies chest pain, edema, orthopnea, paroxysmal nocturnal dyspnea, palpitations GI: Denies abdominal pain, nausea, vomiting, diarrhea, hematochezia, melena, constipation, change in bowel habits GU: Denies dysuria, hematuria, polyuria, oliguria, urethral discharge Endocrine: Denies hot or cold intolerance, polyuria, polyphagia or appetite change Derm: per HPI Heme: Denies easy bruising, bleeding, bleeding gums Neuro: Denies headache, numbness, weakness, slurred speech, loss of memory or consciousness   SUBJECTIVE:   VITAL SIGNS: Temp:  [98.7 F (37.1 C)-102.2 F (39 C)] 98.7 F (37.1 C) (02/28 0540) Pulse Rate:  [  126-150] 126 (02/28 0700) Resp:  [15-26] 21 (02/28 0700) BP: (49-140)/(15-66) Corey Wilcox/34 mmHg (02/28 0700) SpO2:  [94 %-98 %] 94 % (02/28 0700) Weight:  [185.8 kg (409 lb 9.8 oz)] 185.8 kg (409 lb 9.8 oz) (02/28 0540) HEMODYNAMICS:   VENTILATOR SETTINGS:   INTAKE / OUTPUT:  Intake/Output Summary (Last 24 hours)  at 11/03/14 0819 Last data filed at 11/03/14 0700  Gross per 24 hour  Intake   1375 ml  Output      0 ml  Net   1375 ml    PHYSICAL EXAMINATION: Gen: comfortable in bed HEENT: NCAT,  EOMi, OP clear, neck supple without masses PULM: CTA B (difficult exam due to obesity) CV: RRR, no clear murmur but difficult exam AB: BS+, soft, nontender, no hsm Ext: warm, trace edema, no clubbing, no cyanosis, see derm Derm: left lower extremity with non-raised diffuse warmth and redness circumferential to the distal leg below the calf and above the ankle Neuro: A&Ox4, CN II-XII intact, MAEW   LABS:  CBC  Recent Labs Lab 11/03/14 0245 11/03/14 0415  WBC 16.6*  --   HGB 15.3 16.7  HCT 45.8 49.0  PLT 323  --    Coag's  Recent Labs Lab 11/03/14 0715  INR 1.06   BMET  Recent Labs Lab 11/03/14 0415 11/03/14 0715  NA 141 142  K 4.2 3.4*  CL 101 110  CO2  --  25  BUN 27* 21  CREATININE 1.00 1.12  GLUCOSE 98 88   Electrolytes  Recent Labs Lab 11/03/14 0715  CALCIUM 7.8*   Sepsis Markers  Recent Labs Lab 11/03/14 0415 11/03/14 0512  LATICACIDVEN 4.55* 2.08*   ABG No results for input(s): PHART, PCO2ART, PO2ART in the last 168 hours. Liver Enzymes  Recent Labs Lab 11/03/14 0715  AST 30  ALT 40  ALKPHOS 36*  BILITOT 1.1  ALBUMIN 3.1*   Cardiac Enzymes No results for input(s): TROPONINI, PROBNP in the last 168 hours. Glucose No results for input(s): GLUCAP in the last 168 hours.  Imaging No results found.   ASSESSMENT / PLAN:  PULMONARY A: Acute respiratory failure with hypoxemia > mild hypoxemia likely due to atelectasis, at risk for pulmonary edema due to volume resuscitation P:   -O2 as needed for O2 saturation > 92% -CXR port now (will be difficult to interpret, but exam limited as well due to obesity)  CARDIOVASCULAR A: Septic shock> has received more than 30cc/kg (5.5L) and still with low BP, however lactic acid is improving and he appears  non-toxic; could not place a-line P:  -at this point will start neo peripherally for MAP >65 -repeat lactic acid -tele -if he needs a second vasopressor will place CVL -decrease NS at 75cc/hr for now due to risk for pulm edema  RENAL A:  Hypokalemia P:   -Monitor BMET and UOP -Replace electrolytes as needed   GASTROINTESTINAL A:  No acute issues P:   -regular diet  HEMATOLOGIC A:  No acute issues P:  -monitor for bleeding  INFECTIOUS A:  Septic shock due to cellulitis> exam seems consistent with strep pyogenes, but agree with staph coverage given prevelence in community; No palpable crepitance or pain to suggest necrotizing infection P:   BCx2 2/28 >>  Vanc 2/28 >> Levaquin 2/28 >>  Monitor derm exam  Given second severe reaction, may want to involve ID > why does this keep recurring when there is no clear port of entry (skin breakdown, etc)?  ENDOCRINE A:  No acute issues P:   -check Hgb A1c to ensure no DM2 given obesity and unexplained recurrent infections  NEUROLOGIC A:  No acute issues P:     FAMILY  - Updates: none at bedside   TODAY'S SUMMARY:  39 y/o male with septic shock due to cellulitis again for second time in 4 months.  Improving but needs low dose neosynephrine for BP support.    My cc time 45 minutes  Heber CarolinaBrent Dona Klemann, MD [facility] Pager: [phone] Cell: [phone] If no response, call [phone]   11/03/2014, 8:19 AM

## 2014-11-03 NOTE — Progress Notes (Signed)
RT X's 2 attempted Aline without success.  MD notified, RT to monitor and assess as needed.

## 2014-11-03 NOTE — Progress Notes (Signed)
ANTIBIOTIC CONSULT NOTE - INITIAL  Pharmacy Consult for Vancomycin and Levaquin Indication: Sepsis secondary to Cellulitis  Allergies  Allergen Reactions  . Cephalexin Nausea And Vomiting  . Penicillins Nausea And Vomiting and Rash    Patient Measurements: Height: 5\' 7"  (170.2 cm) Weight: (!) 409 lb 9.8 oz (185.8 kg) IBW/kg (Calculated) : 66.1   Vital Signs: Temp: 98.7 F (37.1 C) (02/28 0540) Temp Source: Oral (02/28 0540) BP: 50/21 mmHg (02/28 0630) Pulse Rate: 130 (02/28 0630) Intake/Output from previous day:   Intake/Output from this shift:    Labs:  Recent Labs  11/03/14 0245 11/03/14 0415  WBC 16.6*  --   HGB 15.3 16.7  PLT 323  --   CREATININE  --  1.00   Estimated Creatinine Clearance: 161.5 mL/min (by C-G formula based on Cr of 1). No results for input(s): VANCOTROUGH, VANCOPEAK, VANCORANDOM, GENTTROUGH, GENTPEAK, GENTRANDOM, TOBRATROUGH, TOBRAPEAK, TOBRARND, AMIKACINPEAK, AMIKACINTROU, AMIKACIN in the last 72 hours.   Microbiology: No results found for this or any previous visit (from the past 720 hour(s)).  Medical History: Past Medical History  Diagnosis Date  . Palpitations   . Hyperplasia of prostate   . Other and unspecified hyperlipidemia   . Obesity   . Fatty liver   . Essential hypertension, benign     Assessment: [age] yoM c/o generalized fatigue and chills. Pt with history of Left leg cellulitis now with Sepsis secondary to cellulitis.   Goal of Therapy:  Vancomycin trough level 15-20 mcg/ml  Plan:   Levaquin 750mg  IV q24h  Vancomycin 2Gm x1 then 1500mg  IV q12h  F/u SCr/cultures/levels as needed  Lorenza EvangelistGreen, Laryssa Hassing R 11/03/2014,6:43 AM

## 2014-11-03 NOTE — Progress Notes (Signed)
VASCULAR LAB PRELIMINARY  PRELIMINARY  PRELIMINARY  PRELIMINARY  Bilateral lower extremity venous Dopplers completed.    Preliminary report:  There is no obvious evidence of DVT or SVT noted in the bilateral lower extremities.  There is an enlarged lymph node noted in the left groin.  Corey Wilcox, RVT 11/03/2014, 10:11 AM

## 2014-11-03 NOTE — Progress Notes (Signed)
CRITICAL VALUE ALERT  Critical value received:  Lactic acid 3.2  Date of notification:  11/03/2014  Time of notification:  1543  Critical value read back: yes  Nurse who received alert:  Lorrin JacksonAmy Islay Polanco RN  MD notified (1st page):  Dr. Sung AmabileSimonds  Time of first page:  1543  MD notified (2nd page): n/a  Time of second page: n/a  Responding MD:  Dr. Sung AmabileSimonds  Time MD responded:  [phone]

## 2014-11-04 DIAGNOSIS — R6521 Severe sepsis with septic shock: Secondary | ICD-10-CM

## 2014-11-04 DIAGNOSIS — IMO0001 Reserved for inherently not codable concepts without codable children: Secondary | ICD-10-CM | POA: Insufficient documentation

## 2014-11-04 DIAGNOSIS — R6520 Severe sepsis without septic shock: Secondary | ICD-10-CM

## 2014-11-04 DIAGNOSIS — L03116 Cellulitis of left lower limb: Secondary | ICD-10-CM

## 2014-11-04 DIAGNOSIS — Z880 Allergy status to penicillin: Secondary | ICD-10-CM

## 2014-11-04 DIAGNOSIS — D72829 Elevated white blood cell count, unspecified: Secondary | ICD-10-CM

## 2014-11-04 DIAGNOSIS — Z8619 Personal history of other infectious and parasitic diseases: Secondary | ICD-10-CM

## 2014-11-04 DIAGNOSIS — A419 Sepsis, unspecified organism: Secondary | ICD-10-CM | POA: Diagnosis present

## 2014-11-04 LAB — CBC
HCT: 38.8 % — ABNORMAL LOW (ref 39.0–52.0)
Hemoglobin: 13.2 g/dL (ref 13.0–17.0)
MCH: 31.9 pg (ref 26.0–34.0)
MCHC: 34 g/dL (ref 30.0–36.0)
MCV: 93.7 fL (ref 78.0–100.0)
PLATELETS: 212 10*3/uL (ref 150–400)
RBC: 4.14 MIL/uL — ABNORMAL LOW (ref 4.22–5.81)
RDW: 15 % (ref 11.5–15.5)
WBC: 24.3 10*3/uL — ABNORMAL HIGH (ref 4.0–10.5)

## 2014-11-04 LAB — COMPREHENSIVE METABOLIC PANEL
ALBUMIN: 2.8 g/dL — AB (ref 3.5–5.2)
ALK PHOS: 57 U/L (ref 39–117)
ALT: 40 U/L (ref 0–53)
ANION GAP: 7 (ref 5–15)
AST: 43 U/L — ABNORMAL HIGH (ref 0–37)
BILIRUBIN TOTAL: 1.6 mg/dL — AB (ref 0.3–1.2)
BUN: 13 mg/dL (ref 6–23)
CO2: 25 mmol/L (ref 19–32)
Calcium: 7.7 mg/dL — ABNORMAL LOW (ref 8.4–10.5)
Chloride: 107 mmol/L (ref 96–112)
Creatinine, Ser: 0.92 mg/dL (ref 0.50–1.35)
GFR calc Af Amer: >90 mL/min (ref >90)
GFR calc non Af Amer: >90 mL/min (ref >90)
GLUCOSE: 97 mg/dL (ref 70–99)
Potassium: 4.5 mmol/L (ref 3.5–5.1)
Sodium: 139 mmol/L (ref 135–145)
Total Protein: 5.3 g/dL — ABNORMAL LOW (ref 6.0–8.3)

## 2014-11-04 LAB — LACTIC ACID, PLASMA: LACTIC ACID, VENOUS: 1.1 mmol/L (ref 0.5–2.0)

## 2014-11-04 LAB — SEDIMENTATION RATE: SED RATE: 50 mm/h — AB (ref 0–16)

## 2014-11-04 LAB — HEMOGLOBIN A1C
Hgb A1c MFr Bld: 5.7 % — ABNORMAL HIGH (ref 4.8–5.6)
Mean Plasma Glucose: 117 mg/dL

## 2014-11-04 MED ORDER — HYDROCODONE-ACETAMINOPHEN 5-325 MG PO TABS
1.0000 | ORAL_TABLET | Freq: Four times a day (QID) | ORAL | Status: DC | PRN
  Administered 2014-11-04 – 2014-11-05 (×2): 2 via ORAL
  Filled 2014-11-04 (×2): qty 2

## 2014-11-04 MED ORDER — SODIUM CHLORIDE 0.9 % IV SOLN
INTRAVENOUS | Status: DC
  Administered 2014-11-04: 13:00:00 via INTRAVENOUS

## 2014-11-04 MED ORDER — LOPERAMIDE HCL 2 MG PO CAPS
2.0000 mg | ORAL_CAPSULE | Freq: Two times a day (BID) | ORAL | Status: DC | PRN
  Administered 2014-11-04: 2 mg via ORAL
  Filled 2014-11-04: qty 1

## 2014-11-04 MED ORDER — FUROSEMIDE 10 MG/ML IJ SOLN
20.0000 mg | Freq: Once | INTRAMUSCULAR | Status: AC
  Administered 2014-11-04: 20 mg via INTRAVENOUS
  Filled 2014-11-04: qty 2

## 2014-11-04 MED ORDER — SIMVASTATIN 10 MG PO TABS
10.0000 mg | ORAL_TABLET | Freq: Every day | ORAL | Status: DC
  Administered 2014-11-04 – 2014-11-05 (×2): 10 mg via ORAL
  Filled 2014-11-04 (×3): qty 1

## 2014-11-04 NOTE — Progress Notes (Signed)
PROGRESS NOTE  Corey Wilcox WUJ:[id number] DOB: 1976/01/10 DOA: 11/03/2014 PCP: Rudi Heap, MD  HPI: 39 year old gentleman with history of hypertension and obesity and fatty liver, presented with septic shock in the setting of left lower extremity cellulitis.  Subjective / 24 H Interval events He feels well today, came off of vasopressors yesterday and his blood pressure has remained stable Denies any chest pain, abdominal pain, nausea or vomiting Feels like his cellulitis has gotten a little bit worse since yesterday  Assessment/Plan: Principal Problem:   Sepsis due to cellulitis Active Problems:   Obesity   Essential hypertension, benign   Cellulitis of left lower extremity   Blood poisoning   Septic shock   Septic shock due to cellulitis - He required vasopressors on 2/28 - Discontinued since yesterday, stable - Transfer to floor today - Continue vancomycin and levofloxacin - Second episode of septic shock due to cellulitis in the same area without apparent source, consulted ID, appreciate input  Obesity - counseled  HTN - if persistently high will restart his home Benicar  HLD - on SImvastatin   Diet: Diet regular Fluids: none DVT Prophylaxis: Lovenox  Code Status: Full Code Family Communication: non bedside this morning  Disposition Plan: transfer to floor  Consultants:  PCCM  ID  Procedures:  None    Antibiotics Vancomycin 2/28 >> Levofloxacin 2/28 >>   Studies  Dg Chest Port 1 View  11/03/2014   CLINICAL DATA:  Acute respiratory failure  EXAM: PORTABLE CHEST - 1 VIEW  COMPARISON:  Radiograph 10/14/2014  FINDINGS: Normal cardiac silhouette. There is mild central venous pulmonary congestion. No effusion, infiltrate, or pneumothorax. No acute osseous abnormality.  IMPRESSION: Central venous congestion.   Electronically Signed   By: Genevive Bi M.D.   On: 11/03/2014 10:37   Dg Chest Port 1 View  11/03/2014   CLINICAL DATA:  Shortness  of breath, tachycardia, fever.  EXAM: PORTABLE CHEST - 1 VIEW  COMPARISON:  07/25/2014  FINDINGS: Heart is at the upper limits of normal. Pulmonary vasculature is normal for technique. No consolidation, pleural effusion or pneumothorax. No acute osseous abnormalities are seen.  IMPRESSION: No acute pulmonary process.   Electronically Signed   By: Rubye Oaks M.D.   On: 11/03/2014 05:25    Objective  Filed Vitals:   11/04/14 0000 11/04/14 0100 11/04/14 0206 11/04/14 0615  BP: 108/59 113/79 143/87 158/84  Pulse: 114  110 107  Temp: 100.5 F (38.1 C)     TempSrc: Axillary     Resp: 18  19 32  Height:      Weight:      SpO2: 96%  95% 99%    Intake/Output Summary (Last 24 hours) at 11/04/14 9562 Last data filed at 11/04/14 0118  Gross per 24 hour  Intake   4920 ml  Output   1061 ml  Net   3859 ml   Filed Weights   11/03/14 0540  Weight: 185.8 kg (409 lb 9.8 oz)    Exam:  General:  NAD, morbidly obese male  HEENT: no scleral icterus, PERRL  Cardiovascular: RRR without MRG  Respiratory: CTA biL  Abdomen: soft, obese, non tender  MSK/Extremities: left lower extremity with cellulitic appearance above ankle to below knee, marked  Skin: no other rashes  Neuro: non focal   Data Reviewed: Basic Metabolic Panel:  Recent Labs Lab 11/03/14 0415 11/03/14 0715  NA 141 142  K 4.2 3.4*  CL 101 110  CO2  --  25  GLUCOSE 98 88  BUN 27* 21  CREATININE 1.00 1.12  CALCIUM  --  7.8*   Liver Function Tests:  Recent Labs Lab 11/03/14 0715  AST 30  ALT 40  ALKPHOS 36*  BILITOT 1.1  PROT 5.3*  ALBUMIN 3.1*   CBC:  Recent Labs Lab 11/03/14 0245 11/03/14 0415  WBC 16.6*  --   NEUTROABS 14.9*  --   HGB 15.3 16.7  HCT 45.8 49.0  MCV 94.0  --   PLT 323  --     Recent Results (from the past 240 hour(s))  MRSA PCR Screening     Status: None   Collection Time: 11/03/14  5:47 AM  Result Value Ref Range Status   MRSA by PCR NEGATIVE NEGATIVE Final     Comment:        The GeneXpert MRSA Assay (FDA approved for NASAL specimens only), is one component of a comprehensive MRSA colonization surveillance program. It is not intended to diagnose MRSA infection nor to guide or monitor treatment for MRSA infections.   Clostridium Difficile by PCR     Status: None   Collection Time: 11/03/14  1:56 PM  Result Value Ref Range Status   C difficile by pcr NEGATIVE NEGATIVE Final    Comment: Performed [facility]     Scheduled Meds: . enoxaparin (LOVENOX) injection  90 mg Subcutaneous Daily  . levofloxacin (LEVAQUIN) IV  750 mg Intravenous QAC breakfast  . sodium chloride  3 mL Intravenous Q12H  . vancomycin  1,500 mg Intravenous Q12H   Continuous Infusions: . sodium chloride 75 mL/hr at 11/03/14 0858  . phenylephrine (NEO-SYNEPHRINE) Adult infusion Stopped (11/03/14 1600)    Pamella Pertostin Mitra Duling, MD [facility] Pager [phone]. If 7 PM - 7 AM, please contact night-coverage at [email or web address], password [email or web address] 11/04/2014, 6:42 AM  LOS: 1 day

## 2014-11-04 NOTE — Consult Note (Addendum)
[place] for [facility]  Total days of antibiotics 3        Day 3 vanco        Day 2 levofloxacin         Reason for Consult: cellulitis, severe sepsis    Referring Physician: gherghe  Principal Problem:   Sepsis due to cellulitis Active Problems:   Obesity   Essential hypertension, benign   Cellulitis of left lower extremity   Blood poisoning   Septic shock    HPI: Corey Wilcox is a 39 y.o. male with morbid obesity, hypertension, who previously was admitted for sepsis due to cellulitis in November 2015, now readmitted on 11/03/2014 for similar presentation of fever, rigors, chills, and lower extremity erythema. He denies trauma, or animal scratches. On admit, found to have 102.2, HR 798X, BP systolic of 21J, LA 3.5, WBC of 16.6 (90%N). Ua negative. Blood cx NGTD at 24hr. He received IVF hydration, empiric antibiotics of vancomycin plus levofloxacin. pulm critical care asking for ID consultation given that this is the 2nd episode in the last 4 months. He has improvement in erythema since starting antibiotics 48hrs ago, still warm to touch but improved vital signs.  His MAR lists cephalexin and penicillin with nausea and vomiting. Upon further questioning, as a child/adolescent he had rash, nausea,vomiting and throat swelling to penicillin. They are unclear if cephalosporins were an issue. Has not had either drug class since being a teenager. He denies any frequent infections such as sinusitis, pneumonia or skin infections. He occasionally takes antibiotics every other winter for sinusitis.   Past Medical History  Diagnosis Date  . Palpitations   . Hyperplasia of prostate   . Other and unspecified hyperlipidemia   . Obesity   . Fatty liver   . Essential hypertension, benign     Allergies:  Allergies  Allergen Reactions  . Cephalexin Nausea And Vomiting  . Penicillins Nausea And Vomiting and Rash      MEDICATIONS: . enoxaparin (LOVENOX) injection  90 mg  Subcutaneous Daily  . levofloxacin (LEVAQUIN) IV  750 mg Intravenous QAC breakfast  . simvastatin  10 mg Oral q1800  . sodium chloride  3 mL Intravenous Q12H  . vancomycin  1,500 mg Intravenous Q12H    History  Substance Use Topics  . Smoking status: Never Smoker   . Smokeless tobacco: Never Used  . Alcohol Use: Yes     Comment: occasionally    Family History  Problem Relation Age of Onset  . Stroke Father   . Atrial fibrillation Father     Review of Systems . Constitutional: posiitive for fever, chills, diaphoresis, activity change, appetite change, fatigue and unexpected weight change.  HENT: Negative for congestion, sore throat, rhinorrhea, sneezing, trouble swallowing and sinus pressure.  Eyes: Negative for photophobia and visual disturbance.  Respiratory: Negative for cough, chest tightness, shortness of breath, wheezing and stridor.  Cardiovascular: Negative for chest pain, palpitations and leg swelling.  Gastrointestinal: Negative for nausea, vomiting, abdominal pain, diarrhea, constipation, blood in stool, abdominal distention and anal bleeding.  Genitourinary: Negative for dysuria, hematuria, flank pain and difficulty urinating.  Musculoskeletal: Negative for myalgias, back pain, joint swelling, arthralgias and gait problem.  Skin:positive rash to left lower leg Neurological: Negative for dizziness, tremors, weakness and light-headedness.  Hematological: Negative for adenopathy. Does not bruise/bleed easily.  Psychiatric/Behavioral: Negative for behavioral problems, confusion, sleep disturbance, dysphoric mood, decreased concentration and agitation.      OBJECTIVE: Temp:  [97.6 F (36.4  C)-100.6 F (38.1 C)] 98.6 F (37 C) (02/29 1116) Pulse Rate:  [105-124] 105 (02/29 0845) Resp:  [13-32] 13 (02/29 0845) BP: (103-160)/(38-87) 160/82 mmHg (02/29 1220) SpO2:  [95 %-100 %] 97 % (02/29 1220) Weight:  [413 lb (187.336 kg)] 413 lb (187.336 kg) (02/29 0615) Physical  Exam  Constitutional: He is oriented to person, place, and time. He appears well-developed and well-nourished. No distress.  HENT:  Mouth/Throat: Oropharynx is clear and moist. No oropharyngeal exudate.  Cardiovascular: Normal rate, regular rhythm and normal heart sounds. Exam reveals no gallop and no friction rub.  No murmur heard.  Pulmonary/Chest: Effort normal and breath sounds normal. No respiratory distress. He has no wheezes.  Abdominal: Soft. Bowel sounds are normal. He exhibits no distension. There is no tenderness.  Lymphadenopathy:  He has no cervical adenopathy.  Neurological: He is alert and oriented to person, place, and time.  Skin: Skin is warm and dry. Left lower leg is outlined. Blanching trace erythema improved from outline. Warmer to touch than unaffected area Psychiatric: He has a normal mood and affect. His behavior is normal.     LABS: Results for orders placed or performed during the hospital encounter of 11/03/14 (from the past 48 hour(s))  Culture, blood (routine x 2)     Status: None (Preliminary result)   Collection Time: 11/03/14  2:45 AM  Result Value Ref Range   Specimen Description BLOOD RIGHT HAND    Special Requests BOTTLES DRAWN AEROBIC AND ANAEROBIC 5ML    Culture             BLOOD CULTURE RECEIVED NO GROWTH TO DATE CULTURE WILL BE HELD FOR 5 DAYS BEFORE ISSUING A FINAL NEGATIVE REPORT Performed at [facility]    Report Status PENDING   Culture, blood (routine x 2)     Status: None (Preliminary result)   Collection Time: 11/03/14  2:45 AM  Result Value Ref Range   Specimen Description BLOOD RIGHT UPPER ARM    Special Requests BOTTLES DRAWN AEROBIC AND ANAEROBIC 5ML    Culture             BLOOD CULTURE RECEIVED NO GROWTH TO DATE CULTURE WILL BE HELD FOR 5 DAYS BEFORE ISSUING A FINAL NEGATIVE REPORT Performed at [facility]    Report Status PENDING   CBC with Differential/Platelet     Status: Abnormal   Collection Time:  11/03/14  2:45 AM  Result Value Ref Range   WBC 16.6 (H) 4.0 - 10.5 K/uL   RBC 4.87 4.22 - 5.81 MIL/uL   Hemoglobin 15.3 13.0 - 17.0 g/dL   HCT 45.8 39.0 - 52.0 %   MCV 94.0 78.0 - 100.0 fL   MCH 31.4 26.0 - 34.0 pg   MCHC 33.4 30.0 - 36.0 g/dL   RDW 14.3 11.5 - 15.5 %   Platelets 323 150 - 400 K/uL   Neutrophils Relative % 90 (H) 43 - 77 %   Neutro Abs 14.9 (H) 1.7 - 7.7 K/uL   Lymphocytes Relative 7 (L) 12 - 46 %   Lymphs Abs 1.2 0.7 - 4.0 K/uL   Monocytes Relative 3 3 - 12 %   Monocytes Absolute 0.5 0.1 - 1.0 K/uL   Eosinophils Relative 0 0 - 5 %   Eosinophils Absolute 0.0 0.0 - 0.7 K/uL   Basophils Relative 0 0 - 1 %   Basophils Absolute 0.0 0.0 - 0.1 K/uL  I-Stat CG4 Lactic Acid, ED  Status: Abnormal   Collection Time: 11/03/14  4:15 AM  Result Value Ref Range   Lactic Acid, Venous 4.55 (HH) 0.5 - 2.0 mmol/L   Comment NOTIFIED PHYSICIAN   I-stat chem 8, ed     Status: Abnormal   Collection Time: 11/03/14  4:15 AM  Result Value Ref Range   Sodium 141 135 - 145 mmol/L   Potassium 4.2 3.5 - 5.1 mmol/L   Chloride 101 96 - 112 mmol/L   BUN 27 (H) 6 - 23 mg/dL   Creatinine, Ser 1.00 0.50 - 1.35 mg/dL   Glucose, Bld 98 70 - 99 mg/dL   Calcium, Ion 1.13 1.12 - 1.23 mmol/L   TCO2 27 0 - 100 mmol/L   Hemoglobin 16.7 13.0 - 17.0 g/dL   HCT 49.0 39.0 - 52.0 %  Urinalysis, Routine w reflex microscopic     Status: None   Collection Time: 11/03/14  4:15 AM  Result Value Ref Range   Color, Urine YELLOW YELLOW   APPearance CLEAR CLEAR   Specific Gravity, Urine 1.014 1.005 - 1.030   pH 5.0 5.0 - 8.0   Glucose, UA NEGATIVE NEGATIVE mg/dL   Hgb urine dipstick NEGATIVE NEGATIVE   Bilirubin Urine NEGATIVE NEGATIVE   Ketones, ur NEGATIVE NEGATIVE mg/dL   Protein, ur NEGATIVE NEGATIVE mg/dL   Urobilinogen, UA 0.2 0.0 - 1.0 mg/dL   Nitrite NEGATIVE NEGATIVE   Leukocytes, UA NEGATIVE NEGATIVE    Comment: MICROSCOPIC NOT DONE ON URINES WITH NEGATIVE PROTEIN, BLOOD, LEUKOCYTES,  NITRITE, OR GLUCOSE <1000 mg/dL.  Urine rapid drug screen (hosp performed)     Status: None   Collection Time: 11/03/14  4:54 AM  Result Value Ref Range   Opiates NONE DETECTED NONE DETECTED   Cocaine NONE DETECTED NONE DETECTED   Benzodiazepines NONE DETECTED NONE DETECTED   Amphetamines NONE DETECTED NONE DETECTED   Tetrahydrocannabinol NONE DETECTED NONE DETECTED   Barbiturates NONE DETECTED NONE DETECTED    Comment:        DRUG SCREEN FOR MEDICAL PURPOSES ONLY.  IF CONFIRMATION IS NEEDED FOR ANY PURPOSE, NOTIFY LAB WITHIN 5 DAYS.        LOWEST DETECTABLE LIMITS FOR URINE DRUG SCREEN Drug Class       Cutoff (ng/mL) Amphetamine      1000 Barbiturate      200 Benzodiazepine   675 Tricyclics       916 Opiates          300 Cocaine          300 THC              50   I-Stat CG4 Lactic Acid, ED     Status: Abnormal   Collection Time: 11/03/14  5:12 AM  Result Value Ref Range   Lactic Acid, Venous 2.08 (HH) 0.5 - 2.0 mmol/L   Comment NOTIFIED PHYSICIAN   MRSA PCR Screening     Status: None   Collection Time: 11/03/14  5:47 AM  Result Value Ref Range   MRSA by PCR NEGATIVE NEGATIVE    Comment:        The GeneXpert MRSA Assay (FDA approved for NASAL specimens only), is one component of a comprehensive MRSA colonization surveillance program. It is not intended to diagnose MRSA infection nor to guide or monitor treatment for MRSA infections.   Comprehensive metabolic panel     Status: Abnormal   Collection Time: 11/03/14  7:15 AM  Result Value Ref Range   Sodium 142  135 - 145 mmol/L   Potassium 3.4 (L) 3.5 - 5.1 mmol/L    Comment: RESULT REPEATED AND VERIFIED DELTA CHECK NOTED    Chloride 110 96 - 112 mmol/L    Comment: RESULT REPEATED AND VERIFIED DELTA CHECK NOTED    CO2 25 19 - 32 mmol/L   Glucose, Bld 88 70 - 99 mg/dL   BUN 21 6 - 23 mg/dL   Creatinine, Ser 1.12 0.50 - 1.35 mg/dL   Calcium 7.8 (L) 8.4 - 10.5 mg/dL   Total Protein 5.3 (L) 6.0 - 8.3 g/dL    Albumin 3.1 (L) 3.5 - 5.2 g/dL   AST 30 0 - 37 U/L   ALT 40 0 - 53 U/L   Alkaline Phosphatase 36 (L) 39 - 117 U/L   Total Bilirubin 1.1 0.3 - 1.2 mg/dL   GFR calc non Af Amer 82 (L) >90 mL/min   GFR calc Af Amer >90 >90 mL/min    Comment: (NOTE) The eGFR has been calculated using the CKD EPI equation. This calculation has not been validated in all clinical situations. eGFR's persistently <90 mL/min signify possible Chronic Kidney Disease.    Anion gap 7 5 - 15  Protime-INR     Status: None   Collection Time: 11/03/14  7:15 AM  Result Value Ref Range   Prothrombin Time 14.0 11.6 - 15.2 seconds   INR 1.06 0.00 - 1.49  Hemoglobin A1c     Status: Abnormal   Collection Time: 11/03/14  7:15 AM  Result Value Ref Range   Hgb A1c MFr Bld 5.7 (H) 4.8 - 5.6 %    Comment: (NOTE)         Pre-diabetes: 5.7 - 6.4         Diabetes: >6.4         Glycemic control for adults with diabetes: <7.0    Mean Plasma Glucose 117 mg/dL    Comment: (NOTE) Performed At: [facility] [place], [place] [id number] Lindon Romp MD [id number]   Sedimentation rate     Status: None   Collection Time: 11/03/14  7:15 AM  Result Value Ref Range   Sed Rate 8 0 - 16 mm/hr  C-reactive protein     Status: Abnormal   Collection Time: 11/03/14  7:15 AM  Result Value Ref Range   CRP 3.9 (H) <0.60 mg/dL    Comment: Performed at [facility]  Lactic acid, plasma     Status: Abnormal   Collection Time: 11/03/14 10:16 AM  Result Value Ref Range   Lactic Acid, Venous 3.5 (HH) 0.5 - 2.0 mmol/L    Comment: CRITICAL RESULT CALLED TO, READ BACK BY AND VERIFIED WITH: ARNOLD,A AT 11:00AM ON 11/03/14 BY Derrill Memo Performed at [facility]   Clostridium Difficile by PCR     Status: None   Collection Time: 11/03/14  1:56 PM  Result Value Ref Range   C difficile by pcr NEGATIVE NEGATIVE    Comment: Performed at [facility]  Lactic acid, plasma     Status: Abnormal    Collection Time: 11/03/14  2:54 PM  Result Value Ref Range   Lactic Acid, Venous 3.2 (HH) 0.5 - 2.0 mmol/L    Comment: CRITICAL RESULT CALLED TO, READ BACK BY AND VERIFIED WITH: ARNOLD,A AT 3:40PM ON 11/03/14 BY [facility] Performed at [facility]   CBC     Status: Abnormal   Collection Time: 11/04/14  7:30 AM  Result Value  Ref Range   WBC 24.3 (H) 4.0 - 10.5 K/uL   RBC 4.14 (L) 4.22 - 5.81 MIL/uL   Hemoglobin 13.2 13.0 - 17.0 g/dL    Comment: REPEATED TO VERIFY DELTA CHECK NOTED    HCT 38.8 (L) 39.0 - 52.0 %   MCV 93.7 78.0 - 100.0 fL   MCH 31.9 26.0 - 34.0 pg   MCHC 34.0 30.0 - 36.0 g/dL   RDW 15.0 11.5 - 15.5 %   Platelets 212 150 - 400 K/uL    Comment: SPECIMEN CHECKED FOR CLOTS REPEATED TO VERIFY DELTA CHECK NOTED   Comprehensive metabolic panel     Status: Abnormal   Collection Time: 11/04/14  7:30 AM  Result Value Ref Range   Sodium 139 135 - 145 mmol/L   Potassium 4.5 3.5 - 5.1 mmol/L    Comment: RESULT REPEATED AND VERIFIED DELTA CHECK NOTED SLIGHT HEMOLYSIS    Chloride 107 96 - 112 mmol/L   CO2 25 19 - 32 mmol/L   Glucose, Bld 97 70 - 99 mg/dL   BUN 13 6 - 23 mg/dL   Creatinine, Ser 0.92 0.50 - 1.35 mg/dL   Calcium 7.7 (L) 8.4 - 10.5 mg/dL   Total Protein 5.3 (L) 6.0 - 8.3 g/dL   Albumin 2.8 (L) 3.5 - 5.2 g/dL   AST 43 (H) 0 - 37 U/L   ALT 40 0 - 53 U/L   Alkaline Phosphatase 57 39 - 117 U/L   Total Bilirubin 1.6 (H) 0.3 - 1.2 mg/dL   GFR calc non Af Amer >90 >90 mL/min   GFR calc Af Amer >90 >90 mL/min    Comment: (NOTE) The eGFR has been calculated using the CKD EPI equation. This calculation has not been validated in all clinical situations. eGFR's persistently <90 mL/min signify possible Chronic Kidney Disease.    Anion gap 7 5 - 15  Lactic acid, plasma     Status: None   Collection Time: 11/04/14  7:30 AM  Result Value Ref Range   Lactic Acid, Venous 1.1 0.5 - 2.0 mmol/L  Sedimentation rate     Status: Abnormal   Collection Time:  11/04/14 10:45 AM  Result Value Ref Range   Sed Rate 50 (H) 0 - 16 mm/hr    MICRO: 2/28 blood cx ngtd 2/28 diarrhea c.difficile  IMAGING: Dg Chest Port 1 View  11/03/2014   CLINICAL DATA:  Acute respiratory failure  EXAM: PORTABLE CHEST - 1 VIEW  COMPARISON:  Radiograph 10/14/2014  FINDINGS: Normal cardiac silhouette. There is mild central venous pulmonary congestion. No effusion, infiltrate, or pneumothorax. No acute osseous abnormality.  IMPRESSION: Central venous congestion.   Electronically Signed   By: Suzy Bouchard M.D.   On: 11/03/2014 10:37   Dg Chest Port 1 View  11/03/2014   CLINICAL DATA:  Shortness of breath, tachycardia, fever.  EXAM: PORTABLE CHEST - 1 VIEW  COMPARISON:  07/25/2014  FINDINGS: Heart is at the upper limits of normal. Pulmonary vasculature is normal for technique. No consolidation, pleural effusion or pneumothorax. No acute osseous abnormalities are seen.  IMPRESSION: No acute pulmonary process.   Electronically Signed   By: Jeb Levering M.D.   On: 11/03/2014 05:25    Assessment/Plan:  39yo M with severe sepsis from cellulitis. 2nd episode in 4 months. currently on vancomycin and levofloxacin. Has hx of anaphylaxis with pcn.  Cellulitis of left leg= appears improving. Recommend to keep on vancomycin alone and discontinue levofloxacin.  Presentation most likely due  to staph or strep. If having slow response, consider ultrasound of left lower leg to ensure no abscess. once he improves, can consider changing to oral doxycycline or clindamycin. However, clindamycin has estimated 15-20% resistance with streptococcal infections. Will follow up on his blood cx to ensure that he does not have concurrent bacteremia. Would treat for a total of 14 days, including days of IV therapy.  abtx pcn allergy =  Appears that he may have had anyphalaxis like reaction to penicillin as a child/adolescent, but not sure if he has been challenged by cephalosporins or has had cross  reactivity. Recommend that he follows up with allergy/immuniology to consider testing. This may be of use since he is young and probably would have further hospitalizations where cephalosporins would be of use for treatment. Alternatively, can also consider desensitization to cephalosporins during this hospitalization  Health maintenance = will check hiv  Leukocytosis = will likely trend down after more days of antibiotics  Izamar Linden [facility] [phone]

## 2014-11-04 NOTE — Progress Notes (Signed)
PULMONARY / CRITICAL CARE MEDICINE   Name: Corey Wilcox MRN: [id number] DOB: 23-Sep-1975    ADMISSION DATE:  11/03/2014 CONSULTATION DATE:  11/03/14  REFERRING MD :  Risa Grill  CHIEF COMPLAINT:  Shock  INITIAL PRESENTATION: Admitted on 2/28 to [facility] with septic shock from cellulitis.  STUDIES:    SIGNIFICANT EVENTS:   SUBJECTIVE:   VITAL SIGNS: Temp:  [97.6 F (36.4 C)-100.6 F (38.1 C)] 97.6 F (36.4 C) (02/29 0725) Pulse Rate:  [107-126] 107 (02/29 0615) Resp:  [14-32] 32 (02/29 0615) BP: (73-158)/(25-87) 158/84 mmHg (02/29 0615) SpO2:  [95 %-100 %] 99 % (02/29 0615) Weight:  [187.336 kg (413 lb)] 187.336 kg (413 lb) (02/29 0615) HEMODYNAMICS:   VENTILATOR SETTINGS:   INTAKE / OUTPUT:  Intake/Output Summary (Last 24 hours) at 11/04/14 0843 Last data filed at 11/04/14 0600  Gross per 24 hour  Intake   3730 ml  Output    960 ml  Net   2770 ml    PHYSICAL EXAMINATION: Gen: comfortable in bed HEENT: NCAT,  EOMi, OP clear, neck supple without masses PULM: CTA B (difficult exam due to obesity) CV: RRR, no clear murmur but difficult exam AB: BS+, soft, nontender, no hsm Ext: warm, trace edema, no clubbing, no cyanosis, see derm Derm: left lower extremity with non-raised diffuse warmth and redness circumferential to the distal leg below the calf and above the ankle, marked w/ some increase on boarder  Neuro: A&Ox4, CN II-XII intact, MAEW   LABS:  CBC  Recent Labs Lab 11/03/14 0245 11/03/14 0415 11/04/14 0730  WBC 16.6*  --  24.3*  HGB 15.3 16.7 13.2  HCT 45.8 49.0 38.8*  PLT 323  --  212   Coag's  Recent Labs Lab 11/03/14 0715  INR 1.06   BMET  Recent Labs Lab 11/03/14 0415 11/03/14 0715 11/04/14 0730  NA 141 142 139  K 4.2 3.4* 4.5  CL 101 110 107  CO2  --  25 25  BUN 27* 21 13  CREATININE 1.00 1.12 0.92  GLUCOSE 98 88 97   Electrolytes  Recent Labs Lab 11/03/14 0715 11/04/14 0730  CALCIUM 7.8* 7.7*   Sepsis  Markers  Recent Labs Lab 11/03/14 1016 11/03/14 1454 11/04/14 0730  LATICACIDVEN 3.5* 3.2* 1.1   ABG No results for input(s): PHART, PCO2ART, PO2ART in the last 168 hours. Liver Enzymes  Recent Labs Lab 11/03/14 0715 11/04/14 0730  AST 30 43*  ALT 40 40  ALKPHOS 36* 57  BILITOT 1.1 1.6*  ALBUMIN 3.1* 2.8*   Cardiac Enzymes No results for input(s): TROPONINI, PROBNP in the last 168 hours. Glucose No results for input(s): GLUCAP in the last 168 hours.  Imaging Dg Chest Port 1 View  11/03/2014   CLINICAL DATA:  Acute respiratory failure  EXAM: PORTABLE CHEST - 1 VIEW  COMPARISON:  Radiograph 10/14/2014  FINDINGS: Normal cardiac silhouette. There is mild central venous pulmonary congestion. No effusion, infiltrate, or pneumothorax. No acute osseous abnormality.  IMPRESSION: Central venous congestion.   Electronically Signed   By: Genevive Bi M.D.   On: 11/03/2014 10:37   Dg Chest Port 1 View  11/03/2014   CLINICAL DATA:  Shortness of breath, tachycardia, fever.  EXAM: PORTABLE CHEST - 1 VIEW  COMPARISON:  07/25/2014  FINDINGS: Heart is at the upper limits of normal. Pulmonary vasculature is normal for technique. No consolidation, pleural effusion or pneumothorax. No acute osseous abnormalities are seen.  IMPRESSION: No acute pulmonary process.   Electronically Signed  By: Rubye OaksMelanie  Ehinger M.D.   On: 11/03/2014 05:25     ASSESSMENT / PLAN:  PULMONARY A: Acute respiratory failure with hypoxemia > mild hypoxemia likely due to atelectasis, at risk for pulmonary edema due to volume resuscitation P:   -O2 as needed for O2 saturation > 92%  CARDIOVASCULAR A: Septic shock> in setting of cellulitis. Now off pressors  P:  Cont IVFs to ensure euvolemia  See ID section  ECHO   RENAL A:  Hypokalemia-->resolved  P:   -Monitor BMET and UOP -Replace electrolytes as needed   GASTROINTESTINAL A:  No acute issues P:   -regular diet  HEMATOLOGIC A:  No acute issues P:   -monitor for bleeding  INFECTIOUS A:   Septic shock due to cellulitis> exam seems consistent with strep pyogenes, but agree with staph coverage given prevelence in community; No palpable crepitance or pain to suggest necrotizing infection P:   BCx2 2/28 >> Vanc 2/28 >> Levaquin 2/28 >> ID consult.. Recurrent sepsis  Monitor derm exam Given second severe reaction > why does this keep recurring when there is no clear port of entry (skin breakdown, etc)? Have sent Lupus screening eval given sister w/ Lupus.   ENDOCRINE A:  No acute issues P:   -check Hgb A1c to ensure no DM2 given obesity and unexplained recurrent infections  NEUROLOGIC A:  No acute issues P:     FAMILY  Updated at bedside 2/29   TODAY'S SUMMARY:  39 y/o male with septic shock due to cellulitis again for second time in 4 months.  Leg looks a little worse but all hemodynamics have improved and he is off pressors. We will s/o. For completeness we will order ECHO, need to f/u Hgb A1C ? Poorly controlled DM as a reason for recurrent infections. He has a sister w/ Lupus. If he has as well this could result in being more prone to infection. Have sent screening eval. Also will ask ID to see.   Simonne MartinetPeter E Babcock ACNP-BC [facility] Pager # [phone] OR # [phone] if no answer  Attending Note:  I have examined patient, reviewed labs, studies and notes. I have discussed the case with Kreg ShropshireP Babcock, and I agree with the data and plans as amended above.   Levy Pupaobert Kailea Dannemiller, MD, PhD 11/04/2014, 11:48 AM [facility] [phone] or if no answer [phone]

## 2014-11-05 DIAGNOSIS — B9689 Other specified bacterial agents as the cause of diseases classified elsewhere: Secondary | ICD-10-CM

## 2014-11-05 DIAGNOSIS — R7309 Other abnormal glucose: Secondary | ICD-10-CM

## 2014-11-05 LAB — BASIC METABOLIC PANEL
Anion gap: 6 (ref 5–15)
BUN: 10 mg/dL (ref 6–23)
CO2: 27 mmol/L (ref 19–32)
CREATININE: 0.84 mg/dL (ref 0.50–1.35)
Calcium: 7.7 mg/dL — ABNORMAL LOW (ref 8.4–10.5)
Chloride: 103 mmol/L (ref 96–112)
GLUCOSE: 103 mg/dL — AB (ref 70–99)
POTASSIUM: 3.3 mmol/L — AB (ref 3.5–5.1)
Sodium: 136 mmol/L (ref 135–145)

## 2014-11-05 LAB — CBC
HEMATOCRIT: 39.4 % (ref 39.0–52.0)
HEMOGLOBIN: 13.4 g/dL (ref 13.0–17.0)
MCH: 31.7 pg (ref 26.0–34.0)
MCHC: 34 g/dL (ref 30.0–36.0)
MCV: 93.1 fL (ref 78.0–100.0)
PLATELETS: 239 10*3/uL (ref 150–400)
RBC: 4.23 MIL/uL (ref 4.22–5.81)
RDW: 14.8 % (ref 11.5–15.5)
WBC: 13.2 10*3/uL — AB (ref 4.0–10.5)

## 2014-11-05 LAB — C3 COMPLEMENT: C3 COMPLEMENT: 148 mg/dL (ref 82–167)

## 2014-11-05 LAB — VANCOMYCIN, TROUGH: Vancomycin Tr: 8.5 ug/mL — ABNORMAL LOW (ref 10.0–20.0)

## 2014-11-05 LAB — C4 COMPLEMENT: Complement C4, Body Fluid: 21 mg/dL (ref 14–44)

## 2014-11-05 MED ORDER — VANCOMYCIN HCL 10 G IV SOLR
1750.0000 mg | Freq: Two times a day (BID) | INTRAVENOUS | Status: DC
  Administered 2014-11-06: 1750 mg via INTRAVENOUS
  Filled 2014-11-05 (×2): qty 1750

## 2014-11-05 NOTE — Progress Notes (Addendum)
PROGRESS NOTE  Corey Wilcox QMV:[id number] DOB: 1975/12/28 DOA: 11/03/2014 PCP: Rudi Heap, MD  HPI: 39 year old gentleman with history of hypertension and obesity and fatty liver, presented with septic shock in the setting of left lower extremity cellulitis.   This is his second episode of cellulitis requiring ICU stay in 3 months   Subjective / 24 H Interval events - He was transferred to the floor last night, stable overnight - Complains of a mild headache and mild abdominal discomfort - Left lower extremity cellulitis with improvement  Assessment/Plan: Principal Problem:   Sepsis due to cellulitis Active Problems:   Obesity   Essential hypertension, benign   Cellulitis of left lower extremity   Blood poisoning   Septic shock   Septic shock  - 2 to cellulitis, improved after antibiotics, now stable on the floor  LLE cellulitis - Second episode of septic shock due to cellulitis in the same area without apparent source, consulted ID, appreciate input - Continues to improve today with vancomycin, continue current regimen  - Transition to oral antibiotics on 3/2 if he has continuous improvement and may be able to go home - Is somewhat unclear as to why he's had 2 episodes of cellulitis in the same area requiring an ICU stay as well as vasopressors. - family history of Lupus, complements normal, ANA pending, antiphospholipid pending - He has prediabetes, which puts him at slightly higher risk of having this type of infections, I have extensively discussed about this today with him and his mother. Maryclare Labrador check an a.m. cortisol tomorrow morning to rule out a component of adrenal insufficiency for why he is getting so hypotensive with cellulitis  Obesity - counseled, extensive discussion today with him and his mother regarding his diet, weight loss, as well as improvement for his prediabetes  HTN - if persistently high will restart his home Benicar  HLD - on  Simvastatin   Diet: Diet regular Fluids: none DVT Prophylaxis: Lovenox  Code Status: Full Code Family Communication: d/w mother bedside Disposition Plan: home when ready   Consultants:  PCCM  ID  Procedures:  None    Antibiotics Vancomycin 2/28 >> Levofloxacin 2/28 >> 2/29   Studies  No results found.  Objective  Filed Vitals:   11/04/14 1425 11/04/14 2128 11/05/14 0500 11/05/14 0608  BP: 130/69 122/73  135/79  Pulse: 106 118  96  Temp: 99 F (37.2 C) 99.8 F (37.7 C)  98.3 F (36.8 C)  TempSrc: Oral Oral  Oral  Resp: Height:      Weight:   186.428 kg (411 lb)   SpO2: 98% 90%  93%    Intake/Output Summary (Last 24 hours) at 11/05/14 1314 Last data filed at 11/05/14 0900  Gross per 24 hour  Intake   1400 ml  Output   2925 ml  Net  -1525 ml   Filed Weights   11/03/14 0540 11/04/14 0615 11/05/14 0500  Weight: 185.8 kg (409 lb 9.8 oz) 187.336 kg (413 lb) 186.428 kg (411 lb)    Exam:  General:  NAD, morbidly obese male  HEENT: no scleral icterus, PERRL  Cardiovascular: RRR without MRG  Respiratory: CTA biL  Abdomen: soft, obese, non tender  MSK/Extremities: left lower extremity with cellulitic appearance above ankle to below knee, marked, improved  Skin: no other rashes  Neuro: non focal   Data Reviewed: Basic Metabolic Panel:  Recent Labs Lab 11/03/14 0415 11/03/14 0715 11/04/14 0730 11/05/14 0700  NA  141 142 139 136  K 4.2 3.4* 4.5 3.3*  CL 101 110 107 103  CO2  --  25 25 27   GLUCOSE 98 88 97 103*  BUN 27* 21 13 10   CREATININE 1.00 1.12 0.92 0.84  CALCIUM  --  7.8* 7.7* 7.7*   Liver Function Tests:  Recent Labs Lab 11/03/14 0715 11/04/14 0730  AST 30 43*  ALT 40 40  ALKPHOS 36* 57  BILITOT 1.1 1.6*  PROT 5.3* 5.3*  ALBUMIN 3.1* 2.8*   CBC:  Recent Labs Lab 11/03/14 0245 11/03/14 0415 11/04/14 0730 11/05/14 0700  WBC 16.6*  --  24.3* 13.2*  NEUTROABS 14.9*  --   --   --   HGB 15.3 16.7 13.2  13.4  HCT 45.8 49.0 38.8* 39.4  MCV 94.0  --  93.7 93.1  PLT 323  --  212 239    Recent Results (from the past 240 hour(s))  Culture, blood (routine x 2)     Status: None (Preliminary result)   Collection Time: 11/03/14  2:45 AM  Result Value Ref Range Status   Specimen Description BLOOD RIGHT HAND  Final   Special Requests BOTTLES DRAWN AEROBIC AND ANAEROBIC 5ML  Final   Culture   Final           BLOOD CULTURE RECEIVED NO GROWTH TO DATE CULTURE WILL BE HELD FOR 5 DAYS BEFORE ISSUING A FINAL NEGATIVE REPORT Performed at [facility]    Report Status PENDING  Incomplete  Culture, blood (routine x 2)     Status: None (Preliminary result)   Collection Time: 11/03/14  2:45 AM  Result Value Ref Range Status   Specimen Description BLOOD RIGHT UPPER ARM  Final   Special Requests BOTTLES DRAWN AEROBIC AND ANAEROBIC 5ML  Final   Culture   Final           BLOOD CULTURE RECEIVED NO GROWTH TO DATE CULTURE WILL BE HELD FOR 5 DAYS BEFORE ISSUING A FINAL NEGATIVE REPORT Performed at [facility]    Report Status PENDING  Incomplete  MRSA PCR Screening     Status: None   Collection Time: 11/03/14  5:47 AM  Result Value Ref Range Status   MRSA by PCR NEGATIVE NEGATIVE Final    Comment:        The GeneXpert MRSA Assay (FDA approved for NASAL specimens only), is one component of a comprehensive MRSA colonization surveillance program. It is not intended to diagnose MRSA infection nor to guide or monitor treatment for MRSA infections.   Clostridium Difficile by PCR     Status: None   Collection Time: 11/03/14  1:56 PM  Result Value Ref Range Status   C difficile by pcr NEGATIVE NEGATIVE Final    Comment: Performed at [facility]     Scheduled Meds: . enoxaparin (LOVENOX) injection  90 mg Subcutaneous Daily  . simvastatin  10 mg Oral q1800  . sodium chloride  3 mL Intravenous Q12H  . vancomycin  1,500 mg Intravenous Q12H   Continuous Infusions: . sodium  chloride 10 mL/hr at 11/04/14 1259    Time spent: 35 minutes, more than 50% involved in family discussions   Pamella Pertostin Gherghe, MD [facility] Pager [phone]. If 7 PM - 7 AM, please contact night-coverage at [email or web address], password [email or web address] 11/05/2014, 1:14 PM  LOS: 2 days

## 2014-11-05 NOTE — Progress Notes (Addendum)
INFECTIOUS DISEASE PROGRESS NOTE  ID: Corey Wilcox is a 39 y.o. male with  Principal Problem:   Sepsis due to cellulitis Active Problems:   Obesity   Essential hypertension, benign   Cellulitis of left lower extremity   Blood poisoning   Septic shock  Subjective: Some pruritis of his leg  Abtx:  Anti-infectives    Start     Dose/Rate Route Frequency Ordered Stop   11/06/14 0000  vancomycin (VANCOCIN) 1,750 mg in sodium chloride 0.9 % 500 mL IVPB     1,750 mg 250 mL/hr over 120 Minutes Intravenous Every 12 hours 11/05/14 1559     11/03/14 1400  vancomycin (VANCOCIN) 1,500 mg in sodium chloride 0.9 % 500 mL IVPB     1,500 mg 250 mL/hr over 120 Minutes Intravenous Every 12 hours 11/03/14 0634 11/05/14 1800   11/03/14 0700  levofloxacin (LEVAQUIN) IVPB 750 mg  Status:  Discontinued     750 mg 100 mL/hr over 90 Minutes Intravenous Daily before breakfast 11/03/14 0626 11/04/14 1351   11/03/14 0315  vancomycin (VANCOCIN) 2,000 mg in sodium chloride 0.9 % 500 mL IVPB     2,000 mg 250 mL/hr over 120 Minutes Intravenous  Once 11/03/14 0249 11/03/14 0524      Medications:  Scheduled: . enoxaparin (LOVENOX) injection  90 mg Subcutaneous Daily  . simvastatin  10 mg Oral q1800  . sodium chloride  3 mL Intravenous Q12H  . vancomycin  1,500 mg Intravenous Q12H  . [START ON 11/06/2014] vancomycin  1,750 mg Intravenous Q12H    Objective: Vital signs in last 24 hours: Temp:  [98 F (36.7 C)-99.8 F (37.7 C)] 98 F (36.7 C) (03/01 1425) Pulse Rate:  [91-118] 91 (03/01 1425) Resp:  [12-23] 20 (03/01 1425) BP: (122-135)/(61-79) 125/61 mmHg (03/01 1425) SpO2:  [90 %-95 %] 95 % (03/01 1425) Weight:  [186.428 kg (411 lb)] 186.428 kg (411 lb) (03/01 0500)   General appearance: alert, cooperative and no distress Resp: clear to auscultation bilaterally Cardio: regular rate and rhythm GI: normal findings: bowel sounds normal and soft, non-tender Extremities: L calf with  circumferential erythema, heat. within previous line. non-tender. there is no skni break down. there is no onychomycosis.   Lab Results  Recent Labs  11/04/14 0730 11/05/14 0700  WBC 24.3* 13.2*  HGB 13.2 13.4  HCT 38.8* 39.4  NA 139 136  K 4.5 3.3*  CL 107 103  CO2 25 27  BUN 13 10  CREATININE 0.92 0.84   Liver Panel  Recent Labs  11/03/14 0715 11/04/14 0730  PROT 5.3* 5.3*  ALBUMIN 3.1* 2.8*  AST 30 43*  ALT 40 40  ALKPHOS 36* 57  BILITOT 1.1 1.6*   Sedimentation Rate  Recent Labs  11/04/14 1045  ESRSEDRATE 50*   C-Reactive Protein  Recent Labs  11/03/14 0715  CRP 3.9*    Microbiology: Recent Results (from the past 240 hour(s))  Culture, blood (routine x 2)     Status: None (Preliminary result)   Collection Time: 11/03/14  2:45 AM  Result Value Ref Range Status   Specimen Description BLOOD RIGHT HAND  Final   Special Requests BOTTLES DRAWN AEROBIC AND ANAEROBIC  Final   Culture   Final           BLOOD CULTURE RECEIVED NO GROWTH TO DATE CULTURE WILL BE HELD FOR 5 DAYS BEFORE ISSUING A FINAL NEGATIVE REPORT Performed at [facility]    Report Status PENDING  Incomplete  Culture, blood (routine x 2)     Status: None (Preliminary result)   Collection Time: 11/03/14  2:45 AM  Result Value Ref Range Status   Specimen Description BLOOD RIGHT UPPER ARM  Final   Special Requests BOTTLES DRAWN AEROBIC AND ANAEROBIC 5ML  Final   Culture   Final           BLOOD CULTURE RECEIVED NO GROWTH TO DATE CULTURE WILL BE HELD FOR 5 DAYS BEFORE ISSUING A FINAL NEGATIVE REPORT Performed at [facility]    Report Status PENDING  Incomplete  MRSA PCR Screening     Status: None   Collection Time: 11/03/14  5:47 AM  Result Value Ref Range Status   MRSA by PCR NEGATIVE NEGATIVE Final    Comment:        The GeneXpert MRSA Assay (FDA approved for NASAL specimens only), is one component of a comprehensive MRSA colonization surveillance program. It is  not intended to diagnose MRSA infection nor to guide or monitor treatment for MRSA infections.   Clostridium Difficile by PCR     Status: None   Collection Time: 11/03/14  1:56 PM  Result Value Ref Range Status   C difficile by pcr NEGATIVE NEGATIVE Final    Comment: Performed at [facility]    Studies/Results: No results found.   Assessment/Plan: Cellulitis LLE, Sepsis Obesity HTN EF 50-55%  Total days of antibiotics 3/14 (vanco)   Appears to be improving.  KEEP LEG ELEVATED!  HIV pending Could consider compression stockings, low dose clinda for prevention of cellulitis. Will need wt loss as well. Agree with considering allergy eval for PEN desensitization, low dose PEN would be better choice than clinda.  BCx are ngtd       Johny SaxJeffrey Filomeno [facility] Infectious Diseases (pager) [phone] [email or web address] 11/05/2014, 4:07 PM  LOS: 2 days

## 2014-11-05 NOTE — Progress Notes (Addendum)
ANTIBIOTIC CONSULT NOTE - FOLLOW-UP  Pharmacy Consult for Vancomycin Indication: Septic shock secondary to Cellulitis  Allergies  Allergen Reactions  . Penicillins Swelling and Rash    Throat swelling, with rash, n/v as a child/adolescent  . Cephalexin Nausea And Vomiting    Patient Measurements: Height: 5\' 7"  (170.2 cm) Weight: (!) 411 lb (186.428 kg) IBW/kg (Calculated) : 66.1   Vital Signs: Temp: 98 F (36.7 C) (03/01 1425) Temp Source: Oral (03/01 1425) BP: 125/61 mmHg (03/01 1425) Pulse Rate: 91 (03/01 1425) Intake/Output from previous day: 02/29 0701 - 03/01 0700 In: 1593 [P.O.:240; I.V.:203; IV Piggyback:1150] Out: 2932 [Urine:2929; Stool:3] Intake/Output from this shift: Total I/O In: 480 [P.O.:480] Out: -   Labs:  Recent Labs  11/03/14 0245  11/03/14 0415 11/03/14 0715 11/04/14 0730 11/05/14 0700  WBC 16.6*  --   --   --  24.3* 13.2*  HGB 15.3  --  16.7  --  13.2 13.4  PLT 323  --   --   --  212 239  CREATININE  --   < > 1.00 1.12 0.92 0.84  < > = values in this interval not displayed. Estimated Creatinine Clearance: 192.6 mL/min (by C-G formula based on Cr of 0.84).  Recent Labs  11/05/14 1435  VANCOTROUGH 8.5*     Microbiology: Recent Results (from the past 720 hour(s))  Culture, blood (routine x 2)     Status: None (Preliminary result)   Collection Time: 11/03/14  2:45 AM  Result Value Ref Range Status   Specimen Description BLOOD RIGHT HAND  Final   Special Requests BOTTLES DRAWN AEROBIC AND ANAEROBIC 5ML  Final   Culture   Final           BLOOD CULTURE RECEIVED NO GROWTH TO DATE CULTURE WILL BE HELD FOR 5 DAYS BEFORE ISSUING A FINAL NEGATIVE REPORT Performed at [facility]    Report Status PENDING  Incomplete  Culture, blood (routine x 2)     Status: None (Preliminary result)   Collection Time: 11/03/14  2:45 AM  Result Value Ref Range Status   Specimen Description BLOOD RIGHT UPPER ARM  Final   Special Requests BOTTLES  DRAWN AEROBIC AND ANAEROBIC 5ML  Final   Culture   Final           BLOOD CULTURE RECEIVED NO GROWTH TO DATE CULTURE WILL BE HELD FOR 5 DAYS BEFORE ISSUING A FINAL NEGATIVE REPORT Performed at [facility]    Report Status PENDING  Incomplete  MRSA PCR Screening     Status: None   Collection Time: 11/03/14  5:47 AM  Result Value Ref Range Status   MRSA by PCR NEGATIVE NEGATIVE Final    Comment:        The GeneXpert MRSA Assay (FDA approved for NASAL specimens only), is one component of a comprehensive MRSA colonization surveillance program. It is not intended to diagnose MRSA infection nor to guide or monitor treatment for MRSA infections.   Clostridium Difficile by PCR     Status: None   Collection Time: 11/03/14  1:56 PM  Result Value Ref Range Status   C difficile by pcr NEGATIVE NEGATIVE Final    Comment: Performed at [facility]    Medical History: Past Medical History  Diagnosis Date  . Palpitations   . Hyperplasia of prostate   . Other and unspecified hyperlipidemia   . Obesity   . Fatty liver   . Essential hypertension, benign  Assessment: 39 y/o obese male admitted 2/28 with fever and tachycardia, stated he felt this way last time he was admitted for sepsis (07/25/14 - 07/30/14). Outpatient notes following previous admission state patient has venous stasis dermatitis. Pt was noted to have LLE with erythema and warmth of 5cm in length between the calf and ankle in the ED this admit. Pharmacy consulted to dose vancomycin and levofloxacin for sepsis thought secondary to cellulitis. ID consulted and discontinued Levofloxacin 2/29, to continue with Vancomycin monotherapy. Per MD note, septic shock has improved and patient transferred to floor 2/29.  2/28 >> vancomycin >> 2/28 >> levofloxacin >> 2/29  Tmax: 99.8 WBCs: elevated, trending down, 13.2K Renal: SCr 0.84, CrCl  > 100 mL/min N Lactic acid: 4.55 > 2.08 > 3.5 > 3.2 > 1.1  Vancomycin trough  level: 8.5 mcg/mL  2/28 blood x2: NGTD 2/28 MRSA PCR: negative 2/28 C. Diff PCR: negative  Goal of Therapy:  Vancomycin trough level 15-20 mcg/mL Appropriate antibiotic dosing for renal function and indication Eradication of infection  Plan:   Increase Vancomycin to 1750 mg IV q12h.  Continue to monitor renal function, cultures, clinical course.  Noted possible transition to PO antibiotics on 3/2 if patient continues to improve.   Greer Pickerel, PharmD, BCPS Pager: [phone] 11/05/2014 3:53 PM

## 2014-11-05 NOTE — Progress Notes (Signed)
Echocardiogram 2D Echocardiogram has been performed.  Corey Wilcox 11/05/2014, 10:39 AM

## 2014-11-06 LAB — HIV ANTIBODY (ROUTINE TESTING W REFLEX): HIV SCREEN 4TH GENERATION: NONREACTIVE

## 2014-11-06 MED ORDER — DOXYCYCLINE HYCLATE 100 MG PO TABS: 100.0000 mg | ORAL_TABLET | Freq: Two times a day (BID) | ORAL | Status: DC

## 2014-11-06 MED ORDER — CLINDAMYCIN HCL 150 MG PO CAPS: 150.0000 mg | ORAL_CAPSULE | Freq: Every day | ORAL | Status: DC

## 2014-11-06 MED ORDER — DOXYCYCLINE HYCLATE 50 MG PO CAPS: 100.0000 mg | ORAL_CAPSULE | Freq: Two times a day (BID) | ORAL | Status: DC

## 2014-11-06 MED ORDER — DOXYCYCLINE HYCLATE 100 MG PO TABS
100.0000 mg | ORAL_TABLET | Freq: Two times a day (BID) | ORAL | Status: DC
  Filled 2014-11-06: qty 1

## 2014-11-06 NOTE — Progress Notes (Signed)
ANTIBIOTIC CONSULT NOTE - FOLLOW-UP  Pharmacy Consult for Doxycycline Indication: Cellulitis  Allergies  Allergen Reactions  . Penicillins Swelling and Rash    Throat swelling, with rash, n/v as a child/adolescent  . Cephalexin Nausea And Vomiting    Patient Measurements: Height: 5\' 7"  (170.2 cm) Weight: (!) 409 lb 4.8 oz (185.657 kg) IBW/kg (Calculated) : 66.1   Vital Signs: Temp: 97.6 F (36.4 C) (03/02 0446) Temp Source: Oral (03/02 0446) BP: 142/75 mmHg (03/02 0446) Pulse Rate: 73 (03/02 0446) Intake/Output from previous day: 03/01 0701 - 03/02 0700 In: 1960 [P.O.:960; IV Piggyback:1000] Out: -  Intake/Output from this shift: Total I/O In: 240 [P.O.:240] Out: -   Labs:  Recent Labs  11/04/14 0730 11/05/14 0700  WBC 24.3* 13.2*  HGB 13.2 13.4  PLT 212 239  CREATININE 0.92 0.84   Estimated Creatinine Clearance: 192.1 mL/min (by C-G formula based on Cr of 0.84).  Recent Labs  11/05/14 1435  VANCOTROUGH 8.5*    Assessment: 39 y/o obese male admitted 2/28 with fever and tachycardia, LLE with erythema and warmth on calf/ankle.  PMH significant for sepsis (07/25/14 - 07/30/14) and outpatient notes following previous admission state patient has venous stasis dermatitis.  Suspected to have sepsis secondary to cellulitis and pharmacy was initially consulted to dose Vancomycin and Levaquin.  He was narrowed to vancomycin alone on 2/29.  Now, Pharmacy is consulted to change from Vancomycin to Doxycycline on 3/2.  Anti-infectives: 2/28 >> vancomycin >> 3/2 2/28 >> levofloxacin >> 2/29 3/2 >> doxycycline >>  Today, 11/06/2014: Day # 4 antibiotics  Tmax: afebrile, 98  WBCs: significantly improved, 13.2 (3/1)  Renal: SCr 0.84, CrCl  > 100 mL/min  N (3/1)  Blood cultures remain NGTD   Goal of Therapy:  Appropriate antibiotic dosing for renal function and indication Eradication of infection  Plan:   Doxycycline 100mg  PO BID.  Continue to monitor renal  function, cultures, clinical course.   Lynann Beaverhristine Raymundo Rout PharmD, BCPS Pager [phone] 11/06/2014 10:11 AM

## 2014-11-06 NOTE — Care Management Note (Signed)
    Page 1 of 1   11/06/2014     10:45:41 AM CARE MANAGEMENT NOTE 11/06/2014  Patient:  Corey Wilcox,Corey Wilcox   Account Number:  [id number]  Date Initiated:  11/06/2014  Documentation initiated by:  Lanier ClamMAHABIR,Jamyrah Saur  Subjective/Objective Assessment:   39 y/o m admitted w/Sepsis.     Action/Plan:   From home. Has pcp,pharmacy.   Anticipated DC Date:  11/06/2014   Anticipated DC Plan:  HOME/SELF CARE      DC Planning Services  CM consult      Choice offered to / List presented to:             Status of service:  Completed, signed off Medicare Important Message given?   (If response is "NO", the following Medicare IM given date fields will be blank) Date Medicare IM given:   Medicare IM given by:   Date Additional Medicare IM given:   Additional Medicare IM given by:    Discharge Disposition:  HOME/SELF CARE  Per UR Regulation:  Reviewed for med. necessity/level of care/duration of stay  If discussed at Long Length of Stay Meetings, dates discussed:    Comments:  11/06/14 Lanier ClamKathy Dinah Lupa RN BSN NCM [phone] d/c home no needs or orders.

## 2014-11-06 NOTE — Discharge Instructions (Signed)
Corey Wilcox was admitted to the Hospital on 11/03/2014 and Discharged on Discharge Date 11/06/2014 and should be excused from work/school   for 6 days starting 11/03/2014 , may return to work/school without any restrictions.  Call Lambert KetoAbraham Feliz MD, [facility] [phone] with questions.  Marinda ElkFELIZ ORTIZ, Shaquita Fort M.D on 11/06/2014,at 10:19 AM  [facility] Office  [phone]    Keep the legs moisturized. By over-the-counter compression stockings, with him every day starting in the morning. Take them off at night.

## 2014-11-06 NOTE — Progress Notes (Signed)
Patient discharged home. Discharge instructions were reviewed with patient. All questions were answered. Prescription given to patient.

## 2014-11-06 NOTE — Discharge Summary (Signed)
Physician Discharge Summary  Corey Wilcox ZOX:[id number] DOB: 07-30-1976 DOA: 11/03/2014  PCP: Rudi Heap, MD  Admit date: 11/03/2014 Discharge date: 11/06/2014  Time spent: 30 minutes  Recommendations for Outpatient Follow-up:  1. Follow-up with allergist in 3 weeks for desensitization of penicillins. 2. We'll need to continue doxycycline for 10 days dense we'll switch him to low-dose clindamycin daily.  Discharge Diagnoses:  Principal Problem:   Sepsis due to cellulitis Active Problems:   Obesity   Essential hypertension, benign   Cellulitis of left lower extremity   Blood poisoning   Septic shock   Discharge Condition: Stable  Diet recommendation: Regular  Filed Weights   11/04/14 0615 11/05/14 0500 11/06/14 0446  Weight: 187.336 kg (413 lb) 186.428 kg (411 lb) 185.657 kg (409 lb 4.8 oz)    History of present illness:  39 y.o. male with Past medical history of hypertension, obesity, fatty liver. Comes in on the day of admission because of more than 24 hours of chills current at night which has progressively gotten worse. He is also some notice some tenderness in his leg with tightness. Symptoms not improved and he decided to come to the hospital.  Hospital Course:  Severe sepsis due to left lower extremity cellulitis: - This is a second episode of septic shock due to cellulitis. - On admission he was started empirically on vancomycin ID was consulted. - After 2 days of antibiotics his erythema and sepsis resolved. - ID recommended to switch to doxycycline orally and continue oral antibiotic treatment for 10 additional days. - Follow-up with allergist as an outpatient.  Obesity: Counseling.  Essential Hypertension: - No changes were made continue home regimen.  Consultants:  PCCM  ID  Procedures:  None   Antibiotics Vancomycin 2/28 >>3.2.2016 Levofloxacin 2/28 >> 2/29 Doxycycline 3.2.2016>> 3.12.2016  Discharge Exam: Filed Vitals:   11/06/14  0446  BP: 142/75  Pulse: 73  Temp: 97.6 F (36.4 C)  Resp: 16    General: Awake alert and oriented 3 Cardiovascular: Regular rate and rhythm Respiratory: Good air movement clear to auscultation.  Discharge Instructions   Discharge Instructions    Diet - low sodium heart healthy    Complete by:  As directed      Increase activity slowly    Complete by:  As directed           Current Discharge Medication List    START taking these medications   Details  clindamycin (CLEOCIN) 150 MG capsule Take 1 capsule (150 mg total) by mouth daily. Qty: 30 capsule, Refills: 2    doxycycline (VIBRA-TABS) 100 MG tablet Take 1 tablet (100 mg total) by mouth every 12 (twelve) hours. Qty: 20 tablet, Refills: 0      CONTINUE these medications which have NOT CHANGED   Details  aspirin 325 MG tablet Take 325 mg by mouth once.    cetirizine (ZYRTEC) 10 MG tablet Take 10 mg by mouth daily.      cholecalciferol (VITAMIN D) 1000 UNITS tablet Take 1,000 Units by mouth daily.    olmesartan-hydrochlorothiazide (BENICAR HCT) 40-25 MG per tablet TAKE 1 TABLET BY MOUTH DAILY. Qty: 90 tablet, Refills: 0    simvastatin (ZOCOR) 10 MG tablet TAKE 1 TABLET (10 MG TOTAL) BY MOUTH AT BEDTIME. Qty: 90 tablet, Refills: 0       Allergies  Allergen Reactions  . Penicillins Swelling and Rash    Throat swelling, with rash, n/v as a child/adolescent  . Cephalexin Nausea And Vomiting  The results of significant diagnostics from this hospitalization (including imaging, microbiology, ancillary and laboratory) are listed below for reference.    Significant Diagnostic Studies: Dg Chest Port 1 View  11/03/2014   CLINICAL DATA:  Acute respiratory failure  EXAM: PORTABLE CHEST - 1 VIEW  COMPARISON:  Radiograph 10/14/2014  FINDINGS: Normal cardiac silhouette. There is mild central venous pulmonary congestion. No effusion, infiltrate, or pneumothorax. No acute osseous abnormality.  IMPRESSION: Central  venous congestion.   Electronically Signed   By: Genevive Bi M.D.   On: 11/03/2014 10:37   Dg Chest Port 1 View  11/03/2014   CLINICAL DATA:  Shortness of breath, tachycardia, fever.  EXAM: PORTABLE CHEST - 1 VIEW  COMPARISON:  07/25/2014  FINDINGS: Heart is at the upper limits of normal. Pulmonary vasculature is normal for technique. No consolidation, pleural effusion or pneumothorax. No acute osseous abnormalities are seen.  IMPRESSION: No acute pulmonary process.   Electronically Signed   By: Rubye Oaks M.D.   On: 11/03/2014 05:25    Microbiology: Recent Results (from the past 240 hour(s))  Culture, blood (routine x 2)     Status: None (Preliminary result)   Collection Time: 11/03/14  2:45 AM  Result Value Ref Range Status   Specimen Description BLOOD RIGHT HAND  Final   Special Requests BOTTLES DRAWN AEROBIC AND ANAEROBIC  Final   Culture   Final           BLOOD CULTURE RECEIVED NO GROWTH TO DATE CULTURE WILL BE HELD FOR 5 DAYS BEFORE ISSUING A FINAL NEGATIVE REPORT Performed at [facility]    Report Status PENDING  Incomplete  Culture, blood (routine x 2)     Status: None (Preliminary result)   Collection Time: 11/03/14  2:45 AM  Result Value Ref Range Status   Specimen Description BLOOD RIGHT UPPER ARM  Final   Special Requests BOTTLES DRAWN AEROBIC AND ANAEROBIC  Final   Culture   Final           BLOOD CULTURE RECEIVED NO GROWTH TO DATE CULTURE WILL BE HELD FOR 5 DAYS BEFORE ISSUING A FINAL NEGATIVE REPORT Performed at [facility]    Report Status PENDING  Incomplete  MRSA PCR Screening     Status: None   Collection Time: 11/03/14  5:47 AM  Result Value Ref Range Status   MRSA by PCR NEGATIVE NEGATIVE Final    Comment:        The GeneXpert MRSA Assay (FDA approved for NASAL specimens only), is one component of a comprehensive MRSA colonization surveillance program. It is not intended to diagnose MRSA infection nor to guide or monitor  treatment for MRSA infections.   Clostridium Difficile by PCR     Status: None   Collection Time: 11/03/14  1:56 PM  Result Value Ref Range Status   C difficile by pcr NEGATIVE NEGATIVE Final    Comment: Performed at [facility]     Labs: Basic Metabolic Panel:  Recent Labs Lab 11/03/14 0415 11/03/14 0715 11/04/14 0730 11/05/14 0700  NA 141 142 139 136  K 4.2 3.4* 4.5 3.3*  CL 101 110 107 103  CO2  --  GLUCOSE 98 88 97 103*  BUN 27* CREATININE 1.00 1.12 0.92 0.84  CALCIUM  --  7.8* 7.7* 7.7*   Liver Function Tests:  Recent Labs Lab 11/03/14 0715 11/04/14 0730  AST 30 43*  ALT 40  40  ALKPHOS 36* 57  BILITOT 1.1 1.6*  PROT 5.3* 5.3*  ALBUMIN 3.1* 2.8*   No results for input(s): LIPASE, AMYLASE in the last 168 hours. No results for input(s): AMMONIA in the last 168 hours. CBC:  Recent Labs Lab 11/03/14 0245 11/03/14 0415 11/04/14 0730 11/05/14 0700  WBC 16.6*  --  24.3* 13.2*  NEUTROABS 14.9*  --   --   --   HGB 15.3 16.7 13.2 13.4  HCT 45.8 49.0 38.8* 39.4  MCV 94.0  --  93.7 93.1  PLT 323  --  212 239   Cardiac Enzymes: No results for input(s): CKTOTAL, CKMB, CKMBINDEX, TROPONINI in the last 168 hours. BNP: BNP (last 3 results) No results for input(s): BNP in the last 8760 hours.  ProBNP (last 3 results) No results for input(s): PROBNP in the last 8760 hours.  CBG: No results for input(s): GLUCAP in the last 168 hours.     Signed:  Marinda ElkFELIZ ORTIZ, Telitha Plath  [facility] 11/06/2014, 10:29 AM

## 2014-11-09 LAB — PTT-LA INCUB MIX: PTT-LA Incub Mix: 55.6 s — ABNORMAL HIGH (ref 0.0–50.0)

## 2014-11-09 LAB — CULTURE, BLOOD (ROUTINE X 2)
CULTURE: NO GROWTH
Culture: NO GROWTH

## 2014-11-09 LAB — ANTIPHOSPHOLIPID SYNDROME EVAL, BLD
Anticardiolipin IgA: <9 APL U/mL (ref 0–11)
Anticardiolipin IgM: <9 MPL U/mL (ref 0–12)
DRVVT: 41.8 s (ref 0.0–55.1)
PHOSPHATYDALSERINE, IGM: 7 {MPS'U} (ref 0–25)
PTT Lupus Anticoagulant: 54.8 s — ABNORMAL HIGH (ref 0.0–50.0)
Phosphatydalserine, IgA: 3 APS IgA (ref 0–20)
Phosphatydalserine, IgG: 1 GPS IgG (ref 0–11)

## 2014-11-09 LAB — PTT-LA MIX: PTT-LA Mix: 46.1 s (ref 0.0–50.0)

## 2014-11-09 LAB — HEXAGONAL PHASE PHOSPHOLIPID: HEXAGONAL PHASE PHOSPHOLIPID: 25.4 s — AB (ref 0.0–8.0)

## 2014-11-12 ENCOUNTER — Ambulatory Visit (INDEPENDENT_AMBULATORY_CARE_PROVIDER_SITE_OTHER): Payer: 59 | Admitting: Family Medicine

## 2014-11-12 ENCOUNTER — Encounter: Payer: Self-pay | Admitting: Family Medicine

## 2014-11-12 VITALS — BP 125/76 | HR 113 | Temp 97.0°F | Ht 67.0 in | Wt 390.0 lb

## 2014-11-12 DIAGNOSIS — Z09 Encounter for follow-up examination after completed treatment for conditions other than malignant neoplasm: Secondary | ICD-10-CM | POA: Diagnosis not present

## 2014-11-12 DIAGNOSIS — L03116 Cellulitis of left lower limb: Secondary | ICD-10-CM

## 2014-11-12 NOTE — Patient Instructions (Addendum)
Continue to wear support hose Watch sodium intake Drink plenty of water Keep follow-up appointment with allergist Call the discharge Dr. and confirm with him whether you should continue to take the clindamycin after you finish a course of this The patient should start a probiotic and take this daily He should watch his caffeine intake and milk cheese ice cream and dairy products

## 2014-11-12 NOTE — Progress Notes (Signed)
Subjective:    Patient ID: Corey Wilcox, male    DOB: 05/13/1976, 39 y.o.   MRN: [id number]  HPI Patient [facility] He was admitted there for cellulitis of left lower extremity. He was sent home on Doxycycline and will start clindamycin once that has been completed. He has a follow-up appointment with an allergist. The patient comes to the visit today with his mother. They have a lot of questions regarding why this is happening. He is wearing support hose now. This is the second episode of sepsis that he has had and they're still thinking it is coming from his leg. He does not describe any edema prior to this happening but awakes at nighttime with chills and fever. He is doing better.           Patient Active Problem List   Diagnosis Date Noted  . Septic shock 11/04/2014  . Cellulitis of left lower extremity   . Blood poisoning   . Sepsis due to cellulitis 11/03/2014  . Hydronephrosis, left 07/29/2014  . Sepsis 07/25/2014  . Hyperlipidemia 07/25/2014  . Severe sepsis 07/25/2014  . Cellulitis of leg, left 07/25/2014  . Abnormal urinalysis 07/25/2014  . Palpitations   . Hyperplasia of prostate   . Other and unspecified hyperlipidemia   . Obesity   . Fatty liver   . Essential hypertension, benign    Outpatient Encounter Prescriptions as of 11/12/2014  Medication Sig  . cetirizine (ZYRTEC) 10 MG tablet Take 10 mg by mouth daily.    . cholecalciferol (VITAMIN D) 1000 UNITS tablet Take 1,000 Units by mouth daily.  Marland Kitchen. doxycycline (VIBRA-TABS) 100 MG tablet Take 1 tablet (100 mg total) by mouth every 12 (twelve) hours.  Marland Kitchen. olmesartan-hydrochlorothiazide (BENICAR HCT) 40-25 MG per tablet TAKE 1 TABLET BY MOUTH DAILY. (Patient taking differently: Take 0.5 tablets by mouth daily. TAKE 1 TABLET BY MOUTH DAILY.)  . simvastatin (ZOCOR) 10 MG tablet TAKE 1 TABLET (10 MG TOTAL) BY MOUTH AT BEDTIME.  Melene Muller. [START ON 11/17/2014] clindamycin (CLEOCIN) 150 MG  capsule Take 1 capsule (150 mg total) by mouth daily. (Patient not taking: Reported on 11/12/2014)  . [DISCONTINUED] aspirin 325 MG tablet Take 325 mg by mouth once.    Review of Systems  Constitutional: Negative.   HENT: Negative.   Eyes: Negative.   Respiratory: Negative.   Cardiovascular: Negative.   Gastrointestinal: Negative.   Endocrine: Negative.   Genitourinary: Negative.   Musculoskeletal: Negative.   Skin: Negative.        Cellulitis of left lower extremity.   Allergic/Immunologic: Negative.   Neurological: Negative.   Hematological: Negative.   Psychiatric/Behavioral: Negative.        Objective:   Physical Exam  Constitutional: He is oriented to person, place, and time. He appears well-developed and well-nourished. No distress.  HENT:  Head: Normocephalic.  Eyes: Conjunctivae and EOM are normal. Pupils are equal, round, and reactive to light. Right eye exhibits no discharge. Left eye exhibits no discharge. No scleral icterus.  Neck: Normal range of motion.  Cardiovascular: Normal rate, regular rhythm and normal heart sounds.   No murmur heard. Pulmonary/Chest: Effort normal and breath sounds normal. He has no wheezes. He has no rales.  Abdominal: Soft. Bowel sounds are normal. He exhibits no distension and no mass. There is no tenderness. There is no rebound and no guarding.  Morbid obesity  Musculoskeletal: Normal range of motion. He exhibits no edema.  Neurological: He is  alert and oriented to person, place, and time.  Skin: Skin is warm and dry. No rash noted. There is erythema. No pallor.  Minimal erythema left lower leg. No edema  Psychiatric: He has a normal mood and affect. His behavior is normal. Thought content normal.  Nursing note and vitals reviewed.  BP 125/76 mmHg  Pulse 113  Temp(Src) 97 F (36.1 C) (Oral)  Ht  (1.702 m)  Wt 390 lb (176.903 kg)  BMI 61.07 kg/m2        Assessment & Plan:  1. Hospital discharge follow-up -The patient  is improved and he is wearing his below the knee support hose and currently still taking doxycycline  2. Cellulitis of left lower extremity -This has improved and there is minimal redness of the left lower extremity  3. Morbid obesity -The patient knows that he needs to continue to work on his weight and follow a strict diet habits  Patient Instructions  Continue to wear support hose Watch sodium intake Drink plenty of water Keep follow-up appointment with allergist Call the discharge Dr. and confirm with him whether you should continue to take the clindamycin after you finish a course of this The patient should start a probiotic and take this daily He should watch his caffeine intake and milk cheese ice cream and dairy products   Nyra Capes MD

## 2014-11-14 DIAGNOSIS — Z0289 Encounter for other administrative examinations: Secondary | ICD-10-CM

## 2014-11-25 ENCOUNTER — Telehealth: Payer: Self-pay | Admitting: Family Medicine

## 2014-11-25 NOTE — Telephone Encounter (Signed)
Forms filled out and faxed today

## 2014-12-08 ENCOUNTER — Other Ambulatory Visit: Payer: Self-pay | Admitting: Family Medicine

## 2014-12-10 ENCOUNTER — Telehealth: Payer: Self-pay | Admitting: Family Medicine

## 2014-12-11 NOTE — Telephone Encounter (Signed)
Lm -jhb- 4/6-16

## 2014-12-20 ENCOUNTER — Encounter: Payer: Self-pay | Admitting: *Deleted

## 2014-12-30 ENCOUNTER — Encounter: Payer: Self-pay | Admitting: Infectious Diseases

## 2014-12-30 ENCOUNTER — Ambulatory Visit (INDEPENDENT_AMBULATORY_CARE_PROVIDER_SITE_OTHER): Payer: 59 | Admitting: Infectious Diseases

## 2014-12-30 VITALS — BP 141/82 | HR 105 | Temp 98.2°F | Ht 69.0 in | Wt 385.0 lb

## 2014-12-30 DIAGNOSIS — L03116 Cellulitis of left lower limb: Secondary | ICD-10-CM | POA: Diagnosis not present

## 2014-12-30 NOTE — Progress Notes (Signed)
   Subjective:    Patient ID: Corey Wilcox, male    DOB: 09/03/1976, 39 y.o.   MRN: [id number]  HPI [name] with hx of morbid obesity, fatty liver, who was admitted in Nov 2015 with sepsis and cellulitis.  Her returned 2-28 to 3-2 with the same. He was treated with vanco/levaquin due to a severe PEN allergy. He was tapered to vanco alone then to doxy. He was given this for 10 days then changed to low dose Clinda. His doppler was (-).  He was seen by allergy and had PEN challenge. He states he only felt quesy during this but otherwise had no rash, n/v, no SOB and no throat swelling.  Still has some "splotchy" of his leg.  Has been wearing compression stockings.  Uses [place] soap.   Review of Systems  Constitutional: Negative for fever and chills.       Objective:   Physical Exam  Constitutional: He appears well-developed and well-nourished.  HENT:  Mouth/Throat: No oropharyngeal exudate.  Eyes: EOM are normal. Pupils are equal, round, and reactive to light.  Neck: Neck supple.  Cardiovascular: Normal rate, regular rhythm and normal heart sounds.   Pulmonary/Chest: Effort normal and breath sounds normal.  Abdominal: Soft. Bowel sounds are normal. He exhibits distension. There is no tenderness.  Musculoskeletal:       Legs: Lymphadenopathy:    He has no cervical adenopathy.       Assessment & Plan:

## 2014-12-30 NOTE — Assessment & Plan Note (Signed)
His leg has improved and he is doing well. I spoke at length to he and his mother regarding pathophysiology of this and how to prevent this in the future.  1) weight loss 2) keep leg elevated 3) prophylactic anbx 4) compression stockings 5) antibacterial soap.    He will switch to PEN VK when he finishes his most recent refill of Clinda.  He will stay on this for the next 6 months until he has f/u in ID.  I have asked him to call at any point if he has any problems, I have asked him to call immediately.

## 2015-01-15 ENCOUNTER — Telehealth: Payer: Self-pay | Admitting: *Deleted

## 2015-01-15 NOTE — Telephone Encounter (Signed)
Handwritten rx for Pen VK 400mg , tablets come 250mg  or 500mg .  Pharmacy requesting clarification.  RN spoke with Dr. Ninetta LightsHatcher.  Received order for Pen VK once daily.  Notified pharmacy of order change.  Verbalized back.

## 2015-02-10 ENCOUNTER — Ambulatory Visit: Payer: 59 | Admitting: Family Medicine

## 2015-02-21 ENCOUNTER — Ambulatory Visit: Payer: 59 | Admitting: Family Medicine

## 2015-03-04 ENCOUNTER — Other Ambulatory Visit: Payer: Self-pay | Admitting: Family Medicine

## 2015-03-04 NOTE — Telephone Encounter (Signed)
Last seen 11/12/14 DWM  Last lipid 01/04/14  Requesting 90 day supply

## 2015-03-04 NOTE — Telephone Encounter (Signed)
You can give the patient a one month supply and he needs to come in and get a lipid profile and liver function tests

## 2015-03-05 ENCOUNTER — Encounter: Payer: Self-pay | Admitting: Family Medicine

## 2015-03-05 ENCOUNTER — Ambulatory Visit (INDEPENDENT_AMBULATORY_CARE_PROVIDER_SITE_OTHER): Payer: 59 | Admitting: Family Medicine

## 2015-03-05 VITALS — BP 116/80 | HR 105 | Temp 97.0°F | Ht 69.0 in | Wt 398.0 lb

## 2015-03-05 DIAGNOSIS — K760 Fatty (change of) liver, not elsewhere classified: Secondary | ICD-10-CM | POA: Diagnosis not present

## 2015-03-05 DIAGNOSIS — E785 Hyperlipidemia, unspecified: Secondary | ICD-10-CM | POA: Diagnosis not present

## 2015-03-05 DIAGNOSIS — E559 Vitamin D deficiency, unspecified: Secondary | ICD-10-CM | POA: Diagnosis not present

## 2015-03-05 DIAGNOSIS — I10 Essential (primary) hypertension: Secondary | ICD-10-CM

## 2015-03-05 LAB — POCT CBC
Granulocyte percent: 60.8 %G (ref 37–80)
HEMATOCRIT: 49.6 % (ref 43.5–53.7)
Hemoglobin: 15.3 g/dL (ref 14.1–18.1)
Lymph, poc: 1.9 (ref 0.6–3.4)
MCH: 29 pg (ref 27–31.2)
MCHC: 30.9 g/dL — AB (ref 31.8–35.4)
MCV: 93.6 fL (ref 80–97)
MPV: 8.3 fL (ref 0–99.8)
POC GRANULOCYTE: 3.6 (ref 2–6.9)
POC LYMPH %: 31.7 % (ref 10–50)
Platelet Count, POC: 292 10*3/uL (ref 142–424)
RBC: 5.3 M/uL (ref 4.69–6.13)
RDW, POC: 14.6 %
WBC: 6 10*3/uL (ref 4.6–10.2)

## 2015-03-05 NOTE — Patient Instructions (Addendum)
Continue current medications. Continue good therapeutic lifestyle changes which include good diet and exercise. Fall precautions discussed with patient. If an FOBT was given today- please return it to our front desk. If you are over 39 years old - you may need Prevnar 13 or the adult Pneumonia vaccine.  Flu Shots are still available at our office. If you still haven't had one please call to set up a nurse visit to get one.   After your visit with us today you will receive a survey in the mail or online from [employer] regarding your care with us. Please take a moment to fill this out. Your feedback is very important to us as you can help us better understand your patient needs as well as improve your experience and satisfaction. WE CARE ABOUT YOU!!!   The patient is encouraged again to pursue an aggressive weight loss regimen and weight watchers was recommended  He should take ibuprofen over-the-counter 1 twice daily after breakfast and supper for 7-10 days unless it bothers his stomach or causes fluid retention. This may help some of the arthralgias in the shoulder the hip and the knee. He should follow-up with infectious disease as planned and continue the penicillin as recommended by them He should stay as active as physically possible with walking exercises. He can and should fall and aggressive diet regimen and sodium restriction

## 2015-03-05 NOTE — Progress Notes (Signed)
Subjective:    Patient ID: Corey Wilcox, male    DOB: August 26, 1976, 39 y.o.   MRN: [id number]  HPI Pt here for follow up and management of chronic medical problems which includes hypertension and hyperlipidemia. He is taking medications regularly. He comes to the visit today with his mother. The patient has a history of morbid obesity and fatty liver. It is important to note that he has had several bouts of cellulitis in his left leg with sepsis requiring hospitalization. He is being followed by infectious disease. The last visit was sometime in April of this year. According to the note he was to be taking Pen-Vee K for 6 months until his next visit. He is to be using antibacterial soap also. His complaints today include a slightly productive cough and some arthralgias in his left knee shoulder blade and right hip . the cough is been going on for a couple weeks.        Patient Active Problem List   Diagnosis Date Noted  . Septic shock 11/04/2014  . Cellulitis of left lower extremity   . Blood poisoning   . Sepsis due to cellulitis 11/03/2014  . Hydronephrosis, left 07/29/2014  . Sepsis 07/25/2014  . Hyperlipidemia 07/25/2014  . Severe sepsis 07/25/2014  . Cellulitis of leg, left 07/25/2014  . Abnormal urinalysis 07/25/2014  . Palpitations   . Hyperplasia of prostate   . Other and unspecified hyperlipidemia   . Obesity   . Fatty liver   . Essential hypertension, benign    Outpatient Encounter Prescriptions as of 03/05/2015  Medication Sig  . BENICAR HCT 40-25 MG per tablet TAKE 1 TABLET BY MOUTH DAILY.  . cetirizine (ZYRTEC) 10 MG tablet Take 10 mg by mouth daily.    . cholecalciferol (VITAMIN D) 1000 UNITS tablet Take 1,000 Units by mouth daily.  . penicillin v potassium (VEETID) 500 MG tablet Take 500 mg by mouth daily.  . simvastatin (ZOCOR) 10 MG tablet TAKE 1 TABLET (10 MG TOTAL) BY MOUTH AT BEDTIME.  . [DISCONTINUED] clindamycin (CLEOCIN) 150 MG capsule Take 1 capsule  (150 mg total) by mouth daily.   No facility-administered encounter medications on file as of 03/05/2015.      Review of Systems  Constitutional: Negative.   HENT: Negative.   Eyes: Negative.   Respiratory: Positive for cough (somewhat productive).   Cardiovascular: Negative.   Gastrointestinal: Negative.   Endocrine: Negative.   Genitourinary: Negative.   Musculoskeletal: Positive for arthralgias (left knee pain, right shoulder blade and right hip).  Skin: Negative.   Allergic/Immunologic: Negative.   Neurological: Negative.   Hematological: Negative.   Psychiatric/Behavioral: Negative.        Objective:   Physical Exam  Constitutional: He is oriented to person, place, and time. He appears well-developed and well-nourished. No distress.  Kind alert and morbidly obese young man  HENT:  Head: Normocephalic and atraumatic.  Right Ear: External ear normal.  Left Ear: External ear normal.  Nose: Nose normal.  Mouth/Throat: Oropharynx is clear and moist. No oropharyngeal exudate.  Eyes: Conjunctivae and EOM are normal. Pupils are equal, round, and reactive to light. Right eye exhibits no discharge. Left eye exhibits no discharge. No scleral icterus.  Neck: Normal range of motion. Neck supple. No thyromegaly present.  Without bruits or thyromegaly  Cardiovascular: Normal rate, regular rhythm and normal heart sounds.   No murmur heard. At 72/m  Pulmonary/Chest: Effort normal and breath sounds normal. No respiratory distress. He has no wheezes.  He has no rales. He exhibits no tenderness.  Axilla negative for adenopathy  Abdominal: Soft. Bowel sounds are normal. He exhibits no mass. There is no tenderness. There is no rebound and no guarding.  Morbidly obese with no masses palpable or tenderness.  Musculoskeletal: Normal range of motion. He exhibits no edema or tenderness.  There is tenderness to the left lateral knee and right lateral hip as well as the right upper medial scapular  area. The patient is wearing his support hose as recommended by infectious disease  Lymphadenopathy:    He has no cervical adenopathy.  Neurological: He is alert and oriented to person, place, and time.  Skin: Skin is warm and dry. No rash noted. No erythema. No pallor.  Psychiatric: He has a normal mood and affect. His behavior is normal. Judgment and thought content normal.  Nursing note and vitals reviewed.  BP 116/80 mmHg  Pulse 105  Temp(Src) 97 F (36.1 C) (Oral)  Ht 5' 9" (1.753 m)  Wt 398 lb (180.532 kg)  BMI 58.75 kg/m2        Assessment & Plan:  1. Hyperlipidemia -The patient should continue with his simvastatin and with aggressive therapeutic lifestyle changes which include diet and exercise - POCT CBC - Lipid panel  2. Vitamin D deficiency -Continue with current vitamin D dose pending results of lab work - POCT CBC - Vit D  25 hydroxy (rtn osteoporosis monitoring)  3. Essential hypertension -Blood pressure is good today he should continue with his Benicar - POCT CBC - BMP8+EGFR - Hepatic function panel  4. Fatty liver -Aggressive diet regimen discussed  5. Morbid obesity -Weight watchers was recommended and a return visit with our clinical pharmacist who is a certified diabetic educator was recommended to help him with continued weight loss  Patient Instructions  Continue current medications. Continue good therapeutic lifestyle changes which include good diet and exercise. Fall precautions discussed with patient. If an FOBT was given today- please return it to our front desk. If you are over 28 years old - you may need Prevnar 53 or the adult Pneumonia vaccine.  Flu Shots are still available at our office. If you still haven't had one please call to set up a nurse visit to get one.   After your visit with [place] today you will receive a survey in the mail or online from [employer] regarding your care with [place]. Please take a moment to fill this out. Your  feedback is very important to [place] as you can help [place] better understand your patient needs as well as improve your experience and satisfaction. WE CARE ABOUT YOU!!!   The patient is encouraged again to pursue an aggressive weight loss regimen and weight watchers was recommended  He should take ibuprofen over-the-counter 1 twice daily after breakfast and supper for 7-10 days unless it bothers his stomach or causes fluid retention. This may help some of the arthralgias in the shoulder the hip and the knee. He should follow-up with infectious disease as planned and continue the penicillin as recommended by them He should stay as active as physically possible with walking exercises. He can and should fall and aggressive diet regimen and sodium restriction   Arrie Senate MD

## 2015-03-06 LAB — BMP8+EGFR
BUN / CREAT RATIO: 20 — AB (ref 8–19)
BUN: 16 mg/dL (ref 6–20)
CALCIUM: 9.2 mg/dL (ref 8.7–10.2)
CO2: 26 mmol/L (ref 18–29)
Chloride: 98 mmol/L (ref 97–108)
Creatinine, Ser: 0.8 mg/dL (ref 0.76–1.27)
GFR calc non Af Amer: 113 mL/min/{1.73_m2} (ref >59)
GFR, EST AFRICAN AMERICAN: 131 mL/min/{1.73_m2} (ref >59)
GLUCOSE: 93 mg/dL (ref 65–99)
Potassium: 4.5 mmol/L (ref 3.5–5.2)
SODIUM: 141 mmol/L (ref 134–144)

## 2015-03-06 LAB — HEPATIC FUNCTION PANEL
ALBUMIN: 3.8 g/dL (ref 3.5–5.5)
ALT: 66 IU/L — ABNORMAL HIGH (ref 0–44)
AST: 37 IU/L (ref 0–40)
Alkaline Phosphatase: 51 IU/L (ref 39–117)
Bilirubin Total: 0.5 mg/dL (ref 0.0–1.2)
Bilirubin, Direct: 0.14 mg/dL (ref 0.00–0.40)
TOTAL PROTEIN: 6 g/dL (ref 6.0–8.5)

## 2015-03-06 LAB — LIPID PANEL
Chol/HDL Ratio: 3.9 ratio units (ref 0.0–5.0)
Cholesterol, Total: 177 mg/dL (ref 100–199)
HDL: 45 mg/dL (ref >39)
LDL CALC: 92 mg/dL (ref 0–99)
TRIGLYCERIDES: 199 mg/dL — AB (ref 0–149)
VLDL Cholesterol Cal: 40 mg/dL (ref 5–40)

## 2015-03-06 LAB — VITAMIN D 25 HYDROXY (VIT D DEFICIENCY, FRACTURES): VIT D 25 HYDROXY: 29 ng/mL — AB (ref 30.0–100.0)

## 2015-03-24 ENCOUNTER — Encounter: Payer: Self-pay | Admitting: *Deleted

## 2015-04-12 ENCOUNTER — Other Ambulatory Visit: Payer: Self-pay | Admitting: Infectious Diseases

## 2015-04-12 DIAGNOSIS — L03119 Cellulitis of unspecified part of limb: Secondary | ICD-10-CM

## 2015-06-06 ENCOUNTER — Other Ambulatory Visit: Payer: Self-pay | Admitting: Family Medicine

## 2015-06-11 ENCOUNTER — Other Ambulatory Visit: Payer: Self-pay | Admitting: Family Medicine

## 2015-06-30 ENCOUNTER — Ambulatory Visit (INDEPENDENT_AMBULATORY_CARE_PROVIDER_SITE_OTHER): Payer: 59 | Admitting: Infectious Diseases

## 2015-06-30 ENCOUNTER — Encounter: Payer: Self-pay | Admitting: Infectious Diseases

## 2015-06-30 VITALS — BP 134/84 | HR 99 | Temp 97.6°F | Ht 69.5 in | Wt >= 6400 oz

## 2015-06-30 DIAGNOSIS — L0390 Cellulitis, unspecified: Secondary | ICD-10-CM

## 2015-06-30 DIAGNOSIS — A419 Sepsis, unspecified organism: Secondary | ICD-10-CM

## 2015-06-30 DIAGNOSIS — L03119 Cellulitis of unspecified part of limb: Secondary | ICD-10-CM | POA: Diagnosis not present

## 2015-06-30 MED ORDER — PENICILLIN V POTASSIUM 500 MG PO TABS: 500.0000 mg | ORAL_TABLET | Freq: Every day | ORAL | Status: DC

## 2015-06-30 NOTE — Assessment & Plan Note (Addendum)
He is doing well on PEN prophylaxis for his LE cellulitis.  I encouraged him to keep his legs up when he can.  I encouraged him to loose wt.  They are not ready to stop the PEN.  Spent > 25 minutes counseling pt and mom Will see him back in 1 year.

## 2015-06-30 NOTE — Progress Notes (Signed)
   Subjective:    Patient ID: Corey Wilcox, male    DOB: 02/04/1976, 39 y.o.   MRN: [id number]  HPI  [name] with hx of morbid obesity, fatty liver, who was admitted in Nov 2015 with sepsis and cellulitis.  Her returned 2-28 to 11-06-14 with the same. He was treated with vanco/levaquin due to a severe PEN allergy. He was tapered to vanco alone then to doxy. He was given this for 10 days then changed to low dose Clinda. His doppler was (-).  He was seen by allergy and had PEN challenge. Changed to PEN earlier this year.   Uses antibacterial soap.   Has not felt like he has had any recurrences.  No problems with PEN.  Has not had any further recurrences.  Has had episodes when he has been "really tired" where he thought he might be having recurrence. His mom second checks and there is no infection. Has tingling in his legs.  No f/c.  On his feet a lot at work. 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  Has been wearing compression stockings. Except during weekend.  Review of Systems Please see HPI. 12 point ROS o/w (-).     Objective:   Physical Exam  Constitutional: He appears well-developed and well-nourished.  Non-toxic appearance. He does not have a sickly appearance. He does not appear ill. No distress.  Abdominal:  Morbid obesity  Musculoskeletal:       Feet:      Assessment & Plan:

## 2015-07-28 ENCOUNTER — Ambulatory Visit (INDEPENDENT_AMBULATORY_CARE_PROVIDER_SITE_OTHER): Payer: 59 | Admitting: Family Medicine

## 2015-07-28 ENCOUNTER — Encounter: Payer: Self-pay | Admitting: Family Medicine

## 2015-07-28 VITALS — BP 123/80 | HR 107 | Temp 96.1°F | Ht 69.0 in | Wt >= 6400 oz

## 2015-07-28 DIAGNOSIS — E785 Hyperlipidemia, unspecified: Secondary | ICD-10-CM

## 2015-07-28 DIAGNOSIS — E559 Vitamin D deficiency, unspecified: Secondary | ICD-10-CM

## 2015-07-28 DIAGNOSIS — I10 Essential (primary) hypertension: Secondary | ICD-10-CM | POA: Diagnosis not present

## 2015-07-28 DIAGNOSIS — I872 Venous insufficiency (chronic) (peripheral): Secondary | ICD-10-CM

## 2015-07-28 DIAGNOSIS — J069 Acute upper respiratory infection, unspecified: Secondary | ICD-10-CM | POA: Diagnosis not present

## 2015-07-28 DIAGNOSIS — I8310 Varicose veins of unspecified lower extremity with inflammation: Secondary | ICD-10-CM

## 2015-07-28 MED ORDER — AZITHROMYCIN 250 MG PO TABS: ORAL_TABLET | ORAL | Status: DC

## 2015-07-28 NOTE — Progress Notes (Signed)
Subjective:    Patient ID: Corey Wilcox, male    DOB: 07-Dec-1975, 39 y.o.   MRN: [id number]  HPI Pt here for follow up and management of chronic medical problems which includes hyperlipidemia and hypertension. He is taking medications regularly. Addition today he is complaining with congestion and cough and a lot of belching and indigestion. He has an FOBT at home which he has not returned. Because he is feeling bad today he would prefer to wait for his flu shot. He will come back to the office for fasting lab work. His weight remains elevated. He has a BMI of about 61.7. The patient started feeling sick with some upper GI symptoms and belching and this led to having a scratchy throat and cough and congestion and drainage. He denies any chest pain or shortness of breath. He is not having any trouble swallowing his food but he is having the heartburn and indigestion. He is passing his water without problems and his bowel movements are normal in color and consistency although they were loose initially but are more formed now. As of note, the patient continues to see the infectious disease physician and he currently has him on wearing support stockings daily. Says he's been wearing these he has not had any further bouts of stasis dermatitis and infection and sepsis from this.       Patient Active Problem List   Diagnosis Date Noted  . Septic shock ([place]) 11/04/2014  . Sepsis due to cellulitis ([place]) 11/03/2014  . Hydronephrosis, left 07/29/2014  . Hyperlipidemia 07/25/2014  . Cellulitis of leg, left 07/25/2014  . Abnormal urinalysis 07/25/2014  . Palpitations   . Hyperplasia of prostate   . Morbid obesity ([place])   . Fatty liver   . Essential hypertension, benign    Outpatient Encounter Prescriptions as of 07/28/2015  Medication Sig  . BENICAR HCT 40-25 MG tablet TAKE 1 TABLET BY MOUTH DAILY. (Patient taking differently: TAKE 1/2 TABLET BY MOUTH DAILY.)  . cetirizine (ZYRTEC) 10 MG tablet  Take 10 mg by mouth daily.    . cholecalciferol (VITAMIN D) 1000 UNITS tablet Take 2,000 Units by mouth daily.   . penicillin v potassium (VEETID) 500 MG tablet Take 1 tablet (500 mg total) by mouth daily.  . simvastatin (ZOCOR) 10 MG tablet TAKE 1 TABLET (10 MG TOTAL) BY MOUTH AT BEDTIME.   No facility-administered encounter medications on file as of 07/28/2015.      Review of Systems  Constitutional: Negative.   HENT: Positive for congestion.   Eyes: Negative.   Respiratory: Positive for cough.   Cardiovascular: Negative.   Gastrointestinal: Negative.        Belching and indigestion  Endocrine: Negative.   Genitourinary: Negative.   Musculoskeletal: Negative.   Skin: Negative.   Allergic/Immunologic: Negative.   Neurological: Negative.   Hematological: Negative.   Psychiatric/Behavioral: Negative.        Objective:   Physical Exam  Constitutional: He is oriented to person, place, and time. He appears well-developed and well-nourished. No distress.  Pleasant and remains morbidly obese  HENT:  Head: Normocephalic and atraumatic.  Right Ear: External ear normal.  Left Ear: External ear normal.  Mouth/Throat: No oropharyngeal exudate.  The throat is slightly red posteriorly and there is nasal congestion right greater than left.  Eyes: Conjunctivae and EOM are normal. Pupils are equal, round, and reactive to light. Right eye exhibits no discharge. Left eye exhibits no discharge. No scleral icterus.  Neck: Normal range  of motion. Neck supple. No thyromegaly present.  No anterior cervical adenopathy  Cardiovascular: Normal rate, regular rhythm and normal heart sounds.  Exam reveals no gallop and no friction rub.   No murmur heard. Distal pulses were difficult to palpate. At 96/m  Pulmonary/Chest: Effort normal and breath sounds normal. No respiratory distress. He has no wheezes. He has no rales. He exhibits no tenderness.  Dry cough  Abdominal: Soft. Bowel sounds are normal.  He exhibits no mass. There is no tenderness. There is no rebound and no guarding.  Morbidly obese and difficult to palpate any masses or organ enlargement.  Musculoskeletal: Normal range of motion. He exhibits no edema or tenderness.  Lymphadenopathy:    He has no cervical adenopathy.  Neurological: He is alert and oriented to person, place, and time.  Skin: Skin is warm and dry. No rash noted.  Psychiatric: He has a normal mood and affect. His behavior is normal. Judgment and thought content normal.  Nursing note and vitals reviewed.  BP 123/80 mmHg  Pulse 107  Temp(Src) 96.1 F (35.6 C) (Oral)  Ht $R'5\' 9"'Ox$  (1.753 m)  Wt 424 lb (192.325 kg)  BMI 62.59 kg/m2        Assessment & Plan:  1. Hyperlipidemia -Continue with diet and aggressive therapeutic lifestyle changes pending results of lab work that it to be done - CBC with Differential/Platelet; Future - Lipid panel; Future  2. Essential hypertension -Blood pressure is good today and he will continue with current treatment - BMP8+EGFR; Future - CBC with Differential/Platelet; Future - Hepatic function panel; Future  3. Vitamin D deficiency -Continue current treatment pending results of lab work - CBC with Differential/Platelet; Future - VITAMIN D 25 Hydroxy (Vit-D Deficiency, Fractures); Future  4. URI (upper respiratory infection) -Use nasal saline and take Mucinex blue and white in color one twice daily with a large glass of water for cough and congestion - azithromycin (ZITHROMAX) 250 MG tablet; 2 pills the first day then one daily for infection until completed  Dispense: 6 tablet; Refill: 0  5. Morbid obesity due to excess calories ([place]) -Continue to work aggressively on diet and arrange a visit with the clinical pharmacist if he so desires to help him with his diet.  6. Venous stasis dermatitis, unspecified laterality -Continue to follow-up with infectious disease because of recurring bouts of sepsis thought to be  coming from his legs and stasis dermatitis.  med . Meds ordered this encounter  Medications  . azithromycin (ZITHROMAX) 250 MG tablet    Sig: 2 pills the first day then one daily for infection until completed    Dispense:  6 tablet    Refill:  0   Patient Instructions  Continue current medications. Continue good therapeutic lifestyle changes which include good diet and exercise. Fall precautions discussed with patient. If an FOBT was given today- please return it to our front desk. If you are over 26 years old - you may need Prevnar 63 or the adult Pneumonia vaccine.  **Flu shots are available--- please call and schedule a FLU-CLINIC appointment**  After your visit with [place] today you will receive a survey in the mail or online from [employer] regarding your care with [place]. Please take a moment to fill this out. Your feedback is very important to [place] as you can help [place] better understand your patient needs as well as improve your experience and satisfaction. WE CARE ABOUT YOU!!!   The patient needs to return to the office  for fasting lab work He should use nasal saline frequently through the day and take Mucinex, maximum strength, blue and white in color, 1 twice daily for cough and congestion He should take the antibiotic as directed He should take Tylenol for aches pains and fever Because of the heartburn symptoms and reflux symptoms she should take Zantac 150 twice daily before breakfast and supper and this is over-the-counter and he will be reminded of that. He should avoid spicy foods fried foods He should keep working on weight loss efforts   Arrie Senate MD

## 2015-07-28 NOTE — Patient Instructions (Addendum)
Continue current medications. Continue good therapeutic lifestyle changes which include good diet and exercise. Fall precautions discussed with patient. If an FOBT was given today- please return it to our front desk. If you are over 39 years old - you may need Prevnar 13 or the adult Pneumonia vaccine.  **Flu shots are available--- please call and schedule a FLU-CLINIC appointment**  After your visit with us today you will receive a survey in the mail or online from [employer] regarding your care with us. Please take a moment to fill this out. Your feedback is very important to us as you can help us better understand your patient needs as well as improve your experience and satisfaction. WE CARE ABOUT YOU!!!   The patient needs to return to the office for fasting lab work He should use nasal saline frequently through the day and take Mucinex, maximum strength, blue and white in color, 1 twice daily for cough and congestion He should take the antibiotic as directed He should take Tylenol for aches pains and fever Because of the heartburn symptoms and reflux symptoms she should take Zantac 150 twice daily before breakfast and supper and this is over-the-counter and he will be reminded of that. He should avoid spicy foods fried foods He should keep working on weight loss efforts

## 2015-07-28 NOTE — Addendum Note (Signed)
Addended by: Magdalene RiverBULLINS, Jakell Trusty H on: 07/28/2015 09:03 AM   Modules accepted: Orders

## 2015-08-15 ENCOUNTER — Ambulatory Visit: Payer: 59 | Admitting: Pharmacist

## 2015-09-06 ENCOUNTER — Other Ambulatory Visit: Payer: Self-pay | Admitting: Nurse Practitioner

## 2015-09-06 ENCOUNTER — Other Ambulatory Visit: Payer: Self-pay | Admitting: Family Medicine

## 2015-09-08 ENCOUNTER — Telehealth: Payer: Self-pay | Admitting: Pediatrics

## 2015-09-08 NOTE — Telephone Encounter (Signed)
Patient called after hours weekend line. He says he is had two days of nasal congestion and starting now to have a tickle to his throat. Some coughing. No fevers. Normal appetite. On suppressive PCN therapy, not on immunosuppressants. Patient says he wants azithromycin. Discussed with patient this is probably a viral upper respiratory infection and that antibiotics are not likely to help. Discussed symptomatic care. Likely to have symptoms for several more days before starting to feel better. Return precautions given.

## 2015-09-09 ENCOUNTER — Telehealth: Payer: Self-pay | Admitting: Family Medicine

## 2015-09-09 MED ORDER — AZITHROMYCIN 250 MG PO TABS: ORAL_TABLET | ORAL | Status: DC

## 2015-09-09 MED ORDER — BENICAR HCT 40-25 MG PO TABS: 1.0000 | ORAL_TABLET | Freq: Every day | ORAL | Status: DC

## 2015-09-09 NOTE — Telephone Encounter (Signed)
Last office visit November 21,2016.  Please advise.

## 2015-09-09 NOTE — Telephone Encounter (Signed)
Okay to refill Z-Pak 1

## 2015-09-17 ENCOUNTER — Ambulatory Visit: Payer: 59 | Admitting: Cardiology

## 2015-11-04 IMAGING — CT CT ABD-PELV W/O CM
2 of 3 series · 16 of 42 positions shown, 18 images · non-contrast
Comparison: None.

CLINICAL DATA: Nephrolithiasis, right flank pain and dysuria for
several days prior to admission, fever, sepsis

EXAM:
CT ABDOMEN AND PELVIS WITHOUT CONTRAST
TECHNIQUE: Multidetector CT imaging of the abdomen and pelvis was performed
following the standard protocol without IV contrast.

[Series 4: lung windows · axial · 0.87mm/px · z∈[-134,-14]mm · 13 of 28 slices shown, 15 images]
[im 3/28  soft-tissue]
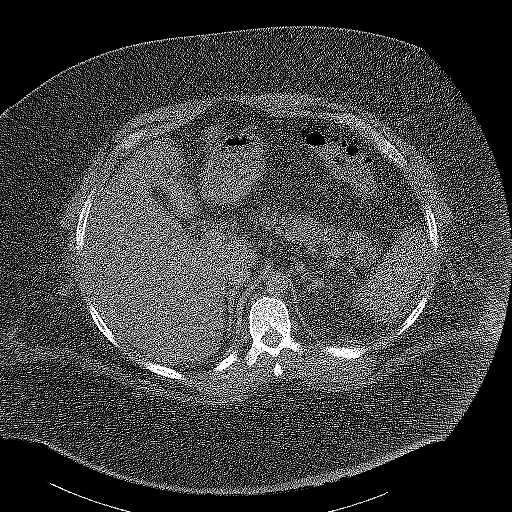
[im 3/28  bone]
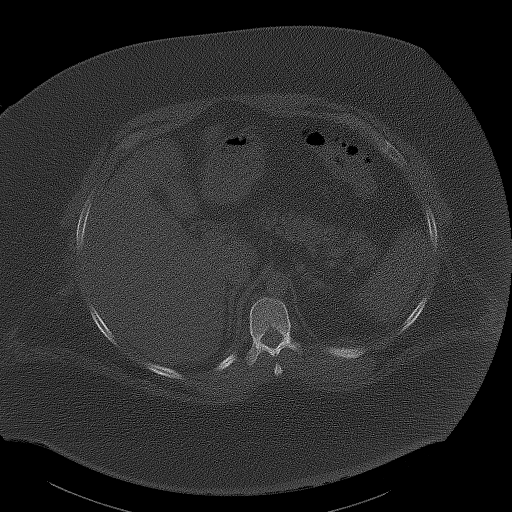
[im 5/28  soft-tissue]
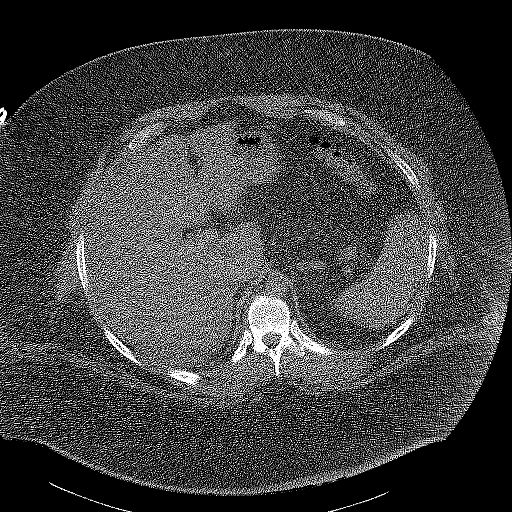
[im 7/28  soft-tissue]
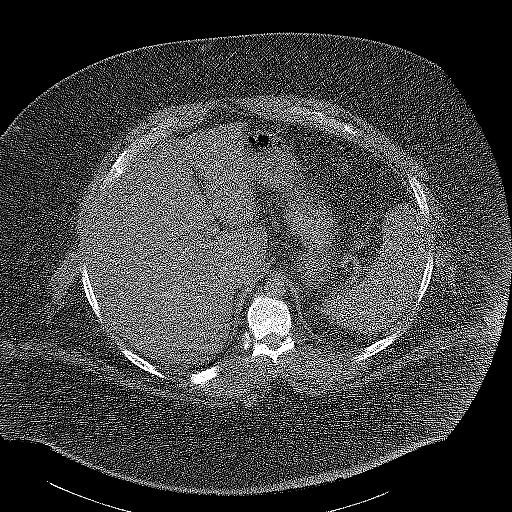
[im 9/28  soft-tissue]
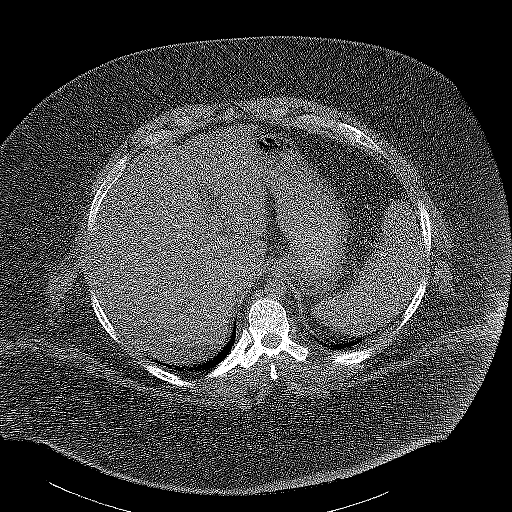
[im 11/28  soft-tissue]
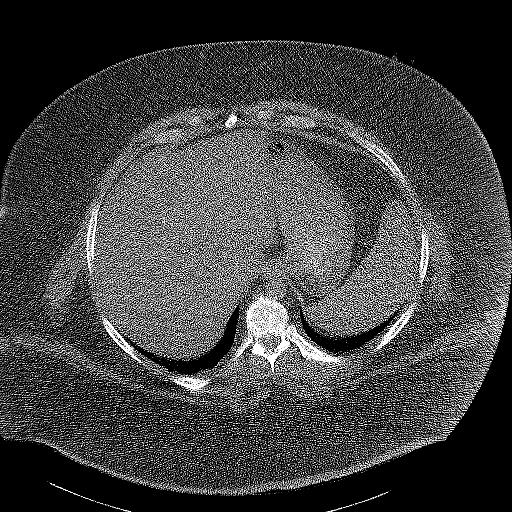
[im 13/28  soft-tissue]
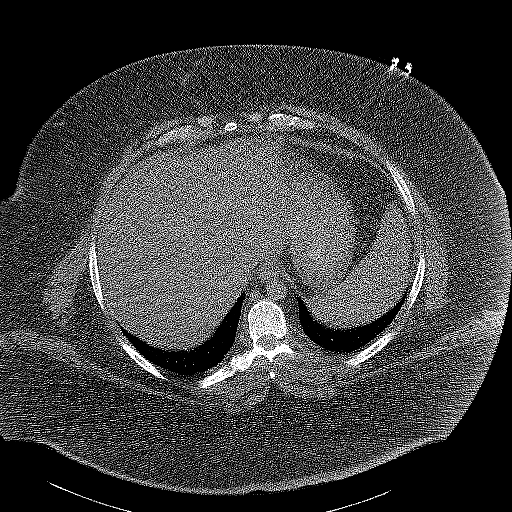
[im 15/28  soft-tissue]
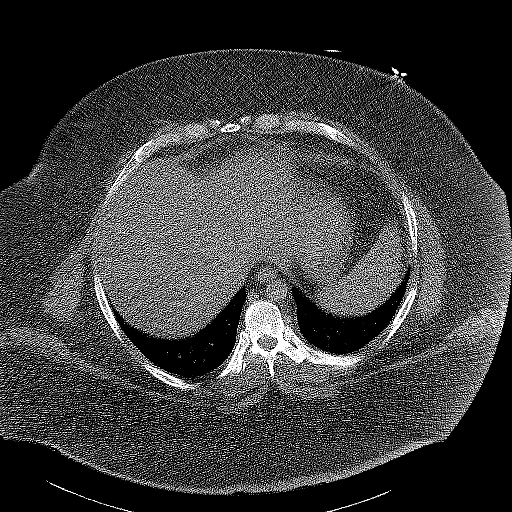
[im 17/28  soft-tissue]
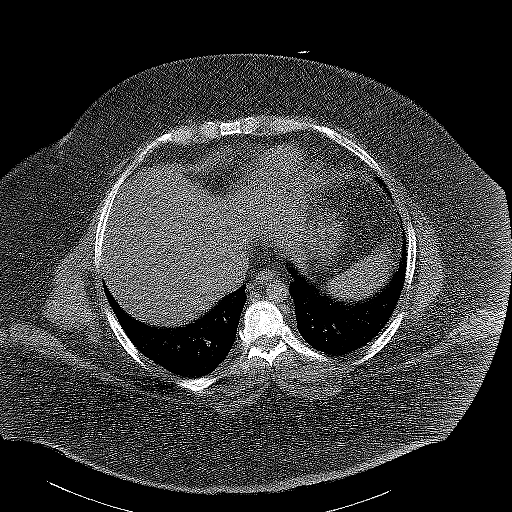
[im 19/28  soft-tissue]
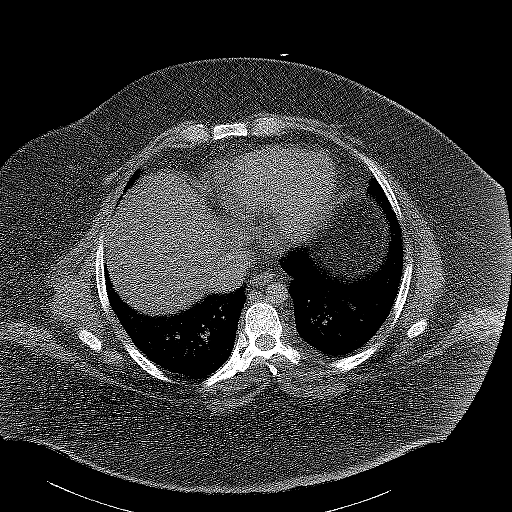
[im 19/28  bone]
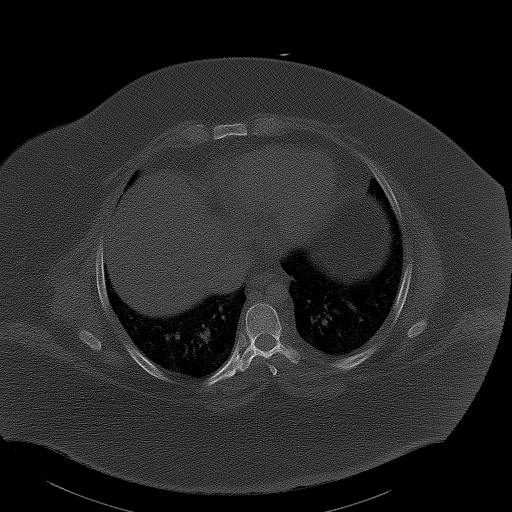
[im 21/28  soft-tissue]
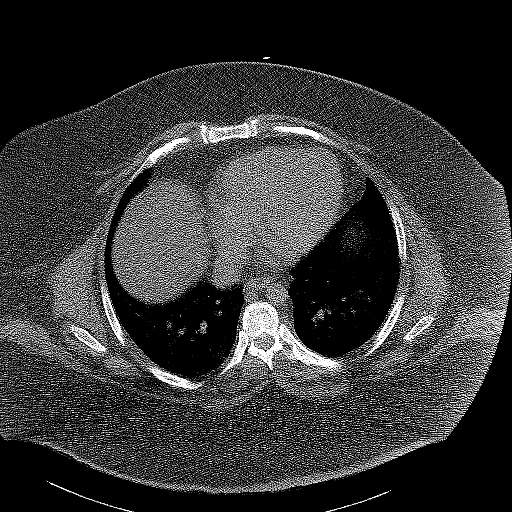
[im 23/28  soft-tissue]
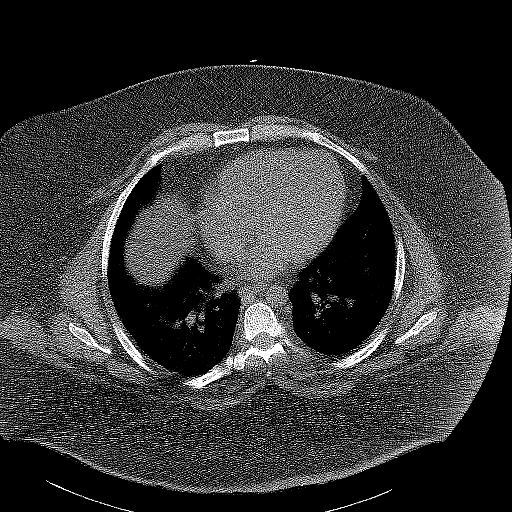
[im 25/28  soft-tissue]
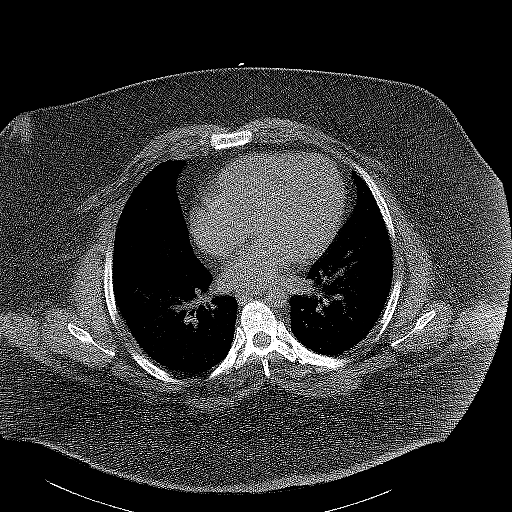
[im 27/28  soft-tissue]
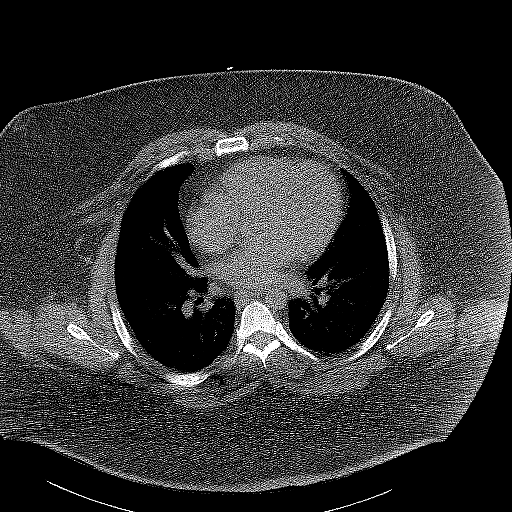

[Series 602: <mpr thick range> · coronal · 1.04mm/px · 3 of 113 slices shown]
[im 38/113  soft-tissue]
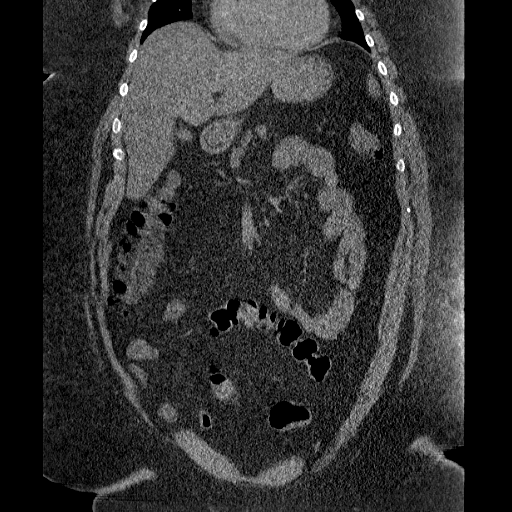
[im 50/113  soft-tissue]
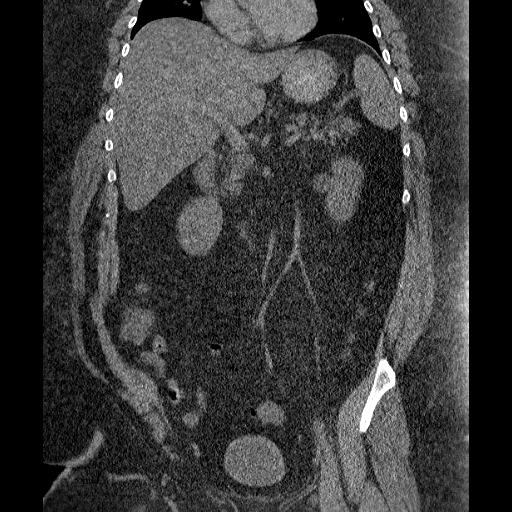
[im 63/113  soft-tissue]
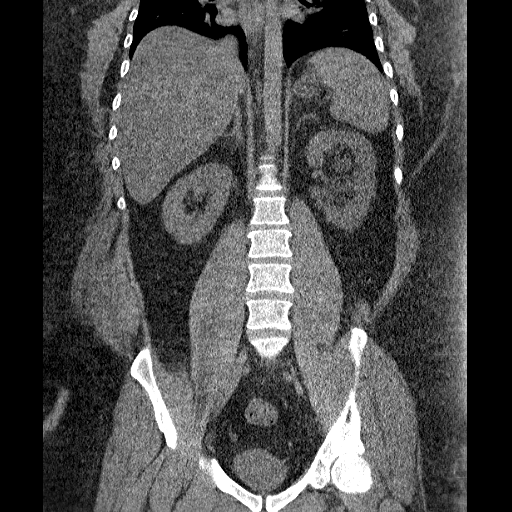

[16 of 42 positions shown; findings below may reference images not displayed]

FINDINGS: There are extensive streak artifacts from patient's large body
habitus which limits examination.

Sagittal images of the spine shows mild degenerative changes lower
thoracic and lumbar spine. The lung bases are unremarkable.

There is fatty infiltration of the liver. No calcified gallstones
are noted within gallbladder. No intrahepatic biliary ductal
dilatation. The pancreas, spleen and adrenal glands are
unremarkable. Kidneys shows a punctate nonobstructive calcified
calculus in midpole of the right kidney posterior aspect measures 2
mm. No left renal calcifications are noted. There is mild left
hydronephrosis. Cortical thinning is noted left kidney. Left UPJ
stricture cannot be excluded. No obstructive calcified calculi are
identified. No hydroureter bilaterally. No calcified ureteral
calculi bilaterally.

No aortic aneurysm. No small bowel obstruction. Normal appendix. No
pericecal inflammation. Bilateral distal ureter is unremarkable. The
urinary bladder is under distended grossly unremarkable.

The terminal ileum is unremarkable. No colonic obstruction. No
ascites or free air. No adenopathy. No destructive bony lesions are
noted within pelvis.
IMPRESSION: 1. Limited study by streaky artifacts from patient's large body
habitus. There is a tiny 2 mm nonobstructive calcified calculus
within mid pole of the right kidney.
2. Mild left hydronephrosis. No obstructive calcified calculus is
noted. UPJ stricture cannot be excluded. Correlation with urology
exam is recommended. Mild left renal cortical thinning.
3. Contracted gallbladder without evidence of calcified gallstones.
4. No calcified ureteral calculi are noted bilaterally.
5. No pericecal inflammation.  Normal appendix.

## 2015-11-21 ENCOUNTER — Ambulatory Visit: Payer: 59 | Admitting: Family Medicine

## 2015-11-24 ENCOUNTER — Encounter: Payer: Self-pay | Admitting: Family Medicine

## 2015-11-24 ENCOUNTER — Telehealth: Payer: Self-pay | Admitting: Family Medicine

## 2015-11-24 ENCOUNTER — Ambulatory Visit (INDEPENDENT_AMBULATORY_CARE_PROVIDER_SITE_OTHER): Payer: 59 | Admitting: Family Medicine

## 2015-11-24 ENCOUNTER — Ambulatory Visit: Payer: 59 | Admitting: Family Medicine

## 2015-11-24 VITALS — BP 147/81 | HR 98 | Temp 96.6°F | Ht 69.0 in | Wt >= 6400 oz

## 2015-11-24 DIAGNOSIS — M545 Low back pain: Secondary | ICD-10-CM

## 2015-11-24 DIAGNOSIS — R1030 Lower abdominal pain, unspecified: Secondary | ICD-10-CM

## 2015-11-24 LAB — MICROSCOPIC EXAMINATION: RENAL EPITHEL UA: NONE SEEN /HPF

## 2015-11-24 LAB — URINALYSIS, COMPLETE
Bilirubin, UA: NEGATIVE
Glucose, UA: NEGATIVE
Ketones, UA: NEGATIVE
Leukocytes, UA: NEGATIVE
NITRITE UA: NEGATIVE
PH UA: 6 (ref 5.0–7.5)
RBC UA: NEGATIVE
Specific Gravity, UA: >1.03 — ABNORMAL HIGH (ref 1.005–1.030)
UUROB: 0.2 mg/dL (ref 0.2–1.0)

## 2015-11-24 MED ORDER — CIPROFLOXACIN HCL 500 MG PO TABS: 500.0000 mg | ORAL_TABLET | Freq: Two times a day (BID) | ORAL | Status: DC

## 2015-11-24 NOTE — Telephone Encounter (Signed)
DX was left on pt VM

## 2015-11-24 NOTE — Progress Notes (Signed)
   Subjective:    Patient ID: Corey Wilcox, male    DOB: 08/14/1976, 40 y.o.   MRN: [id number]  HPI Patient here today back pain that started last Wednesday. Pain actually began in the right testicle and groin. There is now some discomfort in his right lower back. There is no history of kidney stones. He was seen here in 2014 for some blood in his urine but that was never fully explained.     Patient Active Problem List   Diagnosis Date Noted  . Septic shock ([facility]) 11/04/2014  . Sepsis due to cellulitis ([facility]) 11/03/2014  . Hydronephrosis, left 07/29/2014  . Hyperlipidemia 07/25/2014  . Cellulitis of leg, left 07/25/2014  . Abnormal urinalysis 07/25/2014  . Palpitations   . Hyperplasia of prostate   . Morbid obesity ([facility])   . Fatty liver   . Essential hypertension, benign    Outpatient Encounter Prescriptions as of 11/24/2015  Medication Sig  . BENICAR HCT 40-25 MG tablet Take 1 tablet by mouth daily.  . cetirizine (ZYRTEC) 10 MG tablet Take 10 mg by mouth daily.    . cholecalciferol (VITAMIN D) 1000 UNITS tablet Take 2,000 Units by mouth daily.   . naproxen (NAPROSYN) 500 MG tablet   . simvastatin (ZOCOR) 10 MG tablet TAKE 1 TABLET (10 MG TOTAL) BY MOUTH AT BEDTIME.  . [DISCONTINUED] penicillin v potassium (VEETID) 500 MG tablet Take 1 tablet (500 mg total) by mouth daily.  . [DISCONTINUED] azithromycin (ZITHROMAX) 250 MG tablet 2 pills the first day then one daily for infection until completed  . [DISCONTINUED] azithromycin (ZITHROMAX) 250 MG tablet AS DIRECTED   No facility-administered encounter medications on file as of 11/24/2015.      Review of Systems  Constitutional: Negative.   HENT: Negative.   Eyes: Negative.   Respiratory: Negative.   Cardiovascular: Negative.   Gastrointestinal: Negative.   Endocrine: Negative.   Genitourinary: Positive for flank pain (low back and groin pain ).  Musculoskeletal: Negative.   Skin: Negative.   Allergic/Immunologic:  Negative.   Neurological: Negative.   Hematological: Negative.   Psychiatric/Behavioral: Negative.        Objective:   Physical Exam  Constitutional:  Morbidly obese  Genitourinary:  Palpation of the testicle was difficult due to large pannus. There was slight tenderness with palpation of testicle.  Urinalysis was negative for blood in the urine so not likely to be a kidney stone even though the distribution of pain is suggestive of same.  Musculoskeletal:  Back has good range of motion without pain.   BP 147/81 mmHg  Pulse 98  Temp(Src) 96.6 F (35.9 C) (Oral)  Ht 5\' 9"  (1.753 m)  Wt 434 lb (196.861 kg)  BMI 64.06 kg/m2        Assessment & Plan:  1. Groin pain, unspecified laterality Urinalysis is negative for infection or blood. - Urinalysis, Complete  2. Low back pain, unspecified back pain laterality, with sciatica presence unspecified With negative urinalysis will treat for possible epididymitis. Begin Cipro 500 mg twice a day for 5 days. Continue with Naprosyn as he reports that does help pain. - Urinalysis, Complete  Frederica KusterStephen M Shandreka Dante MD

## 2015-12-09 ENCOUNTER — Encounter: Payer: Self-pay | Admitting: Family Medicine

## 2015-12-09 ENCOUNTER — Ambulatory Visit (INDEPENDENT_AMBULATORY_CARE_PROVIDER_SITE_OTHER): Payer: 59 | Admitting: Family Medicine

## 2015-12-09 VITALS — BP 128/90 | HR 107 | Temp 97.2°F | Ht 69.0 in | Wt >= 6400 oz

## 2015-12-09 DIAGNOSIS — R0789 Other chest pain: Secondary | ICD-10-CM | POA: Diagnosis not present

## 2015-12-09 NOTE — Progress Notes (Signed)
   Subjective:    Patient ID: Corey Wilcox, male    DOB: 06/12/1976, 40 y.o.   MRN: [id number]  HPI Patient here today for right side chest pain that was going on yesterday and he is having some left knee pain. Patient was trying to start on more that would not start prior to the onset of these symptoms. He denies any cough or shortness of breath. He is concerned that this might be related to his heart because his father had heart disease.     Patient Active Problem List   Diagnosis Date Noted  . Septic shock (HCC) 11/04/2014  . Sepsis due to cellulitis (HCC) 11/03/2014  . Hydronephrosis, left 07/29/2014  . Hyperlipidemia 07/25/2014  . Cellulitis of leg, left 07/25/2014  . Abnormal urinalysis 07/25/2014  . Palpitations   . Hyperplasia of prostate   . Morbid obesity (HCC)   . Fatty liver   . Essential hypertension, benign    Outpatient Encounter Prescriptions as of 12/09/2015  Medication Sig  . BENICAR HCT 40-25 MG tablet Take 1 tablet by mouth daily.  . cetirizine (ZYRTEC) 10 MG tablet Take 10 mg by mouth daily.    . cholecalciferol (VITAMIN D) 1000 UNITS tablet Take 2,000 Units by mouth daily.   . penicillin v potassium (VEETID) 500 MG tablet Take 500 mg by mouth daily. Maint. drug  . simvastatin (ZOCOR) 10 MG tablet TAKE 1 TABLET (10 MG TOTAL) BY MOUTH AT BEDTIME.  . [DISCONTINUED] ciprofloxacin (CIPRO) 500 MG tablet Take 1 tablet (500 mg total) by mouth 2 (two) times daily.  . [DISCONTINUED] naproxen (NAPROSYN) 500 MG tablet    No facility-administered encounter medications on file as of 12/09/2015.      Review of Systems  Constitutional: Negative.   HENT: Negative.   Eyes: Negative.   Respiratory: Negative.   Cardiovascular: Negative.        Chest pain - right side yesterday   Gastrointestinal: Negative.   Endocrine: Negative.   Genitourinary: Negative.   Musculoskeletal: Positive for arthralgias (left knee pain).  Skin: Negative.   Allergic/Immunologic: Negative.    Neurological: Negative.   Hematological: Negative.   Psychiatric/Behavioral: Negative.        Objective:   Physical Exam  Constitutional: He is oriented to person, place, and time. He appears well-developed and well-nourished.  Cardiovascular: Normal rate, regular rhythm and normal heart sounds.   Pulmonary/Chest: Effort normal. He exhibits tenderness.  Musculoskeletal: Normal range of motion.  Neurological: He is alert and oriented to person, place, and time.   BP 128/90 mmHg  Pulse 107  Temp(Src) 97.2 F (36.2 C) (Oral)  Ht 5\' 9"  (1.753 m)  Wt 431 lb (195.5 kg)  BMI 63.62 kg/m2        Assessment & Plan:  1. Right-sided chest wall pain Given history and tenderness with palpation this most certainly is related to chest wall pain. I have suggested taking Aleve, applying heat) or ice when necessary) and massage with Biofreeze. We discussed cardiac risk factors. In addition the family history he has blood pressure which is being treated and lipids which are being treated. He does not smoke or have diabetes. Frederica KusterStephen M Miller MD

## 2015-12-29 ENCOUNTER — Ambulatory Visit (INDEPENDENT_AMBULATORY_CARE_PROVIDER_SITE_OTHER): Payer: 59 | Admitting: Family Medicine

## 2015-12-29 ENCOUNTER — Ambulatory Visit (INDEPENDENT_AMBULATORY_CARE_PROVIDER_SITE_OTHER): Payer: 59

## 2015-12-29 ENCOUNTER — Encounter: Payer: Self-pay | Admitting: Family Medicine

## 2015-12-29 VITALS — BP 125/81 | HR 121 | Temp 97.0°F | Ht 69.0 in | Wt >= 6400 oz

## 2015-12-29 DIAGNOSIS — I10 Essential (primary) hypertension: Secondary | ICD-10-CM

## 2015-12-29 DIAGNOSIS — R000 Tachycardia, unspecified: Secondary | ICD-10-CM

## 2015-12-29 DIAGNOSIS — E785 Hyperlipidemia, unspecified: Secondary | ICD-10-CM | POA: Diagnosis not present

## 2015-12-29 DIAGNOSIS — M25562 Pain in left knee: Secondary | ICD-10-CM

## 2015-12-29 DIAGNOSIS — Z1211 Encounter for screening for malignant neoplasm of colon: Secondary | ICD-10-CM | POA: Diagnosis not present

## 2015-12-29 DIAGNOSIS — E559 Vitamin D deficiency, unspecified: Secondary | ICD-10-CM | POA: Diagnosis not present

## 2015-12-29 MED ORDER — SIMVASTATIN 10 MG PO TABS: ORAL_TABLET | ORAL | Status: DC

## 2015-12-29 NOTE — Progress Notes (Signed)
Subjective:    Patient ID: Corey Wilcox, male    DOB: Sep 28, 1975, 40 y.o.   MRN: [id number]  HPI Pt here for follow up and management of chronic medical problems which includes hypertension and hyperlipidemia. He is taking medications regularly.This patient has a history of recurring bouts of cellulitis with sepsis. He is being followed by an infectious disease solution. He also has a history of sinus tachycardia. There is a positive family history for heart disease. The patient today is complaining with some left knee pain. He is requesting a refill on simvastatin. We will give him an FOBT to return. He has seen recently for chest wall pain and was given Aleve to take for this. The patient denies any chest pain and his chest wall pain is better. He denies any more shortness of breath than usual. He has no trouble swallowing his food no blood in the stool or black tarry bowel movements. He does occasionally have some heartburn and indigestion. He is passing his water without problems. He attributes the chest wall pain he had to pushing a riding lawn Moore to 3 days before that started. He complains of left knee pain and especially noticeable with arising in the morning and after sitting for prolonged period of time. On reviewing his record he is had a persistent sinus tachycardia for a long time. We discussed that he would be appropriate for him to have a recheck by the cardiologist especially in light of his father's history of an MI. He is in agreement with this. The patient comes to the visit today with his mother.     Patient Active Problem List   Diagnosis Date Noted  . Septic shock ([place]) 11/04/2014  . Sepsis due to cellulitis ([place]) 11/03/2014  . Hydronephrosis, left 07/29/2014  . Hyperlipidemia 07/25/2014  . Cellulitis of leg, left 07/25/2014  . Abnormal urinalysis 07/25/2014  . Palpitations   . Hyperplasia of prostate   . Morbid obesity ([place])   . Fatty liver   . Essential  hypertension, benign    Outpatient Encounter Prescriptions as of 12/29/2015  Medication Sig  . BENICAR HCT 40-25 MG tablet Take 1 tablet by mouth daily.  . cetirizine (ZYRTEC) 10 MG tablet Take 10 mg by mouth daily.    . cholecalciferol (VITAMIN D) 1000 UNITS tablet Take 2,000 Units by mouth daily.   . penicillin v potassium (VEETID) 500 MG tablet Take 500 mg by mouth daily. Maint. drug  . simvastatin (ZOCOR) 10 MG tablet TAKE 1 TABLET (10 MG TOTAL) BY MOUTH AT BEDTIME.   No facility-administered encounter medications on file as of 12/29/2015.      Review of Systems  Constitutional: Negative.   HENT: Negative.   Eyes: Negative.   Respiratory: Negative.   Cardiovascular: Negative.   Gastrointestinal: Negative.   Endocrine: Negative.   Genitourinary: Negative.   Musculoskeletal: Positive for arthralgias (left knee pain).  Skin: Negative.   Allergic/Immunologic: Negative.   Neurological: Negative.   Hematological: Negative.   Psychiatric/Behavioral: Negative.        Objective:   Physical Exam  Constitutional: He is oriented to person, place, and time. He appears well-developed and well-nourished. No distress.  Pleasant and alert  HENT:  Head: Normocephalic and atraumatic.  Right Ear: External ear normal.  Left Ear: External ear normal.  Nose: Nose normal.  Mouth/Throat: Oropharynx is clear and moist. No oropharyngeal exudate.  Eyes: Conjunctivae and EOM are normal. Pupils are equal, round, and reactive to light. Right eye  exhibits no discharge. Left eye exhibits no discharge. No scleral icterus.  Neck: Normal range of motion. Neck supple. No thyromegaly present.  No bruits or thyromegaly  Cardiovascular: Regular rhythm, normal heart sounds and intact distal pulses.   No murmur heard. The heart rate is about 1 20/m and the rhythm is regular.  Pulmonary/Chest: Effort normal and breath sounds normal. No respiratory distress. He has no wheezes. He has no rales. He exhibits no  tenderness.  Clear anteriorly and posteriorly and no axillary adenopathy  Abdominal: Soft. Bowel sounds are normal. He exhibits no mass. There is no tenderness. There is no rebound and no guarding.  Morbid obesity without palpable organ enlargement or masses  Musculoskeletal: Normal range of motion. He exhibits tenderness. He exhibits no edema.  The left knee lateral joint line is tender to palpation. Compared to the right knee there may be some warm of the left lateral knee.  Lymphadenopathy:    He has no cervical adenopathy.  Neurological: He is alert and oriented to person, place, and time. He has normal reflexes. No cranial nerve deficit.  Skin: Skin is warm and dry. No rash noted.  Psychiatric: He has a normal mood and affect. His behavior is normal. Judgment and thought content normal.  Nursing note and vitals reviewed.   BP 125/81 mmHg  Pulse 121  Temp(Src) 97 F (36.1 C) (Oral)  Ht '5\' 9"'$  (1.753 m)  Wt 430 lb (195.047 kg)  BMI 63.47 kg/m2       Assessment & Plan:  1. Hyperlipidemia -Continue simvastatin pending results of lab work which is yet to be done. - CBC with Differential/Platelet; Future - Lipid panel; Future - Ambulatory referral to Cardiology  2. Vitamin D deficiency -The patient has a history of vitamin D deficiency and he should continue with current dose pending results of lab work - CBC with Differential/Platelet; Future - VITAMIN D 25 Hydroxy (Vit-D Deficiency, Fractures); Future  3. Essential hypertension -Continue current treatment - CBC with Differential/Platelet; Future - BMP8+EGFR; Future - Hepatic function panel; Future - Ambulatory referral to Cardiology with diet and exercise   4. Morbid obesity due to excess calories ([place]) -Continue aggressive therapeutic lifestyle changes - CBC with Differential/Platelet; Future - Ambulatory referral to Cardiology  5. Left knee pain -X-ray today and if no improvement with Aleve twice daily after  breakfast and supper the patient will call us and we will arrange an appointment with orthopedic surgeon. - CBC with Differential/Platelet; Future - DG Knee 1-2 Views Left; Future  6. Special screening for malignant neoplasms, colon - Fecal occult blood, imunochemical; Future  7. Essential hypertension, benign -Blood pressure good control today. Continue current treatment  8. Sinus tachycardia ([place]) -The patient has had a persistent sinus tachycardia for a good while. The patient's father died suddenly of an MI. This patient has no chest pain. We will arrange for a visit with the cardiologist when he is seen in the distant past.  Meds ordered this encounter  Medications  . simvastatin (ZOCOR) 10 MG tablet    Sig: TAKE 1 TABLET (10 MG TOTAL) BY MOUTH AT BEDTIME.    Dispense:  90 tablet    Refill:  3   Patient Instructions  Continue current medications. Continue good therapeutic lifestyle changes which include good diet and exercise. Fall precautions discussed with patient. If an FOBT was given today- please return it to our front desk.  After your visit with [place] today you will receive a survey in the mail  or online from [employer] regarding your care with [place]. Please take a moment to fill this out. Your feedback is very important to [place] as you can help [place] better understand your patient needs as well as improve your experience and satisfaction. WE CARE ABOUT YOU!!!   We will arrange for the patient have a visit with the cardiologist because of his persistent sinus tachycardia We will get x-rays of his left knee and he will start taking Aleve over-the-counter 1 twice daily after breakfast and supper, and if his knee does not get better after 3-4 weeks he will call us and we will arrange for him to have an appointment with orthopedic surgeon to further evaluate his knee. In the meantime he should call his infectious disease doctor and make sure that if he sees the orthopedic surgeon that an  injection of cortisone into the knee will not cause any problems with his history of cellulitis in this leg.++++ The patient should return his FOBT He should be persistent at working on his weight and reducing this by all means possible. The patient should reduce his intake of caffeine   Arrie Senate MD

## 2015-12-29 NOTE — Patient Instructions (Addendum)
Continue current medications. Continue good therapeutic lifestyle changes which include good diet and exercise. Fall precautions discussed with patient. If an FOBT was given today- please return it to our front desk.  After your visit with us today you will receive a survey in the mail or online from [employer] regarding your care with us. Please take a moment to fill this out. Your feedback is very important to us as you can help us better understand your patient needs as well as improve your experience and satisfaction. WE CARE ABOUT YOU!!!   We will arrange for the patient have a visit with the cardiologist because of his persistent sinus tachycardia We will get x-rays of his left knee and he will start taking Aleve over-the-counter 1 twice daily after breakfast and supper, and if his knee does not get better after 3-4 weeks he will call us and we will arrange for him to have an appointment with orthopedic surgeon to further evaluate his knee. In the meantime he should call his infectious disease doctor and make sure that if he sees the orthopedic surgeon that an injection of cortisone into the knee will not cause any problems with his history of cellulitis in this leg.++++ The patient should return his FOBT He should be persistent at working on his weight and reducing this by all means possible. The patient should reduce his intake of caffeine

## 2016-02-05 ENCOUNTER — Ambulatory Visit: Payer: 59 | Admitting: Cardiology

## 2016-02-13 IMAGING — CR DG CHEST 1V PORT
1 series · 1 of 1 positions shown · non-contrast
Comparison: 07/25/2014

CLINICAL DATA: Shortness of breath, tachycardia, fever.

EXAM:
PORTABLE CHEST - 1 VIEW

[AP]
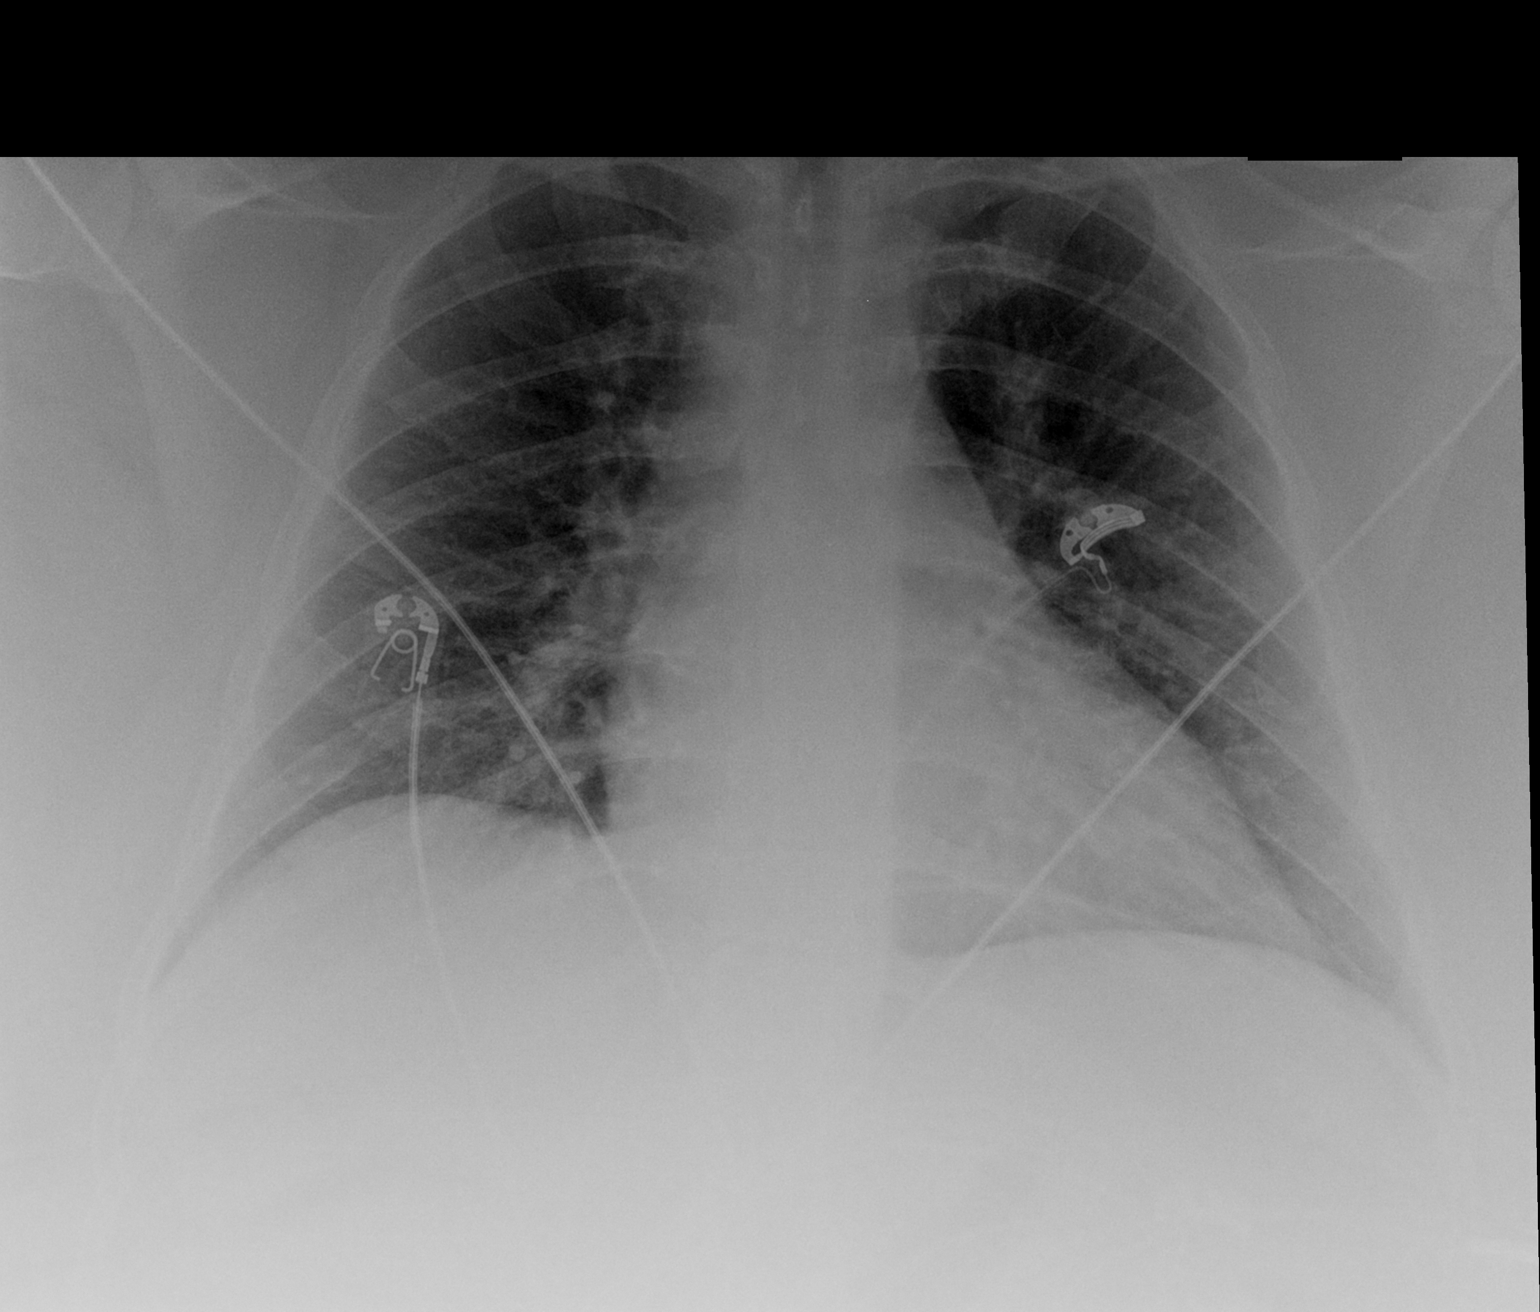

[1 of 1 positions shown; findings below may reference images not displayed]

FINDINGS: Heart is at the upper limits of normal. Pulmonary vasculature is
normal for technique. No consolidation, pleural effusion or
pneumothorax. No acute osseous abnormalities are seen.
IMPRESSION: No acute pulmonary process.

## 2016-03-02 ENCOUNTER — Ambulatory Visit: Payer: 59 | Admitting: Cardiology

## 2016-03-31 ENCOUNTER — Other Ambulatory Visit: Payer: Self-pay | Admitting: Infectious Diseases

## 2016-03-31 DIAGNOSIS — L03119 Cellulitis of unspecified part of limb: Secondary | ICD-10-CM

## 2016-04-08 NOTE — Progress Notes (Deleted)
Cardiology Office Note   Date:  04/09/2016   ID:  Corey Wilcox, DOB [birth year], MRN [id number]  PCP:  Rudi Heap, MD  Cardiologist:   Rollene Rotunda, MD   Chief Complaint  Patient presents with  . Hypertension      History of Present Illness: Corey Wilcox is a 40 y.o. male who presents for evaluation of HTN and increased HR.  He has no past cardiac history.  He has had a couple of echoes in the past.  The last one was when he had sepsis and was in the hosp in 2016.  I reviewed hospital records from 2015 and 16 when he had sepsis related to cellulitis.  He has had NL LV function.  He has had no structural heart disease.  He is not overly active.  He does walk somewhat at work.  The patient denies any new symptoms such as chest discomfort, neck or arm discomfort. There has been no new shortness of breath, PND or orthopnea. There have been no reported palpitations, presyncope or syncope.     Past Medical History:  Diagnosis Date  . Essential hypertension, benign   . Fatty liver   . Hyperplasia of prostate   . Obesity   . Other and unspecified hyperlipidemia   . Palpitations     Past Surgical History:  Procedure Laterality Date  . tympanoplasty       Current Outpatient Prescriptions  Medication Sig Dispense Refill  . cetirizine (ZYRTEC) 10 MG tablet Take 10 mg by mouth daily.      . cholecalciferol (VITAMIN D) 1000 UNITS tablet Take 2,000 Units by mouth daily.     Marland Kitchen olmesartan-hydrochlorothiazide (BENICAR HCT) 40-25 MG tablet Take 0.5 tablets by mouth daily.    . penicillin v potassium (VEETID) 500 MG tablet Take 500 mg by mouth daily. Maint. drug    . simvastatin (ZOCOR) 10 MG tablet TAKE 1 TABLET (10 MG TOTAL) BY MOUTH AT BEDTIME. 90 tablet 3   No current facility-administered medications for this visit.     Allergies:   Cephalexin    Social History:  The patient  reports that he has quit smoking. His smoking use included Cigarettes. He has never used  smokeless tobacco. He reports that he drinks alcohol. He reports that he does not use drugs.   Family History:  The patient's family history includes Atrial fibrillation in his father; Stroke in his father.    ROS:  Please see the history of present illness.   Otherwise, review of systems are positive for none.   All other systems are reviewed and negative.    PHYSICAL EXAM: VS:  BP 130/88 (BP Location: Left Wrist, Patient Position: Sitting, Cuff Size: Normal)   Pulse 87   Ht 5\' 7"  (1.702 m)   Wt (!) 430 lb (195 kg)   BMI 67.35 kg/m  , BMI Body mass index is 67.35 kg/m. GENERAL:  Well appearing HEENT:  Pupils equal round and reactive, fundi not visualized, oral mucosa unremarkable NECK:  No jugular venous distention, waveform within normal limits, carotid upstroke brisk and symmetric, no bruits, no thyromegaly LYMPHATICS:  No cervical, inguinal adenopathy LUNGS:  Clear to auscultation bilaterally BACK:  No CVA tenderness CHEST:  Unremarkable HEART:  PMI not displaced or sustained,S1 and S2 within normal limits, no S3, no S4, no clicks, no rubs, no murmurs ABD:  Flat, positive bowel sounds normal in frequency in pitch, no bruits, no rebound, no guarding, no midline pulsatile mass, no  hepatomegaly, no splenomegaly, morbidly obese EXT:  2 plus pulses throughout, no edema, no cyanosis no clubbing SKIN:  No rashes no nodules NEURO:  Cranial nerves II through XII grossly intact, motor grossly intact throughout PSYCH:  Cognitively intact, oriented to person place and time    EKG:  EKG is ordered today. The ekg ordered today demonstrates NSR, rate 87, axis WNL, intervals WNL, no acute ST T wave changes.     Recent Labs: No results found for requested labs within last 8760 hours.    Lipid Panel    Component Value Date/Time   CHOL 177 03/05/2015 0843   TRIG 199 (H) 03/05/2015 0843   HDL 45 03/05/2015 0843   CHOLHDL 3.9 03/05/2015 0843   LDLCALC 92 03/05/2015 0843      Wt  Readings from Last 3 Encounters:  04/09/16 (!) 430 lb (195 kg)  12/29/15 (!) 430 lb (195 kg)  12/09/15 (!) 431 lb (195.5 kg)      Other studies Reviewed: Additional studies/ records that were reviewed today include: Office records and labwork. Review of the above records demonstrates:  Please see elsewhere in the note.     ASSESSMENT AND PLAN:  HTN:  The blood pressure is at target. No change in medications is indicated. We will continue with therapeutic lifestyle changes (TLC).  TACHYCARDIA:  I don't see any significant dysrhythmias. He's not had any symptoms. Further workup is suggested. His exam and previous workup would not suggest focal heart disease.  MORBID OBESITY:  I discussed at length with him. Very attainable and specific suggestions for diet and exercise.     Current medicines are reviewed at length with the patient today.  The patient does not have concerns regarding medicines.  The following changes have been made:  no change  Labs/ tests ordered today include: none  Orders Placed This Encounter  Procedures  . EKG 12-Lead     Disposition:   FU with me as needed.    Signed, Rollene Rotunda, MD  04/09/2016 5:15 PM    [facility]

## 2016-04-08 NOTE — Progress Notes (Deleted)
Subjective:    Patient ID: Corey Wilcox, male    DOB: [birth year], 40 y.o.   MRN: [id number]  HPI Pt here for follow up and management of chronic medical problems which includes hypertension and hyperlipidemia. He is taking medications regularly.This patient has a history of recurring bouts of cellulitis with sepsis. He is being followed by an infectious disease solution. He also has a history of sinus tachycardia. There is a positive family history for heart disease. The patient today is complaining with some left knee pain. He is requesting a refill on simvastatin. We will give him an FOBT to return. He has seen recently for chest wall pain and was given Aleve to take for this. The patient denies any chest pain and his chest wall pain is better. He denies any more shortness of breath than usual. He has no trouble swallowing his food no blood in the stool or black tarry bowel movements. He does occasionally have some heartburn and indigestion. He is passing his water without problems. He attributes the chest wall pain he had to pushing a riding lawn Moore to 3 days before that started. He complains of left knee pain and especially noticeable with arising in the morning and after sitting for prolonged period of time. On reviewing his record he is had a persistent sinus tachycardia for a long time. We discussed that he would be appropriate for him to have a recheck by the cardiologist especially in light of his father's history of an MI. He is in agreement with this. The patient comes to the visit today with his mother.     Patient Active Problem List   Diagnosis Date Noted  . Septic shock ([place]) 11/04/2014  . Sepsis due to cellulitis ([place]) 11/03/2014  . Hydronephrosis, left 07/29/2014  . Hyperlipidemia 07/25/2014  . Cellulitis of leg, left 07/25/2014  . Abnormal urinalysis 07/25/2014  . Palpitations   . Hyperplasia of prostate   . Morbid obesity ([place])   . Fatty liver   . Essential  hypertension, benign    Outpatient Encounter Prescriptions as of 04/09/2016  Medication Sig  . BENICAR HCT 40-25 MG tablet Take 1 tablet by mouth daily.  . cetirizine (ZYRTEC) 10 MG tablet Take 10 mg by mouth daily.    . cholecalciferol (VITAMIN D) 1000 UNITS tablet Take 2,000 Units by mouth daily.   . penicillin v potassium (VEETID) 500 MG tablet Take 500 mg by mouth daily. Maint. drug  . penicillin v potassium (VEETID) 500 MG tablet TAKE 1 TABLET BY MOUTH EVERY DAY  . simvastatin (ZOCOR) 10 MG tablet TAKE 1 TABLET (10 MG TOTAL) BY MOUTH AT BEDTIME.   No facility-administered encounter medications on file as of 04/09/2016.       Review of Systems  Constitutional: Negative.   HENT: Negative.   Eyes: Negative.   Respiratory: Negative.   Cardiovascular: Negative.   Gastrointestinal: Negative.   Endocrine: Negative.   Genitourinary: Negative.   Musculoskeletal: Positive for arthralgias (left knee pain).  Skin: Negative.   Allergic/Immunologic: Negative.   Neurological: Negative.   Hematological: Negative.   Psychiatric/Behavioral: Negative.        Objective:   Physical Exam  Constitutional: He is oriented to person, place, and time. He appears well-developed and well-nourished. No distress.  Pleasant and alert  HENT:  Head: Normocephalic and atraumatic.  Right Ear: External ear normal.  Left Ear: External ear normal.  Nose: Nose normal.  Mouth/Throat: Oropharynx is clear and moist. No oropharyngeal exudate.  Eyes: Conjunctivae and EOM are normal. Pupils are equal, round, and reactive to light. Right eye exhibits no discharge. Left eye exhibits no discharge. No scleral icterus.  Neck: Normal range of motion. Neck supple. No thyromegaly present.  No bruits or thyromegaly  Cardiovascular: Regular rhythm, normal heart sounds and intact distal pulses.   No murmur heard. The heart rate is about 1 20/m and the rhythm is regular.  Pulmonary/Chest: Effort normal and breath sounds  normal. No respiratory distress. He has no wheezes. He has no rales. He exhibits no tenderness.  Clear anteriorly and posteriorly and no axillary adenopathy  Abdominal: Soft. Bowel sounds are normal. He exhibits no mass. There is no tenderness. There is no rebound and no guarding.  Morbid obesity without palpable organ enlargement or masses  Musculoskeletal: Normal range of motion. He exhibits tenderness. He exhibits no edema.  The left knee lateral joint line is tender to palpation. Compared to the right knee there may be some warm of the left lateral knee.  Lymphadenopathy:    He has no cervical adenopathy.  Neurological: He is alert and oriented to person, place, and time. He has normal reflexes. No cranial nerve deficit.  Skin: Skin is warm and dry. No rash noted.  Psychiatric: He has a normal mood and affect. His behavior is normal. Judgment and thought content normal.  Nursing note and vitals reviewed.   There were no vitals taken for this visit.       Assessment & Plan:  1. Hyperlipidemia -Continue simvastatin pending results of lab work which is yet to be done. - CBC with Differential/Platelet; Future - Lipid panel; Future - Ambulatory referral to Cardiology  2. Vitamin D deficiency -The patient has a history of vitamin D deficiency and he should continue with current dose pending results of lab work - CBC with Differential/Platelet; Future - VITAMIN D 25 Hydroxy (Vit-D Deficiency, Fractures); Future  3. Essential hypertension -Continue current treatment - CBC with Differential/Platelet; Future - BMP8+EGFR; Future - Hepatic function panel; Future - Ambulatory referral to Cardiology with diet and exercise   4. Morbid obesity due to excess calories ([place]) -Continue aggressive therapeutic lifestyle changes - CBC with Differential/Platelet; Future - Ambulatory referral to Cardiology  5. Left knee pain -X-ray today and if no improvement with Aleve twice daily after  breakfast and supper the patient will call us and we will arrange an appointment with orthopedic surgeon. - CBC with Differential/Platelet; Future - DG Knee 1-2 Views Left; Future  6. Special screening for malignant neoplasms, colon - Fecal occult blood, imunochemical; Future  7. Essential hypertension, benign -Blood pressure good control today. Continue current treatment  8. Sinus tachycardia ([place]) -The patient has had a persistent sinus tachycardia for a good while. The patient's father died suddenly of an MI. This patient has no chest pain. We will arrange for a visit with the cardiologist when he is seen in the distant past.  No orders of the defined types were placed in this encounter.  There are no Patient Instructions on file for this visit. Arrie Senate MD      Cardiology Office Note   Date:  04/08/2016   ID:  Temesgen Weightman, DOB 1976/04/27, MRN [id number]  PCP:  Redge Gainer, MD  Cardiologist:   Minus Breeding, MD   No chief complaint on file.     History of Present Illness: Akash Winski is a 40 y.o. male who presents for ***    Past Medical History:  Diagnosis Date  .  Essential hypertension, benign   . Fatty liver   . Hyperplasia of prostate   . Obesity   . Other and unspecified hyperlipidemia   . Palpitations     Past Surgical History:  Procedure Laterality Date  . tympanoplasty       Current Outpatient Prescriptions  Medication Sig Dispense Refill  . BENICAR HCT 40-25 MG tablet Take 1 tablet by mouth daily. 90 tablet 1  . cetirizine (ZYRTEC) 10 MG tablet Take 10 mg by mouth daily.      . cholecalciferol (VITAMIN D) 1000 UNITS tablet Take 2,000 Units by mouth daily.     . penicillin v potassium (VEETID) 500 MG tablet Take 500 mg by mouth daily. Maint. drug    . penicillin v potassium (VEETID) 500 MG tablet TAKE 1 TABLET BY MOUTH EVERY DAY 30 tablet 2  . simvastatin (ZOCOR) 10 MG tablet TAKE 1 TABLET (10 MG TOTAL) BY MOUTH AT BEDTIME. 90 tablet  3   No current facility-administered medications for this visit.     Allergies:   Cephalexin    Social History:  The patient  reports that he has never smoked. He has never used smokeless tobacco. He reports that he drinks alcohol. He reports that he does not use drugs.   Family History:  The patient's ***family history includes Atrial fibrillation in his father; Stroke in his father.    ROS:  Please see the history of present illness.   Otherwise, review of systems are positive for {NONE DEFAULTED:18576::"none"}.   All other systems are reviewed and negative.    PHYSICAL EXAM: VS:  There were no vitals taken for this visit. , BMI There is no height or weight on file to calculate BMI. GENERAL:  Well appearing HEENT:  Pupils equal round and reactive, fundi not visualized, oral mucosa unremarkable NECK:  No jugular venous distention, waveform within normal limits, carotid upstroke brisk and symmetric, no bruits, no thyromegaly LYMPHATICS:  No cervical, inguinal adenopathy LUNGS:  Clear to auscultation bilaterally BACK:  No CVA tenderness CHEST:  Unremarkable HEART:  PMI not displaced or sustained,S1 and S2 within normal limits, no S3, no S4, no clicks, no rubs, *** murmurs ABD:  Flat, positive bowel sounds normal in frequency in pitch, no bruits, no rebound, no guarding, no midline pulsatile mass, no hepatomegaly, no splenomegaly EXT:  2 plus pulses throughout, no edema, no cyanosis no clubbing SKIN:  No rashes no nodules NEURO:  Cranial nerves II through XII grossly intact, motor grossly intact throughout PSYCH:  Cognitively intact, oriented to person place and time    EKG:  EKG {ACTION; IS/IS WER:15400867} ordered today. The ekg ordered today demonstrates ***   Recent Labs: No results found for requested labs within last 8760 hours.    Lipid Panel    Component Value Date/Time   CHOL 177 03/05/2015 0843   TRIG 199 (H) 03/05/2015 0843   HDL 45 03/05/2015 0843   CHOLHDL  3.9 03/05/2015 0843   LDLCALC 92 03/05/2015 0843      Wt Readings from Last 3 Encounters:  12/29/15 (!) 430 lb (195 kg)  12/09/15 (!) 431 lb (195.5 kg)  11/24/15 (!) 434 lb (196.9 kg)      Other studies Reviewed: Additional studies/ records that were reviewed today include: ***. Review of the above records demonstrates:  Please see elsewhere in the note.  ***   ASSESSMENT AND PLAN:  ***   Current medicines are reviewed at length with the patient today.  The  patient {ACTIONS; HAS/DOES NOT HAVE:19233} concerns regarding medicines.  The following changes have been made:  {PLAN; NO CHANGE:13088:s}  Labs/ tests ordered today include: *** No orders of the defined types were placed in this encounter.    Disposition:   FU with ***    Signed, Minus Breeding, MD  04/08/2016 8:10 AM    [place]

## 2016-04-09 ENCOUNTER — Encounter (INDEPENDENT_AMBULATORY_CARE_PROVIDER_SITE_OTHER): Payer: Self-pay

## 2016-04-09 ENCOUNTER — Ambulatory Visit (INDEPENDENT_AMBULATORY_CARE_PROVIDER_SITE_OTHER): Payer: 59 | Admitting: Cardiology

## 2016-04-09 ENCOUNTER — Encounter: Payer: Self-pay | Admitting: Cardiology

## 2016-04-09 VITALS — BP 130/88 | HR 87 | Ht 67.0 in | Wt >= 6400 oz

## 2016-04-09 DIAGNOSIS — I10 Essential (primary) hypertension: Secondary | ICD-10-CM

## 2016-04-09 NOTE — Patient Instructions (Signed)
Your physician recommends that you schedule a follow-up appointment in: As Needed    

## 2016-04-11 NOTE — Progress Notes (Signed)
Error

## 2016-04-15 ENCOUNTER — Encounter: Payer: Self-pay | Admitting: *Deleted

## 2016-05-14 ENCOUNTER — Ambulatory Visit: Payer: 59 | Admitting: Family Medicine

## 2016-05-19 ENCOUNTER — Ambulatory Visit: Payer: 59 | Admitting: Family Medicine

## 2016-06-28 ENCOUNTER — Ambulatory Visit: Payer: 59 | Admitting: Infectious Diseases

## 2016-07-04 ENCOUNTER — Other Ambulatory Visit: Payer: Self-pay | Admitting: Infectious Diseases

## 2016-07-04 DIAGNOSIS — L03119 Cellulitis of unspecified part of limb: Secondary | ICD-10-CM

## 2016-07-05 ENCOUNTER — Ambulatory Visit: Payer: 59 | Admitting: Infectious Diseases

## 2016-07-05 ENCOUNTER — Encounter: Payer: Self-pay | Admitting: Infectious Diseases

## 2016-07-05 ENCOUNTER — Ambulatory Visit (INDEPENDENT_AMBULATORY_CARE_PROVIDER_SITE_OTHER): Payer: 59 | Admitting: Infectious Diseases

## 2016-07-05 DIAGNOSIS — I10 Essential (primary) hypertension: Secondary | ICD-10-CM | POA: Diagnosis not present

## 2016-07-05 DIAGNOSIS — L0390 Cellulitis, unspecified: Secondary | ICD-10-CM

## 2016-07-05 DIAGNOSIS — A419 Sepsis, unspecified organism: Secondary | ICD-10-CM

## 2016-07-05 MED ORDER — PENICILLIN V POTASSIUM 500 MG PO TABS: 500.0000 mg | ORAL_TABLET | Freq: Every day | ORAL | 3 refills | Status: DC

## 2016-07-05 NOTE — Assessment & Plan Note (Signed)
Discussed with pt and his mom.  Will continue his support stockings, keep legs up when he can.  Will continue PEN for now. Will see him back in 1 year.

## 2016-07-05 NOTE — Progress Notes (Signed)
   Subjective:    Patient ID: Corey Wilcox, male    DOB: 04/23/1976, 40 y.o.   MRN: [id number]  HPI 40 yo M with hx of morbid obesity (425#), fatty liver, who was admitted in Nov 2015 with sepsis and cellulitis.  Her returned 2-28 to 11-06-14 with the same. He was treated with vanco/levaquin due to a severe PEN allergy. He was tapered to vanco alone then to doxy. He was given this for 10 days then changed to low dose Clinda. His doppler was (-).  He was seen by allergy and had PEN challenge. Changed to PEN earlier this year.   Uses antibacterial soap. Dial.  Still using compression stockings. Doesn't use on w/e.  No problems with PEN. No further episodes of cellulitis.   Review of Systems  Constitutional: Negative for chills, fever and unexpected weight change.  Skin: Negative for rash.   Has been trying to watch what he eats. Not able to exercise.      Objective:   Physical Exam  Constitutional: He appears well-developed and well-nourished.  Musculoskeletal:  B Le non-pitting edema. No foot lesions. Skin intact, no erythema or rash.           Assessment & Plan:

## 2016-07-05 NOTE — Assessment & Plan Note (Signed)
BP well controlled in clinic.  I repeated with pt.

## 2016-07-05 NOTE — Assessment & Plan Note (Signed)
Encouraged him to diet and exercise.  

## 2016-07-13 ENCOUNTER — Other Ambulatory Visit: Payer: Self-pay | Admitting: *Deleted

## 2016-07-13 MED ORDER — OLMESARTAN MEDOXOMIL-HCTZ 40-25 MG PO TABS: 0.5000 | ORAL_TABLET | Freq: Every day | ORAL | 3 refills | Status: DC

## 2016-09-20 ENCOUNTER — Ambulatory Visit (INDEPENDENT_AMBULATORY_CARE_PROVIDER_SITE_OTHER): Payer: BLUE CROSS/BLUE SHIELD | Admitting: Family Medicine

## 2016-09-20 ENCOUNTER — Encounter: Payer: Self-pay | Admitting: Family Medicine

## 2016-09-20 VITALS — BP 131/80 | HR 95 | Temp 97.0°F | Ht 68.5 in | Wt >= 6400 oz

## 2016-09-20 DIAGNOSIS — I10 Essential (primary) hypertension: Secondary | ICD-10-CM

## 2016-09-20 DIAGNOSIS — E7800 Pure hypercholesterolemia, unspecified: Secondary | ICD-10-CM

## 2016-09-20 DIAGNOSIS — E559 Vitamin D deficiency, unspecified: Secondary | ICD-10-CM

## 2016-09-20 DIAGNOSIS — Z0000 Encounter for general adult medical examination without abnormal findings: Secondary | ICD-10-CM | POA: Diagnosis not present

## 2016-09-20 DIAGNOSIS — Z8619 Personal history of other infectious and parasitic diseases: Secondary | ICD-10-CM | POA: Insufficient documentation

## 2016-09-20 LAB — URINALYSIS, COMPLETE
BILIRUBIN UA: NEGATIVE
GLUCOSE, UA: NEGATIVE
LEUKOCYTES UA: NEGATIVE
NITRITE UA: NEGATIVE
RBC UA: NEGATIVE
Urobilinogen, Ur: 0.2 mg/dL (ref 0.2–1.0)
pH, UA: 5 (ref 5.0–7.5)

## 2016-09-20 LAB — MICROSCOPIC EXAMINATION
EPITHELIAL CELLS (NON RENAL): NONE SEEN /HPF (ref 0–10)
Renal Epithel, UA: NONE SEEN /hpf

## 2016-09-20 NOTE — Progress Notes (Signed)
Subjective:    Patient ID: Corey Wilcox, male    DOB: 23-Jan-1976, 41 y.o.   MRN: [id number]  HPI Patient is here today for annual wellness exam and follow up of chronic medical problems which includes hypertension and hyperlipidemia. He is taking medications regularly.This patient has a history of sepsis secondary to cellulitis. He has had several hospitalizations for this. He refuses the flu shot. He complains of headaches today and is requesting a refill on his simvastatin. It is important to note that he has morbid obesity and has lost 6 pounds down to 419. He will get lab work today and a urinalysis today. The patient denies any chest pain or shortness of breath anymore than usual. He says he has occasional indigestion and has had ranitidine to take home for this in the past and still has some of this. This is not frequent. He denies any blood in the stool or black tarry bowel movements or change in bowel habits. He says he's passing his water without problems. He has had the headache in the medial upper corner of the left orbit and applying a little bit of pressure seems to help this is especially bad when the weather changes and a temperature changes are going on and he notices that he blows his nose and maybe a little bit of blood in the congestion that he blows out. He does not have any neck pain. In reviewing his family history it is positive for ovarian cancer and his grandmother and stroke in his father. 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 It is important to note that his lost 6 pounds at this visit since the last visit.    Patient Active Problem List   Diagnosis Date Noted  . Septic shock ([place]) 11/04/2014  . Sepsis due to cellulitis ([place]) 11/03/2014  . Hydronephrosis, left 07/29/2014  . Hyperlipidemia 07/25/2014  . Cellulitis of leg, left 07/25/2014  . Abnormal urinalysis 07/25/2014  . Palpitations   . Hyperplasia of prostate   .  Morbid obesity ([place])   . Fatty liver   . Essential hypertension, benign    Outpatient Encounter Prescriptions as of 09/20/2016  Medication Sig  . cetirizine (ZYRTEC) 10 MG tablet Take 10 mg by mouth daily.    . cholecalciferol (VITAMIN D) 1000 UNITS tablet Take 2,000 Units by mouth daily.   Marland Kitchen olmesartan-hydrochlorothiazide (BENICAR HCT) 40-25 MG tablet Take 0.5 tablets by mouth daily.  . penicillin v potassium (VEETID) 500 MG tablet Take 1 tablet (500 mg total) by mouth daily. Maint. drug  . simvastatin (ZOCOR) 10 MG tablet TAKE 1 TABLET (10 MG TOTAL) BY MOUTH AT BEDTIME.   No facility-administered encounter medications on file as of 09/20/2016.       Review of Systems  Constitutional: Negative.   HENT: Negative.   Eyes: Negative.   Respiratory: Negative.   Cardiovascular: Negative.   Gastrointestinal: Negative.   Endocrine: Negative.   Genitourinary: Negative.   Musculoskeletal: Negative.   Skin: Negative.   Allergic/Immunologic: Negative.   Neurological: Positive for headaches (worse with COLD days).  Hematological: Negative.   Psychiatric/Behavioral: Negative.        Objective:   Physical Exam  Constitutional: He is oriented to person, place, and time. He appears well-developed and well-nourished. No distress.  HENT:  Head: Normocephalic and atraumatic.  Right Ear: External ear normal.  Left Ear: External ear normal.  Mouth/Throat: Oropharynx is clear and moist.  No oropharyngeal exudate.  Slight nasal turbinate congestion bilaterally and throat was clear.  Eyes: Conjunctivae and EOM are normal. Pupils are equal, round, and reactive to light. Right eye exhibits no discharge. Left eye exhibits no discharge. No scleral icterus.  Neck: Normal range of motion. Neck supple. No thyromegaly present.  No anterior cervical adenopathy  Cardiovascular: Normal rate, regular rhythm, normal heart sounds and intact distal pulses.   No murmur heard. Heart had a regular rate and rhythm  at 72/m  Pulmonary/Chest: Effort normal and breath sounds normal. No respiratory distress. He has no wheezes. He has no rales. He exhibits no tenderness.  Clear anteriorly and posteriorly and no axillary adenopathy and no chest wall masses  Abdominal: Soft. Bowel sounds are normal. He exhibits no mass. There is no tenderness. There is no rebound and no guarding.  Morbid obesity with no palpable liver or spleen or masses and no tenderness  Genitourinary: Penis normal.  Genitourinary Comments: No hernias were felt and no rectal exam was done today as there is no family history of prostate cancer and he is having no symptoms with passing his water. Only external genitalia were checked.  Musculoskeletal: Normal range of motion. He exhibits no edema.  Lymphadenopathy:    He has no cervical adenopathy.  Neurological: He is alert and oriented to person, place, and time. He has normal reflexes. No cranial nerve deficit.  Skin: Skin is warm and dry. No rash noted.  Psychiatric: He has a normal mood and affect. His behavior is normal. Judgment and thought content normal.  Nursing note and vitals reviewed.  BP 131/80 (BP Location: Left Arm)   Pulse 95   Temp 97 F (36.1 C) (Oral)   Ht 5' 8.5" (1.74 m)   Wt (!) 419 lb (190.1 kg)   BMI 62.78 kg/m         Assessment & Plan:  1. Annual physical exam -The patient has an eye exam scheduled for February and we'll make sure that we get a copy of the report - BMP8+EGFR - CBC with Differential/Platelet - Hepatic function panel - VITAMIN D 25 Hydroxy (Vit-D Deficiency, Fractures) - Lipid panel - PSA, total and free - Urinalysis, Complete  2. Pure hypercholesterolemia -Continue aggressive therapeutic lifestyle changes with diet exercise to achieve weight loss. -Continue with simvastatin - CBC with Differential/Platelet - Lipid panel  3. Vitamin D deficiency -Continue current treatment pending results of lab work - CBC with  Differential/Platelet - VITAMIN D 25 Hydroxy (Vit-D Deficiency, Fractures)  4. Essential hypertension -The blood pressure is good today and he will continue with current treatment - BMP8+EGFR - CBC with Differential/Platelet - Hepatic function panel  5. Morbid obesity due to excess calories ([place]) -Recommended aggressive therapeutic lifestyle changes and joining a support group light Weight Watchers which we have in this office.  6. History of sepsis -Continue penicillin and follow-up with infectious disease as planned  Simvastatin will be refilled.  Patient Instructions  Continue current medications. Continue good therapeutic lifestyle changes which include good diet and exercise. Fall precautions discussed with patient. If an FOBT was given today- please return it to our front desk. If you are over 86 years old - you may need Prevnar 19 or the adult Pneumonia vaccine.  **Flu shots are available--- please call and schedule a FLU-CLINIC appointment**  After your visit with [place] today you will receive a survey in the mail or online from [employer] regarding your care with [place]. Please take a moment  to fill this out. Your feedback is very important to [place] as you can help [place] better understand your patient needs as well as improve your experience and satisfaction. WE CARE ABOUT YOU!!!   The patient should use a cool mist humidifier in his bedroom at nighttime He should use nasal saline frequently during the day He should continue to make all efforts to lose weight and consider something like Weight Watchers. We do have a Weight Watchers program here that needs on Wednesday afternoons between 5 and 6 if he is interested. Stay as active physically as possible Do not forget to get your eye exam and make sure that they send [place] a copy of the report  Arrie Senate MD

## 2016-09-20 NOTE — Patient Instructions (Addendum)
Continue current medications. Continue good therapeutic lifestyle changes which include good diet and exercise. Fall precautions discussed with patient. If an FOBT was given today- please return it to our front desk. If you are over 41 years old - you may need Prevnar 13 or the adult Pneumonia vaccine.  **Flu shots are available--- please call and schedule a FLU-CLINIC appointment**  After your visit with us today you will receive a survey in the mail or online from [employer] regarding your care with us. Please take a moment to fill this out. Your feedback is very important to us as you can help us better understand your patient needs as well as improve your experience and satisfaction. WE CARE ABOUT YOU!!!   The patient should use a cool mist humidifier in his bedroom at nighttime He should use nasal saline frequently during the day He should continue to make all efforts to lose weight and consider something like Weight Watchers. We do have a Weight Watchers program here that needs on Wednesday afternoons between 5 and 6 if he is interested. Stay as active physically as possible Do not forget to get your eye exam and make sure that they send us a copy of the report

## 2016-09-21 LAB — HEPATIC FUNCTION PANEL
ALT: 46 IU/L — AB (ref 0–44)
AST: 26 IU/L (ref 0–40)
Albumin: 3.9 g/dL (ref 3.5–5.5)
Alkaline Phosphatase: 55 IU/L (ref 39–117)
Bilirubin Total: 0.3 mg/dL (ref 0.0–1.2)
Bilirubin, Direct: 0.12 mg/dL (ref 0.00–0.40)
TOTAL PROTEIN: 6 g/dL (ref 6.0–8.5)

## 2016-09-21 LAB — CBC WITH DIFFERENTIAL/PLATELET
BASOS ABS: 0.1 10*3/uL (ref 0.0–0.2)
Basos: 1 %
EOS (ABSOLUTE): 0.2 10*3/uL (ref 0.0–0.4)
Eos: 4 %
Hematocrit: 45.7 % (ref 37.5–51.0)
Hemoglobin: 15.7 g/dL (ref 13.0–17.7)
Immature Grans (Abs): 0 10*3/uL (ref 0.0–0.1)
Immature Granulocytes: 0 %
LYMPHS ABS: 1.8 10*3/uL (ref 0.7–3.1)
Lymphs: 28 %
MCH: 31.8 pg (ref 26.6–33.0)
MCHC: 34.4 g/dL (ref 31.5–35.7)
MCV: 93 fL (ref 79–97)
MONOS ABS: 1 10*3/uL — AB (ref 0.1–0.9)
Monocytes: 15 %
NEUTROS ABS: 3.5 10*3/uL (ref 1.4–7.0)
Neutrophils: 52 %
PLATELETS: 307 10*3/uL (ref 150–379)
RBC: 4.94 x10E6/uL (ref 4.14–5.80)
RDW: 15.1 % (ref 12.3–15.4)
WBC: 6.6 10*3/uL (ref 3.4–10.8)

## 2016-09-21 LAB — PSA, TOTAL AND FREE
PROSTATE SPECIFIC AG, SERUM: 0.3 ng/mL (ref 0.0–4.0)
PSA FREE PCT: 56.7 %
PSA, Free: 0.17 ng/mL

## 2016-09-21 LAB — BMP8+EGFR
BUN / CREAT RATIO: 17 (ref 9–20)
BUN: 13 mg/dL (ref 6–24)
CALCIUM: 9.5 mg/dL (ref 8.7–10.2)
CHLORIDE: 101 mmol/L (ref 96–106)
CO2: 27 mmol/L (ref 18–29)
Creatinine, Ser: 0.78 mg/dL (ref 0.76–1.27)
GFR calc non Af Amer: 113 mL/min/{1.73_m2} (ref >59)
GFR, EST AFRICAN AMERICAN: 131 mL/min/{1.73_m2} (ref >59)
Glucose: 88 mg/dL (ref 65–99)
POTASSIUM: 4.3 mmol/L (ref 3.5–5.2)
SODIUM: 142 mmol/L (ref 134–144)

## 2016-09-21 LAB — LIPID PANEL
CHOLESTEROL TOTAL: 171 mg/dL (ref 100–199)
Chol/HDL Ratio: 3.6 ratio units (ref 0.0–5.0)
HDL: 48 mg/dL (ref >39)
LDL Calculated: 96 mg/dL (ref 0–99)
Triglycerides: 134 mg/dL (ref 0–149)
VLDL CHOLESTEROL CAL: 27 mg/dL (ref 5–40)

## 2016-09-21 LAB — VITAMIN D 25 HYDROXY (VIT D DEFICIENCY, FRACTURES): Vit D, 25-Hydroxy: 40.1 ng/mL (ref 30.0–100.0)

## 2016-10-14 ENCOUNTER — Ambulatory Visit (INDEPENDENT_AMBULATORY_CARE_PROVIDER_SITE_OTHER): Payer: BLUE CROSS/BLUE SHIELD | Admitting: Family Medicine

## 2016-10-14 ENCOUNTER — Telehealth: Payer: Self-pay | Admitting: Family Medicine

## 2016-10-14 ENCOUNTER — Telehealth: Payer: Self-pay

## 2016-10-14 ENCOUNTER — Encounter: Payer: Self-pay | Admitting: Family Medicine

## 2016-10-14 VITALS — BP 164/83 | HR 128 | Temp 99.2°F | Ht 68.5 in | Wt >= 6400 oz

## 2016-10-14 DIAGNOSIS — J111 Influenza due to unidentified influenza virus with other respiratory manifestations: Secondary | ICD-10-CM

## 2016-10-14 MED ORDER — AZITHROMYCIN 250 MG PO TABS: ORAL_TABLET | ORAL | 0 refills | Status: DC

## 2016-10-14 MED ORDER — OSELTAMIVIR PHOSPHATE 75 MG PO CAPS: 75.0000 mg | ORAL_CAPSULE | Freq: Two times a day (BID) | ORAL | 0 refills | Status: DC

## 2016-10-14 NOTE — Telephone Encounter (Signed)
Advised pt of MD feedback and pt voiced understanding. 

## 2016-10-14 NOTE — Progress Notes (Signed)
   HPI  Patient presents today with flulike illness.  Patient complains of cough, headache, wheezing, chills, mild body aches.  Patient has subjective fever responding well to Tylenol.  He still tolerating food and fluids normally.  Patient also takes care of his 41 year old mother and would like to get Tamiflu for her.   PMH: Smoking status noted ROS: Per HPI  Objective: BP (!) 164/83   Pulse (!) 128   Temp 99.2 F (37.3 C) (Oral)   Ht 5' 8.5" (1.74 m)   Wt (!) 421 lb 12.8 oz (191.3 kg)   BMI 63.20 kg/m  Gen: NAD, alert, cooperative with exam HEENT: NCAT CV: RRR, good S1/S2, no murmur Resp: Nonlabored, expiratory rhonchorous sounds throughout Ext: No edema, warm Neuro: Alert and oriented, No gross deficits  Assessment and plan:  # Influenza Clinical diagnosis Patient appears well hydrated and is in no distress, despite Tachy Lung exam is reassuring with good air movement, considering rhonchorus sounds I sent in azithro to cover for developing/superimposed CAP Tx with tamiflu Discussed usual course of illness, RTC with any concerns    Meds ordered this encounter  Medications  . oseltamivir (TAMIFLU) 75 MG capsule    Sig: Take 1 capsule (75 mg total) by mouth 2 (two) times daily.    Dispense:  10 capsule    Refill:  0  . azithromycin (ZITHROMAX) 250 MG tablet    Sig: Take 2 tablets on day 1 and 1 tablet daily after that    Dispense:  6 tablet    Refill:  0    Murtis SinkSam Maureen Delatte, MD [facility] 10/14/2016, 6:30 PM

## 2016-10-14 NOTE — Telephone Encounter (Signed)
Ache Mucinex plain maximum strength, blue and white in color, 1 twice daily with a large glass of water for cough and congestion. Take Tylenol for aches pains and fever. If symptoms get worse patient should come in and be tested for the flu.

## 2016-10-14 NOTE — Patient Instructions (Signed)
Great to meet you!  Start tamiflu tonight  Use tylenol 2 pills 3-4 times daily, get plenty of fluids and rest.    Influenza, Adult Influenza, more commonly known as "the flu," is a viral infection that primarily affects the respiratory tract. The respiratory tract includes organs that help you breathe, such as the lungs, nose, and throat. The flu causes many common cold symptoms, as well as a high fever and body aches. The flu spreads easily from person to person (is contagious). Getting a flu shot (influenza vaccination) every year is the best way to prevent influenza. What are the causes? Influenza is caused by a virus. You can catch the virus by:  Breathing in droplets from an infected person's cough or sneeze.  Touching something that was recently contaminated with the virus and then touching your mouth, nose, or eyes. What increases the risk? The following factors may make you more likely to get the flu:  Not cleaning your hands frequently with soap and water or alcohol-based hand sanitizer.  Having close contact with many people during cold and flu season.  Touching your mouth, eyes, or nose without washing or sanitizing your hands first.  Not drinking enough fluids or not eating a healthy diet.  Not getting enough sleep or exercise.  Being under a high amount of stress.  Not getting a yearly (annual) flu shot. You may be at a higher risk of complications from the flu, such as a severe lung infection (pneumonia), if you:  Are over the age of 41.  Are pregnant.  Have a weakened disease-fighting system (immune system). You may have a weakened immune system if you:  Have HIV or AIDS.  Are undergoing chemotherapy.  Aretaking medicines that reduce the activity of (suppress) the immune system.  Have a long-term (chronic) illness, such as heart disease, kidney disease, diabetes, or lung disease.  Have a liver disorder.  Are obese.  Have anemia. What are the signs  or symptoms? Symptoms of this condition typically last 4-10 days and may include:  Fever.  Chills.  Headache, body aches, or muscle aches.  Sore throat.  Cough.  Runny or congested nose.  Chest discomfort and cough.  Poor appetite.  Weakness or tiredness (fatigue).  Dizziness.  Nausea or vomiting. How is this diagnosed? This condition may be diagnosed based on your medical history and a physical exam. Your health care provider may do a nose or throat swab test to confirm the diagnosis. How is this treated? If influenza is detected early, you can be treated with antiviral medicine that can reduce the length of your illness and the severity of your symptoms. This medicine may be given by mouth (orally) or through an IV tube that is inserted in one of your veins. The goal of treatment is to relieve symptoms by taking care of yourself at home. This may include taking over-the-counter medicines, drinking plenty of fluids, and adding humidity to the air in your home. In some cases, influenza goes away on its own. Severe influenza or complications from influenza may be treated in a hospital. Follow these instructions at home:  Take over-the-counter and prescription medicines only as told by your health care provider.  Use a cool mist humidifier to add humidity to the air in your home. This can make breathing easier.  Rest as needed.  Drink enough fluid to keep your urine clear or pale yellow.  Cover your mouth and nose when you cough or sneeze.  Wash your hands  with soap and water often, especially after you cough or sneeze. If soap and water are not available, use hand sanitizer.  Stay home from work or school as told by your health care provider. Unless you are visiting your health care provider, try to avoid leaving home until your fever has been gone for 24 hours without the use of medicine.  Keep all follow-up visits as told by your health care provider. This is  important. How is this prevented?  Getting an annual flu shot is the best way to avoid getting the flu. You may get the flu shot in late summer, fall, or winter. Ask your health care provider when you should get your flu shot.  Wash your hands often or use hand sanitizer often.  Avoid contact with people who are sick during cold and flu season.  Eat a healthy diet, drink plenty of fluids, get enough sleep, and exercise regularly. Contact a health care provider if:  You develop new symptoms.  You have:  Chest pain.  Diarrhea.  A fever.  Your cough gets worse.  You produce more mucus.  You feel nauseous or you vomit. Get help right away if:  You develop shortness of breath or difficulty breathing.  Your skin or nails turn a bluish color.  You have severe pain or stiffness in your neck.  You develop a sudden headache or sudden pain in your face or ear.  You cannot stop vomiting. This information is not intended to replace advice given to you by your health care provider. Make sure you discuss any questions you have with your health care provider. Document Released: 08/20/2000 Document Revised: 01/29/2016 Document Reviewed: 06/17/2015 Elsevier Interactive Patient Education  2017 [employer].

## 2016-10-15 ENCOUNTER — Ambulatory Visit: Payer: BLUE CROSS/BLUE SHIELD | Admitting: Family Medicine

## 2016-10-15 NOTE — Telephone Encounter (Signed)
error 

## 2016-12-02 ENCOUNTER — Telehealth: Payer: Self-pay

## 2016-12-02 NOTE — Telephone Encounter (Signed)
Please call and Benicar 20/12.5 and have him take 1 daily #30 refill as needed

## 2016-12-06 ENCOUNTER — Other Ambulatory Visit: Payer: Self-pay

## 2016-12-06 ENCOUNTER — Telehealth: Payer: Self-pay | Admitting: Family Medicine

## 2016-12-06 MED ORDER — OLMESARTAN MEDOXOMIL-HCTZ 20-12.5 MG PO TABS: 1.0000 | ORAL_TABLET | Freq: Every day | ORAL | 0 refills | Status: DC

## 2016-12-06 NOTE — Telephone Encounter (Signed)
Patient aware of the price [facility] informed me his prescription would be and that it was covered by insurance.

## 2016-12-27 ENCOUNTER — Other Ambulatory Visit: Payer: Self-pay | Admitting: Family Medicine

## 2017-02-21 ENCOUNTER — Ambulatory Visit: Payer: BLUE CROSS/BLUE SHIELD | Admitting: Family Medicine

## 2017-02-28 ENCOUNTER — Other Ambulatory Visit: Payer: Self-pay | Admitting: Family Medicine

## 2017-03-03 ENCOUNTER — Other Ambulatory Visit: Payer: Self-pay | Admitting: Family Medicine

## 2017-03-04 ENCOUNTER — Encounter: Payer: Self-pay | Admitting: Family Medicine

## 2017-03-04 ENCOUNTER — Ambulatory Visit (INDEPENDENT_AMBULATORY_CARE_PROVIDER_SITE_OTHER): Payer: BLUE CROSS/BLUE SHIELD | Admitting: Family Medicine

## 2017-03-04 VITALS — BP 124/86 | HR 119 | Temp 97.1°F | Ht 68.5 in | Wt 394.0 lb

## 2017-03-04 DIAGNOSIS — R7989 Other specified abnormal findings of blood chemistry: Secondary | ICD-10-CM

## 2017-03-04 DIAGNOSIS — H9202 Otalgia, left ear: Secondary | ICD-10-CM

## 2017-03-04 DIAGNOSIS — E7800 Pure hypercholesterolemia, unspecified: Secondary | ICD-10-CM | POA: Diagnosis not present

## 2017-03-04 DIAGNOSIS — E559 Vitamin D deficiency, unspecified: Secondary | ICD-10-CM

## 2017-03-04 DIAGNOSIS — R945 Abnormal results of liver function studies: Secondary | ICD-10-CM

## 2017-03-04 DIAGNOSIS — K625 Hemorrhage of anus and rectum: Secondary | ICD-10-CM | POA: Diagnosis not present

## 2017-03-04 DIAGNOSIS — I10 Essential (primary) hypertension: Secondary | ICD-10-CM | POA: Diagnosis not present

## 2017-03-04 DIAGNOSIS — F439 Reaction to severe stress, unspecified: Secondary | ICD-10-CM | POA: Diagnosis not present

## 2017-03-04 DIAGNOSIS — H6982 Other specified disorders of Eustachian tube, left ear: Secondary | ICD-10-CM

## 2017-03-04 DIAGNOSIS — R531 Weakness: Secondary | ICD-10-CM | POA: Diagnosis not present

## 2017-03-04 MED ORDER — FLUTICASONE PROPIONATE 50 MCG/ACT NA SUSP: 2.0000 | Freq: Every day | NASAL | 6 refills | Status: DC

## 2017-03-04 NOTE — Addendum Note (Signed)
Addended by: Magdalene RiverBULLINS,  H on: 03/04/2017 10:47 AM   Modules accepted: Orders

## 2017-03-04 NOTE — Addendum Note (Signed)
Addended by: Magdalene RiverBULLINS, Czarina Gingras H on: 03/04/2017 10:44 AM   Modules accepted: Orders
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
## 2017-03-04 NOTE — Patient Instructions (Addendum)
Continue current medications. Continue good therapeutic lifestyle changes which include good diet and exercise. Fall precautions discussed with patient. If an FOBT was given today- please return it to our front desk. If you are over 41 years old - you may need Prevnar 13 or the adult Pneumonia vaccine.  **Flu shots are available--- please call and schedule a FLU-CLINIC appointment**  After your visit with us today you will receive a survey in the mail or online from [employer] regarding your care with us. Please take a moment to fill this out. Your feedback is very important to us as you can help us better understand your patient needs as well as improve your experience and satisfaction. WE CARE ABOUT YOU!!!   Continue to work aggressively on weight loss through diet and exercise Watch sodium intake Decrease caffeine intake Try to wear support hose even in the summer if possible Follow-up with infectious disease as planned We'll call with results of CBC and liver function tests when they become available We will call in a prescription for Flonase and use 2 sprays each nostril daily at bedtime and continue with Zyrtec

## 2017-03-05 LAB — CBC WITH DIFFERENTIAL/PLATELET
BASOS ABS: 0.1 10*3/uL (ref 0.0–0.2)
Basos: 1 %
EOS (ABSOLUTE): 0.2 10*3/uL (ref 0.0–0.4)
Eos: 3 %
HEMOGLOBIN: 15.5 g/dL (ref 13.0–17.7)
Hematocrit: 45.7 % (ref 37.5–51.0)
IMMATURE GRANS (ABS): 0 10*3/uL (ref 0.0–0.1)
Immature Granulocytes: 0 %
LYMPHS ABS: 1.7 10*3/uL (ref 0.7–3.1)
LYMPHS: 33 %
MCH: 32.2 pg (ref 26.6–33.0)
MCHC: 33.9 g/dL (ref 31.5–35.7)
MCV: 95 fL (ref 79–97)
MONOCYTES: 13 %
Monocytes Absolute: 0.7 10*3/uL (ref 0.1–0.9)
NEUTROS ABS: 2.5 10*3/uL (ref 1.4–7.0)
Neutrophils: 50 %
PLATELETS: 283 10*3/uL (ref 150–379)
RBC: 4.81 x10E6/uL (ref 4.14–5.80)
RDW: 15.5 % — ABNORMAL HIGH (ref 12.3–15.4)
WBC: 5.1 10*3/uL (ref 3.4–10.8)

## 2017-03-05 LAB — HEPATIC FUNCTION PANEL
ALBUMIN: 3.9 g/dL (ref 3.5–5.5)
ALT: 80 IU/L — ABNORMAL HIGH (ref 0–44)
AST: 38 IU/L (ref 0–40)
Alkaline Phosphatase: 56 IU/L (ref 39–117)
Bilirubin Total: 0.3 mg/dL (ref 0.0–1.2)
Bilirubin, Direct: 0.12 mg/dL (ref 0.00–0.40)
TOTAL PROTEIN: 6 g/dL (ref 6.0–8.5)

## 2017-06-05 ENCOUNTER — Other Ambulatory Visit: Payer: Self-pay | Admitting: Family Medicine

## 2017-06-07 ENCOUNTER — Other Ambulatory Visit: Payer: Self-pay | Admitting: Family Medicine

## 2017-06-08 ENCOUNTER — Ambulatory Visit: Payer: 59 | Admitting: Infectious Diseases

## 2017-07-11 ENCOUNTER — Ambulatory Visit (INDEPENDENT_AMBULATORY_CARE_PROVIDER_SITE_OTHER): Payer: 59 | Admitting: Infectious Diseases

## 2017-07-11 ENCOUNTER — Encounter: Payer: Self-pay | Admitting: Infectious Diseases

## 2017-07-11 DIAGNOSIS — I10 Essential (primary) hypertension: Secondary | ICD-10-CM

## 2017-07-11 DIAGNOSIS — L03116 Cellulitis of left lower limb: Secondary | ICD-10-CM

## 2017-07-11 MED ORDER — PENICILLIN V POTASSIUM 500 MG PO TABS: 500.0000 mg | ORAL_TABLET | Freq: Every day | ORAL | 3 refills | Status: DC

## 2017-07-11 NOTE — Assessment & Plan Note (Signed)
He is doing well We discussed stopping/continuing PEN.  He wishes to continue on this.  Will continue compression stockings as well.  He will rtc in 1 year.

## 2017-07-11 NOTE — Assessment & Plan Note (Addendum)
He will recheck at f/u Left before we could repeat

## 2017-07-11 NOTE — Assessment & Plan Note (Signed)
Encouraged to continue his great progress.

## 2017-07-11 NOTE — Progress Notes (Signed)
   Subjective:    Patient ID: Corey Wilcox, male    DOB: 06/23/1976, 41 y.o.   MRN: [id number]  HPI 41 yo M with hx of morbid obesity (425#), fatty liver, who was admitted in Nov 2015 with sepsis and cellulitis.  Her returned 2-28 to 11-06-14 with the same. He was treated with vanco/levaquin due to a severe PEN allergy. He was tapered to vanco alone then to doxy. He was given this for 10 days then changed to low dose Clinda. His doppler was (-).  He was seen by allergy and had PEN challenge. Changed to PEN early 2017.   He has lost 24 pounds since February.  Has been doing well. Has been wearing compression socks daily.  Occas does not wear on w/e.  Continues to take Penicillin.  Has not noted erythema on his legs.   Review of Systems  Constitutional: Negative for chills, fever and unexpected weight change.  Gastrointestinal: Negative for constipation and diarrhea.  Genitourinary: Negative for difficulty urinating.  Psychiatric/Behavioral: Negative for dysphoric mood.  Please see HPI. All other systems reviewed and negative.      Objective:   Physical Exam  Constitutional: He appears well-developed and well-nourished.  Musculoskeletal:       Feet:          Assessment & Plan:

## 2017-09-01 ENCOUNTER — Other Ambulatory Visit: Payer: Self-pay | Admitting: Family Medicine

## 2017-09-21 ENCOUNTER — Ambulatory Visit: Payer: BLUE CROSS/BLUE SHIELD | Admitting: Family Medicine

## 2017-10-14 ENCOUNTER — Ambulatory Visit: Payer: BLUE CROSS/BLUE SHIELD | Admitting: Family Medicine

## 2017-11-25 ENCOUNTER — Ambulatory Visit: Payer: BLUE CROSS/BLUE SHIELD | Admitting: Family Medicine

## 2017-12-01 ENCOUNTER — Other Ambulatory Visit: Payer: Self-pay | Admitting: Family Medicine

## 2017-12-01 NOTE — Telephone Encounter (Signed)
Last seen 03/04/17  DWM

## 2017-12-04 ENCOUNTER — Other Ambulatory Visit: Payer: Self-pay | Admitting: Family Medicine

## 2017-12-14 ENCOUNTER — Ambulatory Visit (INDEPENDENT_AMBULATORY_CARE_PROVIDER_SITE_OTHER): Payer: 59

## 2017-12-14 ENCOUNTER — Ambulatory Visit (INDEPENDENT_AMBULATORY_CARE_PROVIDER_SITE_OTHER): Payer: 59 | Admitting: Family Medicine

## 2017-12-14 ENCOUNTER — Encounter: Payer: Self-pay | Admitting: Family Medicine

## 2017-12-14 VITALS — BP 130/82 | HR 101 | Temp 97.1°F | Ht 68.5 in | Wt 386.0 lb

## 2017-12-14 DIAGNOSIS — J301 Allergic rhinitis due to pollen: Secondary | ICD-10-CM

## 2017-12-14 DIAGNOSIS — Z0000 Encounter for general adult medical examination without abnormal findings: Secondary | ICD-10-CM | POA: Diagnosis not present

## 2017-12-14 DIAGNOSIS — E559 Vitamin D deficiency, unspecified: Secondary | ICD-10-CM

## 2017-12-14 DIAGNOSIS — I10 Essential (primary) hypertension: Secondary | ICD-10-CM | POA: Diagnosis not present

## 2017-12-14 DIAGNOSIS — E7800 Pure hypercholesterolemia, unspecified: Secondary | ICD-10-CM

## 2017-12-14 DIAGNOSIS — F439 Reaction to severe stress, unspecified: Secondary | ICD-10-CM

## 2017-12-14 LAB — URINALYSIS, COMPLETE
BILIRUBIN UA: NEGATIVE
Glucose, UA: NEGATIVE
LEUKOCYTES UA: NEGATIVE
Nitrite, UA: NEGATIVE
PH UA: 6 (ref 5.0–7.5)
Protein, UA: NEGATIVE
RBC UA: NEGATIVE
Specific Gravity, UA: 1.02 (ref 1.005–1.030)
UUROB: 0.2 mg/dL (ref 0.2–1.0)

## 2017-12-14 LAB — MICROSCOPIC EXAMINATION
Bacteria, UA: NONE SEEN
Epithelial Cells (non renal): NONE SEEN /hpf (ref 0–10)
RBC MICROSCOPIC, UA: NONE SEEN /HPF (ref 0–2)
Renal Epithel, UA: NONE SEEN /hpf
WBC UA: NONE SEEN /HPF (ref 0–5)

## 2017-12-14 MED ORDER — OLMESARTAN MEDOXOMIL-HCTZ 20-12.5 MG PO TABS: 1.0000 | ORAL_TABLET | Freq: Every day | ORAL | 3 refills | Status: DC

## 2017-12-14 MED ORDER — SIMVASTATIN 10 MG PO TABS: ORAL_TABLET | ORAL | 3 refills | Status: DC

## 2017-12-14 NOTE — Progress Notes (Signed)
Subjective:    Patient ID: Corey Wilcox, male    DOB: Jan 14, 1976, 42 y.o.   MRN: [id number]  HPI Pt here for Complete CPE  and management of chronic medical problems which includes hyperlipidemia and hypertension. He is taking medication regularly.  Patient today complains of some headaches which he thinks are sinus related.  He also has some low back pain and attributes this to sitting a lot at work.  He also complains of a knot on his right ankle.  His blood pressure today is elevated.  He is requesting refills on 2 of his medicines.  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  He does complain of some headaches especially around the left orbit and the left frontal sinus area.  He denies any increased congestion but does notice that the headaches seem to be worse with changes in the weather.  He also has some low back pain but says this is better with some changes in his chair that he sits in.  He complains with a knot on his right ankle below the malleolus.  He denies any chest pain or shortness of breath anymore than usual.  He still has occasional indigestion secondary to spicy foods and rich foods.  But this is no different than usual.  Otherwise there is no change in bowel habits with no nausea vomiting diarrhea or black tarry stools.  He does have occasional bright red blood in the stool and attributes this to the toilet paper at work.  He is passing his water without problems.  He sees Dr. Johnnye Sima, the infectious disease doctor once yearly and continues to wear support hose because of the cellulitis and takes penicillin prophylactically and regularly.  The family stress is diminished somewhat but still present and his daughter currently lives with his former wife.  He has not had an eye exam and knows that he needs to get this done.    Patient Active Problem List   Diagnosis Date Noted  .  Hydronephrosis, left 07/29/2014  . Hyperlipidemia 07/25/2014  . Cellulitis of leg, left 07/25/2014  . Abnormal urinalysis 07/25/2014  . Palpitations   . Hyperplasia of prostate   . Morbid obesity due to excess calories ([place])   . Fatty liver   . Essential hypertension, benign    Outpatient Encounter Medications as of 12/14/2017  Medication Sig  . cetirizine (ZYRTEC) 10 MG tablet Take 10 mg by mouth daily.    . cholecalciferol (VITAMIN D) 1000 UNITS tablet Take 2,000 Units by mouth daily.   Marland Kitchen olmesartan-hydrochlorothiazide (BENICAR HCT) 20-12.5 MG tablet TAKE 1 TABLET BY MOUTH EVERY DAY  . penicillin v potassium (VEETID) 500 MG tablet Take 1 tablet (500 mg total) daily by mouth. Maint. drug  . simvastatin (ZOCOR) 10 MG tablet TAKE 1 TABLET (10 MG TOTAL) BY MOUTH AT BEDTIME.  . [DISCONTINUED] fluticasone (FLONASE) 50 MCG/ACT nasal spray Place 2 sprays into both nostrils daily.   No facility-administered encounter medications on file as of 12/14/2017.       Review of Systems  Constitutional: Negative.   HENT: Positive for sinus pain (HA's ).   Eyes: Negative.   Respiratory: Negative.   Cardiovascular: Negative.   Gastrointestinal: Negative.   Endocrine: Negative.   Genitourinary: Negative.   Musculoskeletal: Positive for back pain (lower back - sits long time at work ).  Knot right ankle  Skin: Negative.   Allergic/Immunologic: Negative.   Neurological: Negative.   Hematological: Negative.   Psychiatric/Behavioral: Negative.        Objective:   Physical Exam  Constitutional: He is oriented to person, place, and time. He appears well-developed and well-nourished. No distress.  Pleasant and calm  HENT:  Head: Normocephalic and atraumatic.  Right Ear: External ear normal.  Left Ear: External ear normal.  Mouth/Throat: Oropharynx is clear and moist. No oropharyngeal exudate.  Nasal turbinate congestion bilaterally  Eyes: Pupils are equal, round, and reactive to light.  Conjunctivae and EOM are normal. Right eye exhibits no discharge. Left eye exhibits no discharge. No scleral icterus.  Patient understands the importance of getting his eyes examined as he is in need of an eye exam.  Neck: Normal range of motion. Neck supple. No thyromegaly present.  No bruits thyromegaly or anterior cervical adenopathy  Cardiovascular: Normal rate, regular rhythm, normal heart sounds and intact distal pulses.  No murmur heard. Heart is regular at 72/min  Pulmonary/Chest: Effort normal and breath sounds normal. No respiratory distress. He has no wheezes. He has no rales. He exhibits no tenderness.  Clear anteriorly and posteriorly and no axillary adenopathy or chest wall masses  Abdominal: Soft. Bowel sounds are normal. He exhibits no mass. There is no tenderness. There is no rebound and no guarding.  Morbid obesity.  No abdominal masses palpated and no bruits were noted on exam  Genitourinary: Rectum normal and penis normal.  Genitourinary Comments: The prostate was slightly enlarged without any lumps or masses.  The rectal exam was negative.  The external genitalia were within normal limits and no inguinal hernias were palpated.  Musculoskeletal: Normal range of motion. He exhibits no edema or tenderness.  There appears to be a firm nodule below the right lateral malleolus which I think would correspond to a ganglion type cyst.  There is no redness and is not causing any problems currently.  We will continue to monitor this.  Lymphadenopathy:    He has no cervical adenopathy.  Neurological: He is alert and oriented to person, place, and time. He has normal reflexes. No cranial nerve deficit.  Skin: Skin is warm and dry. No rash noted.  Psychiatric: He has a normal mood and affect. His behavior is normal. Judgment and thought content normal.  Nursing note and vitals reviewed.   BP (!) 142/99 (BP Location: Left Arm)   Pulse (!) 101   Temp (!) 97.1 F (36.2 C) (Oral)   Ht  5' 8.5" (1.74 m)   Wt (!) 386 lb (175.1 kg)   BMI 57.84 kg/m   Repeat blood pressure was 130/82 in the left arm supine with a thigh cuff.     Assessment & Plan:  1. Vitamin D deficiency -Continue with vitamin D replacement pending results of lab work - CBC with Differential/Platelet - VITAMIN D 25 Hydroxy (Vit-D Deficiency, Fractures)  2. Pure hypercholesterolemia -Continue with aggressive therapeutic lifestyle changes and simvastatin pending results of lab work - CBC with Differential/Platelet - Lipid panel - DG Chest 2 View; Future  3. Essential hypertension -Continue with current blood pressure treatment as second blood pressure with thigh cuff supine was good.  Watch sodium intake - CBC with Differential/Platelet - BMP8+EGFR - Hepatic function panel - DG Chest 2 View; Future  4. Morbid obesity due to excess calories ([place]) -We have scheduled multiple visits for this patient in the past for weight loss efforts and have not  been very successful.  He is encouraged again today to make efforts to lose weight that it will contribute to a lot of health conditions down the road if he does not. - CBC with Differential/Platelet  5. Annual physical exam -Chest x-ray will be done today and he will continue to follow-up with his infectious disease doctor and wear his support hose - CBC with Differential/Platelet - BMP8+EGFR - Lipid panel - VITAMIN D 25 Hydroxy (Vit-D Deficiency, Fractures) - PSA, total and free - Hepatic function panel - Urinalysis, Complete  6. Situational stress -This is somewhat better.  7. Seasonal allergic rhinitis due to pollen -Flonase 2 sprays each nostril once daily and take Claritin also if headache and congestion continue along with using nasal saline.  Meds ordered this encounter  Medications  . simvastatin (ZOCOR) 10 MG tablet    Sig: TAKE 1 TABLET (10 MG TOTAL) BY MOUTH AT BEDTIME.    Dispense:  90 tablet    Refill:  3    Put RF on file -  doesn't need right now  . olmesartan-hydrochlorothiazide (BENICAR HCT) 20-12.5 MG tablet    Sig: Take 1 tablet by mouth daily.    Dispense:  90 tablet    Refill:  3    Put RF on file - doesn't need right now   Patient Instructions  Continue current medications. Continue good therapeutic lifestyle changes which include good diet and exercise. Fall precautions discussed with patient. If an FOBT was given today- please return it to our front desk. If you are over 76 years old - you may need Prevnar 5 or the adult Pneumonia vaccine.  **Flu shots are available--- please call and schedule a FLU-CLINIC appointment**  After your visit with [place] today you will receive a survey in the mail or online from [employer] regarding your care with [place]. Please take a moment to fill this out. Your feedback is very important to [place] as you can help [place] better understand your patient needs as well as improve your experience and satisfaction. WE CARE ABOUT YOU!!!   Continue with regular follow-up yearly with infectious disease because of your history of cellulitis Continue with aggressive therapeutic lifestyle changes to achieve weight loss through diet and exercise Use Flonase regularly 2 sprays each nostril daily If the headaches continue add Claritin 1 daily to the regimen and also use nasal saline.   Arrie Senate MD

## 2017-12-14 NOTE — Patient Instructions (Addendum)
Continue current medications. Continue good therapeutic lifestyle changes which include good diet and exercise. Fall precautions discussed with patient. If an FOBT was given today- please return it to our front desk. If you are over 42 years old - you may need Prevnar 13 or the adult Pneumonia vaccine.  **Flu shots are available--- please call and schedule a FLU-CLINIC appointment**  After your visit with us today you will receive a survey in the mail or online from [employer] regarding your care with us. Please take a moment to fill this out. Your feedback is very important to us as you can help us better understand your patient needs as well as improve your experience and satisfaction. WE CARE ABOUT YOU!!!   Continue with regular follow-up yearly with infectious disease because of your history of cellulitis Continue with aggressive therapeutic lifestyle changes to achieve weight loss through diet and exercise Use Flonase regularly 2 sprays each nostril daily If the headaches continue add Claritin 1 daily to the regimen and also use nasal saline.

## 2017-12-15 LAB — HEPATIC FUNCTION PANEL
ALK PHOS: 56 IU/L (ref 39–117)
ALT: 36 IU/L (ref 0–44)
AST: 21 IU/L (ref 0–40)
Albumin: 3.9 g/dL (ref 3.5–5.5)
Bilirubin Total: 0.3 mg/dL (ref 0.0–1.2)
Bilirubin, Direct: 0.12 mg/dL (ref 0.00–0.40)
Total Protein: 5.9 g/dL — ABNORMAL LOW (ref 6.0–8.5)

## 2017-12-15 LAB — BMP8+EGFR
BUN / CREAT RATIO: 17 (ref 9–20)
BUN: 14 mg/dL (ref 6–24)
CALCIUM: 9.3 mg/dL (ref 8.7–10.2)
CHLORIDE: 103 mmol/L (ref 96–106)
CO2: 26 mmol/L (ref 20–29)
Creatinine, Ser: 0.82 mg/dL (ref 0.76–1.27)
GFR calc Af Amer: 127 mL/min/{1.73_m2} (ref >59)
GFR calc non Af Amer: 110 mL/min/{1.73_m2} (ref >59)
GLUCOSE: 88 mg/dL (ref 65–99)
Potassium: 4.2 mmol/L (ref 3.5–5.2)
Sodium: 142 mmol/L (ref 134–144)

## 2017-12-15 LAB — CBC WITH DIFFERENTIAL/PLATELET
Basophils Absolute: 0 10*3/uL (ref 0.0–0.2)
Basos: 1 %
EOS (ABSOLUTE): 0.1 10*3/uL (ref 0.0–0.4)
Eos: 2 %
Hematocrit: 45.9 % (ref 37.5–51.0)
Hemoglobin: 15.7 g/dL (ref 13.0–17.7)
IMMATURE GRANS (ABS): 0 10*3/uL (ref 0.0–0.1)
Immature Granulocytes: 0 %
LYMPHS: 27 %
Lymphocytes Absolute: 1.8 10*3/uL (ref 0.7–3.1)
MCH: 32.7 pg (ref 26.6–33.0)
MCHC: 34.2 g/dL (ref 31.5–35.7)
MCV: 96 fL (ref 79–97)
Monocytes Absolute: 1 10*3/uL — ABNORMAL HIGH (ref 0.1–0.9)
Monocytes: 15 %
NEUTROS PCT: 55 %
Neutrophils Absolute: 3.7 10*3/uL (ref 1.4–7.0)
PLATELETS: 307 10*3/uL (ref 150–379)
RBC: 4.8 x10E6/uL (ref 4.14–5.80)
RDW: 14.8 % (ref 12.3–15.4)
WBC: 6.6 10*3/uL (ref 3.4–10.8)

## 2017-12-15 LAB — LIPID PANEL
CHOLESTEROL TOTAL: 151 mg/dL (ref 100–199)
Chol/HDL Ratio: 3.3 ratio (ref 0.0–5.0)
HDL: 46 mg/dL (ref >39)
LDL Calculated: 85 mg/dL (ref 0–99)
Triglycerides: 98 mg/dL (ref 0–149)
VLDL CHOLESTEROL CAL: 20 mg/dL (ref 5–40)

## 2017-12-15 LAB — PSA, TOTAL AND FREE
PROSTATE SPECIFIC AG, SERUM: 0.3 ng/mL (ref 0.0–4.0)
PSA, Free Pct: 56.7 %
PSA, Free: 0.17 ng/mL

## 2017-12-15 LAB — VITAMIN D 25 HYDROXY (VIT D DEFICIENCY, FRACTURES): Vit D, 25-Hydroxy: 46.1 ng/mL (ref 30.0–100.0)

## 2018-06-21 ENCOUNTER — Ambulatory Visit: Payer: 59 | Admitting: Family Medicine

## 2018-07-18 ENCOUNTER — Encounter: Payer: Self-pay | Admitting: Infectious Diseases

## 2018-07-18 ENCOUNTER — Ambulatory Visit (INDEPENDENT_AMBULATORY_CARE_PROVIDER_SITE_OTHER): Payer: 59 | Admitting: Infectious Diseases

## 2018-07-18 DIAGNOSIS — L03116 Cellulitis of left lower limb: Secondary | ICD-10-CM

## 2018-07-18 MED ORDER — PENICILLIN V POTASSIUM 500 MG PO TABS: 500.0000 mg | ORAL_TABLET | Freq: Every day | ORAL | 3 refills | Status: DC

## 2018-07-18 NOTE — Progress Notes (Signed)
   Subjective:    Patient ID: Corey Wilcox, male    DOB: 09/29/1975, 42 y.o.   MRN: [id number]  HPI 42yo M with hx of morbid obesity (425#), fatty liver, who was admitted in Nov 2015 with sepsis and cellulitis.  Her returned 2-28 to 11-06-14 with the same. He was treated with vanco/levaquin due to a severe PEN allergy. He was tapered to vanco alone then to doxy. He was given this for 10 days then changed to low dose Clinda. His doppler was (-).  He was seen by allergy and had PEN challenge. Changed to PEN prophylaxis 2017. He is also wearing compression stockings.  Wt is down 40# since 12-2015. Feels like he is eating less, not snacking as much. Not eating out as much. Not exercising. No formal wt loss plan.   Has been wearing his compression socks (except on w/e). 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  No problems with PEN.   Review of Systems  Constitutional: Negative for appetite change, chills, fever and unexpected weight change.  Cardiovascular: Positive for leg swelling.  Gastrointestinal: Negative for constipation and diarrhea.  Genitourinary: Negative for difficulty urinating.  Please see HPI. All other systems reviewed and negative.     Objective:   Physical Exam  Constitutional: He is oriented to person, place, and time. He appears well-developed and well-nourished.  Musculoskeletal:       Legs: Neurological: He is alert and oriented to person, place, and time.          Assessment & Plan:

## 2018-07-18 NOTE — Assessment & Plan Note (Addendum)
Encouraged pt to lose wt.  Offered to have him seen by nutrition, consider weight watchers f/u.  After conferring with his mother, he will consider.

## 2018-07-18 NOTE — Assessment & Plan Note (Signed)
He is doing well He and his mom are weighing wether to continue the PEN.  They would like to stay on the PEN in an effort to stay out of the hospital.  He will continue to wear compression stockings.  Continue to work on wt loss.  rtc in 1 year.

## 2018-10-10 ENCOUNTER — Ambulatory Visit: Payer: 59 | Admitting: Family Medicine

## 2018-10-10 ENCOUNTER — Ambulatory Visit (INDEPENDENT_AMBULATORY_CARE_PROVIDER_SITE_OTHER): Payer: 59 | Admitting: Family Medicine

## 2018-10-10 ENCOUNTER — Encounter: Payer: Self-pay | Admitting: Family Medicine

## 2018-10-10 VITALS — BP 126/84 | HR 89 | Temp 97.6°F | Ht 68.5 in | Wt 386.0 lb

## 2018-10-10 DIAGNOSIS — F332 Major depressive disorder, recurrent severe without psychotic features: Secondary | ICD-10-CM | POA: Diagnosis not present

## 2018-10-10 MED ORDER — DULOXETINE HCL 60 MG PO CPEP: 60.0000 mg | ORAL_CAPSULE | Freq: Every day | ORAL | 0 refills | Status: DC
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
## 2018-10-20 ENCOUNTER — Ambulatory Visit (INDEPENDENT_AMBULATORY_CARE_PROVIDER_SITE_OTHER): Payer: 59 | Admitting: Family Medicine

## 2018-10-20 ENCOUNTER — Encounter: Payer: Self-pay | Admitting: Family Medicine

## 2018-10-20 VITALS — BP 128/84 | HR 117 | Temp 97.9°F | Ht 68.5 in | Wt 385.5 lb

## 2018-10-20 DIAGNOSIS — F332 Major depressive disorder, recurrent severe without psychotic features: Secondary | ICD-10-CM | POA: Diagnosis not present

## 2018-10-20 NOTE — Progress Notes (Signed)
Subjective:  Patient ID: Corey Wilcox, male    DOB: [birth year]  Age: 43 y.o. MRN: [id number]  CC: Depression   HPI Corey Wilcox presents for continued symptoms of severe depression.  He continues to feel sleepy all the time.  No energy and feeling tired.  Has very poor appetite although at times he gets hungry and then he will not be hungry for an extended period of time after eating.  He continues to feel very much of failure and is having trouble concentrating.  PHQ score noted below.  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  He has been told that he needs weekly sessions before he will resolve current issues.  Depression screen [facility] 2/9 10/20/2018 10/10/2018 10/10/2018  Decreased Interest 2 3 0  Down, Depressed, Hopeless 2 3 0  PHQ - 2 Score 4 6 0  Altered sleeping 2 2 -  Tired, decreased energy 3 3 -  Change in appetite 2 1 -  Feeling bad or failure about yourself  3 3 -  Trouble concentrating 2 3 -  Moving slowly or fidgety/restless 2 2 -  Suicidal thoughts 0 0 -  PHQ-9 Score 18 20 -  Difficult doing work/chores - Somewhat difficult -    History Corey Wilcox has a past medical history of Essential hypertension, benign, Fatty liver, Hyperplasia of prostate, Obesity, Other and unspecified hyperlipidemia, and Palpitations.   He has a past surgical history that includes tympanoplasty.   His family history includes Atrial fibrillation in his father; Stroke in his father.He reports that he has quit smoking. His smoking use included cigarettes. He has never used smokeless tobacco. He reports current alcohol use. He reports that he does not use drugs.    ROS Review of Systems  Constitutional: Negative for fever.  Respiratory: Negative for shortness of breath.   Cardiovascular: Negative for chest pain.  Musculoskeletal: Negative for arthralgias.  Skin: Negative for rash.  Psychiatric/Behavioral: Positive for decreased concentration,  dysphoric mood and sleep disturbance. Negative for self-injury and suicidal ideas. The patient is nervous/anxious.     Objective:  BP 128/84   Pulse (!) 117   Temp 97.9 F (36.6 C) (Oral)   Ht 5' 8.5" (1.74 m)   Wt (!) 385 lb 8 oz (174.9 kg)   BMI 57.76 kg/m   BP Readings from Last 3 Encounters:  10/20/18 128/84  10/10/18 126/84  07/18/18 (!) 148/76    Wt Readings from Last 3 Encounters:  10/20/18 (!) 385 lb 8 oz (174.9 kg)  10/10/18 (!) 386 lb (175.1 kg)  07/18/18 (!) 390 lb (176.9 kg)     Physical Exam Vitals signs reviewed.  Constitutional:      General: He is in acute distress (tearful, distraught).     Appearance: He is well-developed.  HENT:     Head: Normocephalic and atraumatic.     Right Ear: External ear normal.     Left Ear: External ear normal.     Mouth/Throat:     Pharynx: No oropharyngeal exudate or posterior oropharyngeal erythema.  Eyes:     Pupils: Pupils are equal, round, and reactive to light.  Neck:     Musculoskeletal: Normal range of motion and neck supple.  Cardiovascular:     Rate and Rhythm: Normal rate and regular rhythm.     Heart sounds: No murmur.  Pulmonary:     Effort: No respiratory distress.     Breath sounds: Normal breath sounds.  Neurological:     Mental Status: He is alert and oriented to person, place, and time.  Psychiatric:        Mood and Affect: Mood is depressed. Affect is blunt and tearful.        Speech: Speech is delayed.        Behavior: Behavior is slowed and withdrawn.        Thought Content: Thought content is paranoid.        Cognition and Memory: Cognition normal.       Assessment & Plan:   Al[place] was seen today for depression.  Diagnoses and all orders for this visit:  Severe episode of recurrent major depressive disorder, without psychotic features (HCC)       I am having Corey Wilcox maintain his cetirizine, cholecalciferol, simvastatin, olmesartan-hydrochlorothiazide, penicillin v  potassium, and DULoxetine.  Allergies as of 10/20/2018      Reactions   Cephalexin Nausea And Vomiting      Medication List       Accurate as of October 20, 2018  5:51 PM. Always use your most recent med list.        cetirizine 10 MG tablet Commonly known as:  ZYRTEC Take 10 mg by mouth daily.   cholecalciferol 1000 units tablet Commonly known as:  VITAMIN D Take 2,000 Units by mouth daily.   DULoxetine 60 MG capsule Commonly known as:  CYMBALTA Take 1 capsule (60 mg total) by mouth daily.   olmesartan-hydrochlorothiazide 20-12.5 MG tablet Commonly known as:  BENICAR HCT Take 1 tablet by mouth daily.   penicillin v potassium 500 MG tablet Commonly known as:  VEETID Take 1 tablet (500 mg total) by mouth daily. Maint. drug   simvastatin 10 MG tablet Commonly known as:  ZOCOR TAKE 1 TABLET (10 MG TOTAL) BY MOUTH AT BEDTIME.      His weekly sessions multiplied by a minimum of 8 needed to resolve his situation leaves me with a new estimated return to work date of December 18, 2018  Follow-up: Return in about 2 weeks (around 11/03/2018).  Mechele Claude, M.D.

## 2018-10-23 DIAGNOSIS — Z0289 Encounter for other administrative examinations: Secondary | ICD-10-CM

## 2018-10-30 ENCOUNTER — Other Ambulatory Visit: Payer: Self-pay | Admitting: Family Medicine

## 2018-11-03 ENCOUNTER — Ambulatory Visit (INDEPENDENT_AMBULATORY_CARE_PROVIDER_SITE_OTHER): Payer: 59 | Admitting: Family Medicine

## 2018-11-03 ENCOUNTER — Encounter: Payer: Self-pay | Admitting: Family Medicine

## 2018-11-03 VITALS — BP 127/77 | HR 109 | Temp 97.3°F | Ht 68.5 in | Wt 393.2 lb

## 2018-11-03 DIAGNOSIS — F332 Major depressive disorder, recurrent severe without psychotic features: Secondary | ICD-10-CM

## 2018-11-03 MED ORDER — DULOXETINE HCL 60 MG PO CPEP: 60.0000 mg | ORAL_CAPSULE | Freq: Two times a day (BID) | ORAL | 1 refills | Status: DC

## 2018-11-03 NOTE — Progress Notes (Signed)
Subjective:  Patient ID: Corey Wilcox, male    DOB: [birth year]  Age: 43 y.o. MRN: [id number]  CC: Depression   HPI Corey Wilcox presents for continued symptoms of depression noted below.  Primarily he is sad withdrawn generally unhappy.  He is not sleeping well he has no energy.  He is having trouble concentrating and getting trouble at work.  He is still upset of course about his daughters unwed pregnancy.  Depression screen [facility] 2/9 11/03/2018 10/20/2018 10/10/2018  Decreased Interest 2 2 3   Down, Depressed, Hopeless 2 2 3   PHQ - 2 Score 4 4 6   Altered sleeping 2 2 2   Tired, decreased energy 2 3 3   Change in appetite 0 2 1  Feeling bad or failure about yourself  2 3 3   Trouble concentrating 1 2 3   Moving slowly or fidgety/restless 2 2 2   Suicidal thoughts 0 0 0  PHQ-9 Score 13 18 20   Difficult doing work/chores Somewhat difficult - Somewhat difficult    History Corey Wilcox has a past medical history of Essential hypertension, benign, Fatty liver, Hyperplasia of prostate, Obesity, Other and unspecified hyperlipidemia, and Palpitations.   He has a past surgical history that includes tympanoplasty.   His family history includes Atrial fibrillation in his father; Stroke in his father.He reports that he has quit smoking. His smoking use included cigarettes. He has never used smokeless tobacco. He reports current alcohol use. He reports that he does not use drugs.    ROS Review of Systems  Constitutional: Negative for fever.  Respiratory: Negative for shortness of breath.   Cardiovascular: Negative for chest pain.  Musculoskeletal: Negative for arthralgias.  Skin: Negative for rash.    Objective:  BP 127/77   Pulse (!) 109   Temp (!) 97.3 F (36.3 C) (Oral)   Ht 5' 8.5" (1.74 m)   Wt (!) 393 lb 4 oz (178.4 kg)   BMI 58.92 kg/m   BP Readings from Last 3 Encounters:  11/03/18 127/77  10/20/18 128/84  10/10/18 126/84    Wt Readings from Last 3 Encounters:  11/03/18 (!)  393 lb 4 oz (178.4 kg)  10/20/18 (!) 385 lb 8 oz (174.9 kg)  10/10/18 (!) 386 lb (175.1 kg)     Physical Exam Vitals signs reviewed.  Constitutional:      Appearance: He is well-developed.  HENT:     Head: Normocephalic and atraumatic.     Right Ear: External ear normal.     Left Ear: External ear normal.     Mouth/Throat:     Pharynx: No oropharyngeal exudate or posterior oropharyngeal erythema.  Eyes:     Pupils: Pupils are equal, round, and reactive to light.  Neck:     Musculoskeletal: Normal range of motion and neck supple.  Cardiovascular:     Rate and Rhythm: Normal rate and regular rhythm.     Heart sounds: No murmur.  Pulmonary:     Effort: No respiratory distress.     Breath sounds: Normal breath sounds.  Neurological:     Mental Status: He is alert and oriented to person, place, and time.  Psychiatric:        Attention and Perception: Perception normal.        Mood and Affect: Mood is depressed. Affect is blunt.        Speech: Speech normal.        Behavior: Behavior is cooperative.        Thought Content: Thought content normal.  Cognition and Memory: Cognition normal.       Assessment & Plan:   Corey Wilcox was seen today for depression.  Diagnoses and all orders for this visit:  Severe episode of recurrent major depressive disorder, without psychotic features (HCC)  Other orders -     DULoxetine (CYMBALTA) 60 MG capsule; Take 1 capsule (60 mg total) by mouth 2 (two) times daily.       I have changed Corey Wilcox's DULoxetine. I am also having him maintain his cetirizine, cholecalciferol, simvastatin, olmesartan-hydrochlorothiazide, and penicillin v potassium.  Allergies as of 11/03/2018      Reactions   Cephalexin Nausea And Vomiting      Medication List       Accurate as of November 03, 2018 11:59 PM. Always use your most recent med list.        cetirizine 10 MG tablet Commonly known as:  ZYRTEC Take 10 mg by mouth daily.     cholecalciferol 1000 units tablet Commonly known as:  VITAMIN D Take 2,000 Units by mouth daily.   DULoxetine 60 MG capsule Commonly known as:  CYMBALTA Take 1 capsule (60 mg total) by mouth 2 (two) times daily.   olmesartan-hydrochlorothiazide 20-12.5 MG tablet Commonly known as:  BENICAR HCT Take 1 tablet by mouth daily.   penicillin v potassium 500 MG tablet Commonly known as:  VEETID Take 1 tablet (500 mg total) by mouth daily. Maint. drug   simvastatin 10 MG tablet Commonly known as:  ZOCOR TAKE 1 TABLET (10 MG TOTAL) BY MOUTH AT BEDTIME.        Follow-up: No follow-ups on file.  Mechele Claude, M.D.

## 2018-11-07 ENCOUNTER — Encounter: Payer: Self-pay | Admitting: Family Medicine

## 2018-11-07 DIAGNOSIS — F332 Major depressive disorder, recurrent severe without psychotic features: Secondary | ICD-10-CM | POA: Insufficient documentation

## 2018-11-17 ENCOUNTER — Ambulatory Visit (INDEPENDENT_AMBULATORY_CARE_PROVIDER_SITE_OTHER): Payer: 59 | Admitting: Family Medicine

## 2018-11-17 ENCOUNTER — Encounter: Payer: Self-pay | Admitting: Family Medicine

## 2018-11-17 ENCOUNTER — Other Ambulatory Visit: Payer: Self-pay

## 2018-11-17 VITALS — BP 124/82 | HR 93 | Temp 97.9°F | Ht 68.5 in | Wt 391.5 lb

## 2018-11-17 DIAGNOSIS — F332 Major depressive disorder, recurrent severe without psychotic features: Secondary | ICD-10-CM | POA: Diagnosis not present

## 2018-11-17 NOTE — Progress Notes (Signed)
Subjective:  Patient ID: Corey Wilcox, male    DOB: December 25, 1975  Age: 43 y.o. MRN: [id number]  CC: Medical Management of Chronic Issues (pt here today for 2 week follow up after increasing Cymbalta to 60mg  twice daily)   HPI Corey Wilcox presents for recheck of his depression he says he has about half good days have bad days.  Depression screen [facility] 2/9 11/17/2018 11/03/2018 10/20/2018  Decreased Interest 2 2 2   Down, Depressed, Hopeless 2 2 2   PHQ - 2 Score 4 4 4   Altered sleeping 2 2 2   Tired, decreased energy 2 2 3   Change in appetite 1 0 2  Feeling bad or failure about yourself  2 2 3   Trouble concentrating 2 1 2   Moving slowly or fidgety/restless 2 2 2   Suicidal thoughts 0 0 0  PHQ-9 Score 15 13 18   Difficult doing work/chores Somewhat difficult Somewhat difficult -  Some recent data might be hidden    History Corey Wilcox has a past medical history of Essential hypertension, benign, Fatty liver, Hyperplasia of prostate, Obesity, Other and unspecified hyperlipidemia, and Palpitations.   He has a past surgical history that includes tympanoplasty.   His family history includes Atrial fibrillation in his father; Stroke in his father.He reports that he has quit smoking. His smoking use included cigarettes. He has never used smokeless tobacco. He reports current alcohol use. He reports that he does not use drugs.    ROS Review of Systems  Constitutional: Negative for fever.  Respiratory: Negative for shortness of breath.   Cardiovascular: Negative for chest pain.  Musculoskeletal: Negative for arthralgias.  Skin: Negative for rash.    Objective:  BP 124/82   Pulse 93   Temp 97.9 F (36.6 C) (Oral)   Ht 5' 8.5" (1.74 m)   Wt (!) 391 lb 8 oz (177.6 kg)   BMI 58.66 kg/m   BP Readings from Last 3 Encounters:  11/17/18 124/82  11/03/18 127/77  10/20/18 128/84    Wt Readings from Last 3 Encounters:  11/17/18 (!) 391 lb 8 oz (177.6 kg)  11/03/18 (!) 393 lb 4 oz (178.4  kg)  10/20/18 (!) 385 lb 8 oz (174.9 kg)     Physical Exam Vitals signs reviewed.  Constitutional:      Appearance: He is well-developed.  HENT:     Head: Normocephalic and atraumatic.     Right Ear: External ear normal.     Left Ear: External ear normal.     Mouth/Throat:     Pharynx: No oropharyngeal exudate or posterior oropharyngeal erythema.  Eyes:     Pupils: Pupils are equal, round, and reactive to light.  Neck:     Musculoskeletal: Normal range of motion and neck supple.  Cardiovascular:     Rate and Rhythm: Normal rate and regular rhythm.     Heart sounds: No murmur.  Pulmonary:     Effort: No respiratory distress.     Breath sounds: Normal breath sounds.  Neurological:     Mental Status: He is alert and oriented to person, place, and time.       Assessment & Plan:   Corey Wilcox was seen today for medical management of chronic issues.  Diagnoses and all orders for this visit:  Severe episode of recurrent major depressive disorder, without psychotic features [facility]) -     Ambulatory referral to Psychiatry       I am having Corey Wilcox maintain his cetirizine, cholecalciferol, simvastatin, olmesartan-hydrochlorothiazide, penicillin v  potassium, and DULoxetine.  Allergies as of 11/17/2018      Reactions   Cephalexin Nausea And Vomiting      Medication List       Accurate as of November 17, 2018 10:27 PM. Always use your most recent med list.        cetirizine 10 MG tablet Commonly known as:  ZYRTEC Take 10 mg by mouth daily.   cholecalciferol 1000 units tablet Commonly known as:  VITAMIN D Take 2,000 Units by mouth daily.   DULoxetine 60 MG capsule Commonly known as:  CYMBALTA Take 1 capsule (60 mg total) by mouth 2 (two) times daily.   olmesartan-hydrochlorothiazide 20-12.5 MG tablet Commonly known as:  BENICAR HCT Take 1 tablet by mouth daily.   penicillin v potassium 500 MG tablet Commonly known as:  VEETID Take 1 tablet (500 mg total) by  mouth daily. Maint. drug   simvastatin 10 MG tablet Commonly known as:  ZOCOR TAKE 1 TABLET (10 MG TOTAL) BY MOUTH AT BEDTIME.        Follow-up: Return in about 1 month (around 12/18/2018).  Mechele Claude, M.D.

## 2018-11-20 ENCOUNTER — Other Ambulatory Visit: Payer: Self-pay | Admitting: Family Medicine

## 2018-11-24 ENCOUNTER — Other Ambulatory Visit: Payer: Self-pay | Admitting: Family Medicine
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
## 2018-12-07 ENCOUNTER — Ambulatory Visit (INDEPENDENT_AMBULATORY_CARE_PROVIDER_SITE_OTHER): Payer: 59 | Admitting: Psychiatry

## 2018-12-07 ENCOUNTER — Other Ambulatory Visit: Payer: Self-pay

## 2018-12-07 ENCOUNTER — Encounter (HOSPITAL_COMMUNITY): Payer: Self-pay | Admitting: Psychiatry

## 2018-12-07 DIAGNOSIS — F331 Major depressive disorder, recurrent, moderate: Secondary | ICD-10-CM | POA: Diagnosis not present

## 2018-12-07 MED ORDER — BUPROPION HCL ER (XL) 150 MG PO TB24: 150.0000 mg | ORAL_TABLET | Freq: Every day | ORAL | 0 refills | Status: DC

## 2018-12-07 MED ORDER — DULOXETINE HCL 60 MG PO CPEP: 120.0000 mg | ORAL_CAPSULE | Freq: Every day | ORAL | 0 refills | Status: DC

## 2018-12-07 NOTE — Patient Instructions (Addendum)
1. Reinitiate duloxetine 120 mg daily  2. Start bupropion 150 mg daily  3. Return to clinic on  4/30 at 11:30 for 30 mins 4. Expected return to work date: 6/1
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
## 2019-01-04 ENCOUNTER — Ambulatory Visit (INDEPENDENT_AMBULATORY_CARE_PROVIDER_SITE_OTHER): Payer: 59 | Admitting: Psychiatry

## 2019-01-04 ENCOUNTER — Encounter (HOSPITAL_COMMUNITY): Payer: Self-pay | Admitting: Psychiatry

## 2019-01-04 ENCOUNTER — Other Ambulatory Visit: Payer: Self-pay

## 2019-01-04 DIAGNOSIS — F331 Major depressive disorder, recurrent, moderate: Secondary | ICD-10-CM

## 2019-01-04 MED ORDER — BUPROPION HCL ER (XL) 300 MG PO TB24: 300.0000 mg | ORAL_TABLET | Freq: Every day | ORAL | 0 refills | Status: DC

## 2019-01-04 NOTE — Patient Instructions (Addendum)
1. Continue duloxetine 120 mg daily  2. Increase bupropion 300 mg daily  3. Next appointment 5/28 at 8:30 for 30 mins

## 2019-01-15 ENCOUNTER — Other Ambulatory Visit (HOSPITAL_COMMUNITY): Payer: Self-pay | Admitting: Psychiatry

## 2019-01-15 MED ORDER — DULOXETINE HCL 60 MG PO CPEP: 120.0000 mg | ORAL_CAPSULE | Freq: Every day | ORAL | 0 refills | Status: DC
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
## 2019-02-01 ENCOUNTER — Ambulatory Visit (HOSPITAL_COMMUNITY): Payer: 59 | Admitting: Psychiatry

## 2019-02-01 ENCOUNTER — Encounter (HOSPITAL_COMMUNITY): Payer: Self-pay | Admitting: Psychiatry

## 2019-02-01 ENCOUNTER — Other Ambulatory Visit: Payer: Self-pay

## 2019-02-01 ENCOUNTER — Ambulatory Visit (INDEPENDENT_AMBULATORY_CARE_PROVIDER_SITE_OTHER): Payer: 59 | Admitting: Psychiatry

## 2019-02-01 ENCOUNTER — Telehealth (HOSPITAL_COMMUNITY): Payer: Self-pay | Admitting: Psychiatry

## 2019-02-01 DIAGNOSIS — F331 Major depressive disorder, recurrent, moderate: Secondary | ICD-10-CM

## 2019-02-01 MED ORDER — BUPROPION HCL ER (XL) 300 MG PO TB24: 300.0000 mg | ORAL_TABLET | Freq: Every day | ORAL | 0 refills | Status: DC

## 2019-02-01 NOTE — Telephone Encounter (Signed)
Spoke with Rx he has 2 scripts sent 1) says 1 daily  & the the other says 2 x daily.  I followed up with the assessment & plan note to reinitiate 120 mg . Rx processed & will be filling Rx. Notifying patient

## 2019-02-01 NOTE — Patient Instructions (Addendum)
1. Reinitiate duloxetine 120 mg daily  2. Continue bupropion 300 mg daily  3. Next appointment: 6/26 at 11:20 4. PHP will contact you for more information

## 2019-02-01 NOTE — Telephone Encounter (Signed)
He states that the pharmacy did not dispense duloxetine due to some insurance issues. Could you contact them what the issues are (he has been taking this medication for a while)?

## 2019-02-06 ENCOUNTER — Telehealth (HOSPITAL_COMMUNITY): Payer: Self-pay | Admitting: Professional

## 2019-02-07 ENCOUNTER — Telehealth (HOSPITAL_COMMUNITY): Payer: Self-pay | Admitting: Professional

## 2019-02-18 ENCOUNTER — Other Ambulatory Visit: Payer: Self-pay | Admitting: Family Medicine
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
## 2019-03-02 ENCOUNTER — Encounter (HOSPITAL_COMMUNITY): Payer: Self-pay | Admitting: Psychiatry

## 2019-03-02 ENCOUNTER — Ambulatory Visit (INDEPENDENT_AMBULATORY_CARE_PROVIDER_SITE_OTHER): Payer: 59 | Admitting: Psychiatry

## 2019-03-02 ENCOUNTER — Other Ambulatory Visit: Payer: Self-pay

## 2019-03-02 DIAGNOSIS — F331 Major depressive disorder, recurrent, moderate: Secondary | ICD-10-CM | POA: Diagnosis not present

## 2019-03-02 NOTE — Patient Instructions (Signed)
1. Continue duloxetine 120 mg daily  2.Continuebupropion 300 mg daily 3. Next appointment: 8/31 at 11 AM

## 2019-03-07 ENCOUNTER — Encounter: Payer: Self-pay | Admitting: Family

## 2019-03-07 ENCOUNTER — Other Ambulatory Visit: Payer: Self-pay

## 2019-03-07 ENCOUNTER — Ambulatory Visit (INDEPENDENT_AMBULATORY_CARE_PROVIDER_SITE_OTHER): Payer: 59 | Admitting: Family

## 2019-03-07 DIAGNOSIS — M545 Low back pain, unspecified: Secondary | ICD-10-CM

## 2019-03-07 DIAGNOSIS — R109 Unspecified abdominal pain: Secondary | ICD-10-CM | POA: Diagnosis not present

## 2019-03-07 DIAGNOSIS — N200 Calculus of kidney: Secondary | ICD-10-CM

## 2019-03-07 DIAGNOSIS — R319 Hematuria, unspecified: Secondary | ICD-10-CM

## 2019-03-07 LAB — MICROSCOPIC EXAMINATION
Bacteria, UA: NONE SEEN
RBC: >30 /hpf — AB (ref 0–2)
Renal Epithel, UA: NONE SEEN /hpf

## 2019-03-07 LAB — URINALYSIS, COMPLETE
Bilirubin, UA: NEGATIVE
Glucose, UA: NEGATIVE
Leukocytes,UA: NEGATIVE
Nitrite, UA: NEGATIVE
Specific Gravity, UA: >1.03 — ABNORMAL HIGH (ref 1.005–1.030)
Urobilinogen, Ur: 0.2 mg/dL (ref 0.2–1.0)
pH, UA: 5.5 (ref 5.0–7.5)

## 2019-03-07 MED ORDER — TAMSULOSIN HCL 0.4 MG PO CAPS: 0.4000 mg | ORAL_CAPSULE | Freq: Every day | ORAL | 3 refills | Status: DC

## 2019-03-07 MED ORDER — ONDANSETRON HCL 4 MG PO TABS: 4.0000 mg | ORAL_TABLET | Freq: Three times a day (TID) | ORAL | 0 refills | Status: DC | PRN

## 2019-03-07 MED ORDER — HYDROCODONE-ACETAMINOPHEN 5-325 MG PO TABS: 1.0000 | ORAL_TABLET | Freq: Four times a day (QID) | ORAL | 0 refills | Status: DC | PRN

## 2019-03-07 NOTE — Progress Notes (Signed)
Virtual Visit via telephone Note  I connected with Corey Wilcox on 03/07/19 at 8:30 AM  by telephone and verified that I am speaking with the correct person using two identifiers. Corey Wilcox is currently located at home and mother is currently with her during visit. The provider, Evelina Dun, FNP is located in their office at time of visit.  I discussed the limitations, risks, security and privacy concerns of performing an evaluation and management service by telephone and the availability of in person appointments. I also discussed with the patient that there may be a patient responsible charge related to this service. The patient expressed understanding and agreed to proceed.   History and Present Illness:  PT calls today with back pain that radiates to his left flank that started during the night. States he has a hx kidney stones. However, he states he has tried to have a BM and can not and feels his pain would improve if he could. He is also reporting belching.  Back Pain This is a new problem. The current episode started yesterday. The problem occurs constantly. The problem has been gradually worsening since onset. The pain is present in the lumbar spine. The quality of the pain is described as aching. Radiates to: left flank. The pain is at a severity of 8/10. The pain is moderate. Associated symptoms include abdominal pain. Pertinent negatives include no bladder incontinence, bowel incontinence or dysuria. He has tried bed rest for the symptoms. The treatment provided mild relief.      Review of Systems  Gastrointestinal: Positive for abdominal pain. Negative for bowel incontinence.  Genitourinary: Negative for bladder incontinence and dysuria.  Musculoskeletal: Positive for back pain.  All other systems reviewed and are negative.    Observations/Objective: No SOB or distress noted  Brought patient in office, on exam no tenderness in abdomen noted. Large amount of  blood in urine.   Assessment and Plan: 1. Acute bilateral low back pain, unspecified whether sciatica present - Urinalysis, Complete - tamsulosin (FLOMAX) 0.4 MG CAPS capsule; Take 1 capsule (0.4 mg total) by mouth daily.  Dispense: 30 capsule; Refill: 3 - HYDROcodone-acetaminophen (NORCO) 5-325 MG tablet; Take 1 tablet by mouth every 6 (six) hours as needed for moderate pain.  Dispense: 30 tablet; Refill: 0 - ondansetron (ZOFRAN) 4 MG tablet; Take 1 tablet (4 mg total) by mouth every 8 (eight) hours as needed for nausea or vomiting.  Dispense: 20 tablet; Refill: 0 - Ambulatory referral to Urology  2. Flank pain - Urinalysis, Complete - tamsulosin (FLOMAX) 0.4 MG CAPS capsule; Take 1 capsule (0.4 mg total) by mouth daily.  Dispense: 30 capsule; Refill: 3 - HYDROcodone-acetaminophen (NORCO) 5-325 MG tablet; Take 1 tablet by mouth every 6 (six) hours as needed for moderate pain.  Dispense: 30 tablet; Refill: 0 - ondansetron (ZOFRAN) 4 MG tablet; Take 1 tablet (4 mg total) by mouth every 8 (eight) hours as needed for nausea or vomiting.  Dispense: 20 tablet; Refill: 0 - Ambulatory referral to Urology  3. Hematuria, unspecified type - tamsulosin (FLOMAX) 0.4 MG CAPS capsule; Take 1 capsule (0.4 mg total) by mouth daily.  Dispense: 30 capsule; Refill: 3 - HYDROcodone-acetaminophen (NORCO) 5-325 MG tablet; Take 1 tablet by mouth every 6 (six) hours as needed for moderate pain.  Dispense: 30 tablet; Refill: 0 - ondansetron (ZOFRAN) 4 MG tablet; Take 1 tablet (4 mg total) by mouth every 8 (eight) hours as needed for nausea or vomiting.  Dispense: 20 tablet; Refill: 0 -  Ambulatory referral to Urology  4. Kidney stone - tamsulosin (FLOMAX) 0.4 MG CAPS capsule; Take 1 capsule (0.4 mg total) by mouth daily.  Dispense: 30 capsule; Refill: 3 - HYDROcodone-acetaminophen (NORCO) 5-325 MG tablet; Take 1 tablet by mouth every 6 (six) hours as needed for moderate pain.  Dispense: 30 tablet; Refill: 0 -  ondansetron (ZOFRAN) 4 MG tablet; Take 1 tablet (4 mg total) by mouth every 8 (eight) hours as needed for nausea or vomiting.  Dispense: 20 tablet; Refill: 0 - Ambulatory referral to Urology  Force fluids Will give Norco as needed for pain, pt reviewed in [place] controlled database, no red flags Will do referral to Urologists PT was brought in to rule out bowel blockage since he was having constipation, but on exam on tenderness and large amount of blood in urine Strainer given Keep follow up with PCP   I discussed the assessment and treatment plan with the patient. The patient was provided an opportunity to ask questions and all were answered. The patient agreed with the plan and demonstrated an understanding of the instructions.   The patient was advised to call back or seek an in-person evaluation if the symptoms worsen or if the condition fails to improve as anticipated.  The above assessment and management plan was discussed with the patient. The patient verbalized understanding of and has agreed to the management plan. Patient is aware to call the clinic if symptoms persist or worsen. Patient is aware when to return to the clinic for a follow-up visit. Patient educated on when it is appropriate to go to the emergency department.   Time call ended:  8:45 AM, then patient came into office and spent 10 min, face to face with him  I provided 25 minutes of face-to-face time during this encounter.   Jannifer Rodneyhristy Josemaria Brining, FNP

## 2019-03-07 NOTE — Patient Instructions (Signed)

## 2019-03-17 ENCOUNTER — Other Ambulatory Visit (INDEPENDENT_AMBULATORY_CARE_PROVIDER_SITE_OTHER): Payer: 59 | Admitting: Family

## 2019-03-17 DIAGNOSIS — R109 Unspecified abdominal pain: Secondary | ICD-10-CM | POA: Diagnosis not present

## 2019-03-17 DIAGNOSIS — N200 Calculus of kidney: Secondary | ICD-10-CM | POA: Diagnosis not present

## 2019-03-17 NOTE — Progress Notes (Signed)
   Virtual Visit via telephone Note  I connected with Corey Wilcox on 03/17/19 at 7:43 pm by telephone and verified that I am speaking with the correct person using two identifiers. Corey Wilcox is currently located at home and no one is currently with her during visit. The provider, Evelina Dun, FNP is located in their office at time of visit.  I discussed the limitations, risks, security and privacy concerns of performing an evaluation and management service by telephone and the availability of in person appointments. I also discussed with the patient that there may be a patient responsible charge related to this service. The patient expressed understanding and agreed to proceed.   History and Present Illness:  PT calls today with recurrent left flank. He had a visit on 03/07/19 and was started on Flomax daily. He was given Norco and Zofran, but states he did not start until yesterday. However, he states he pain is still a 8-9 out 10.  Flank Pain This is a recurrent problem. The current episode started 1 to 4 weeks ago. The problem occurs intermittently. The problem has been waxing and waning since onset. Pain location: left flank. The quality of the pain is described as aching and stabbing. The pain does not radiate. The pain is at a severity of 9/10. The pain is moderate. The symptoms are aggravated by bending and standing. Associated symptoms include abdominal pain and dysuria. Pertinent negatives include no fever. He has tried analgesics and bed rest (norco, flomax) for the symptoms. The treatment provided no relief.      Review of Systems  Constitutional: Negative for fever.  Gastrointestinal: Positive for abdominal pain.  Genitourinary: Positive for dysuria and flank pain.  All other systems reviewed and are negative.    Observations/Objective: No SOB, grimacing in pain  Assessment and Plan: 1. Left flank pain  2. Kidney stone  Force fluids Continue Norco, Zofran,  and Flomax Discussed I could not prescribe controlled substances over the weekend and I could not prescribe anything stronger. Discussed if he can not tolerate the pain, I recommend going to the ED. He will go to the ED if his pain continues or if his urine output changes.     I discussed the assessment and treatment plan with the patient. The patient was provided an opportunity to ask questions and all were answered. The patient agreed with the plan and demonstrated an understanding of the instructions.   The patient was advised to call back or seek an in-person evaluation if the symptoms worsen or if the condition fails to improve as anticipated.  The above assessment and management plan was discussed with the patient. The patient verbalized understanding of and has agreed to the management plan. Patient is aware to call the clinic if symptoms persist or worsen. Patient is aware when to return to the clinic for a follow-up visit. Patient educated on when it is appropriate to go to the emergency department.   Time call ended: 7:56 pm   I provided 13 minutes of non-face-to-face time during this encounter.    Evelina Dun, FNP

## 2019-03-20 ENCOUNTER — Telehealth (HOSPITAL_COMMUNITY): Payer: Self-pay

## 2019-03-20 NOTE — Telephone Encounter (Signed)
Dr. Oley Balm office called regarding the patient Corey Wilcox DOB 03-30-1976. They called about his disability claim and would like a call back at [phone]. Thank you.

## 2019-03-20 NOTE — Telephone Encounter (Signed)
Left voice message to contact the office. If they return, please make sure that we have consent form to talk about the patient, ask the direct number to call, and also schedule a time for the call for 20 mins.
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
## 2019-04-03 ENCOUNTER — Telehealth (HOSPITAL_COMMUNITY): Payer: Self-pay | Admitting: Psychiatry

## 2019-04-03 NOTE — Progress Notes (Signed)
Virtual Visit via Video Note  I connected with Corey Wilcox on 04/05/19 at 11:00 AM EDT by a video enabled telemedicine application and verified that I am speaking with the correct person using two identifiers.   I discussed the limitations of evaluation and management by telemedicine and the availability of in person appointments. The patient expressed understanding and agreed to proceed.     I discussed the assessment and treatment plan with the patient. The patient was provided an opportunity to ask questions and all were answered. The patient agreed with the plan and demonstrated an understanding of the instructions.   The patient was advised to call back or seek an in-person evaluation if the symptoms worsen or if the condition fails to improve as anticipated.  I provided 25 minutes of non-face-to-face time during this encounter.   Norman Clay, MD    [facility] MD/PA/NP OP Progress Note  04/05/2019 11:31 AM Corey Wilcox  MRN:  [id number]  Chief Complaint:  Chief Complaint    Follow-up; Depression     HPI:  This is a follow-up appointment for depression.  He states that he is concerned as his FMLA will be expired this month.  However, his work is aware that he is planning to return to work on August 10 th. He states that his daughter will have C-section tomorrow, and he will be a grandparent. He feels nervous about it and also feels scared for her daughter.  It is also uncomfortable for him not being able to be in the waiting room due to pandemic.  His daughter has been contacting with the patient almost every day to update the status.  He wishes to be there as his father did when he was born.  He has less contact with his friend as his friend is now in relationship.  He reports good support from his mother, who he talks with on the phone. He has fair sleep. He feels depressed and feels burden to other people, although it has been improving. He feels less fatigue. He has mild  anhedonia.  He has fair concentration. He denies SI. He feels anxious, tense at times. He denies panic attacks. He has less intrusive thoughts about his ex-wife.    Visit Diagnosis:    ICD-10-CM   1. MDD (major depressive disorder), recurrent episode, moderate (Corey Wilcox)  F33.1     Past Psychiatric History: Please see initial evaluation for full details. I have reviewed the history. No updates at this time.     Past Medical History:  Past Medical History:  Diagnosis Date  . Essential hypertension, benign   . Fatty liver   . Hyperplasia of prostate   . Obesity   . Other and unspecified hyperlipidemia   . Palpitations     Past Surgical History:  Procedure Laterality Date  . tympanoplasty      Family Psychiatric History: Please see initial evaluation for full details. I have reviewed the history. No updates at this time.     Family History:  Family History  Problem Relation Age of Onset  . Stroke Father   . Atrial fibrillation Father     Social History:  Social History   Socioeconomic History  . Marital status: Divorced    Spouse name: Not on file  . Number of children: Not on file  . Years of education: Not on file  . Highest education level: Not on file  Occupational History  . Occupation: Freight forwarder  Social Needs  . Financial resource strain:  Not on file  . Food insecurity    Worry: Not on file    Inability: Not on file  . Transportation needs    Medical: Not on file    Non-medical: Not on file  Tobacco Use  . Smoking status: Former Smoker    Types: Cigarettes  . Smokeless tobacco: Never Used  . Tobacco comment: smoked occasionally many years ago  Substance and Sexual Activity  . Alcohol use: Yes    Alcohol/week: 0.0 standard drinks    Comment: rarely  . Drug use: No  . Sexual activity: Not on file  Lifestyle  . Physical activity    Days per week: Not on file    Minutes per session: Not on file  . Stress: Not on file  Relationships  . Social  Musicianconnections    Talks on phone: Not on file    Gets together: Not on file    Attends religious service: Not on file    Active member of club or organization: Not on file    Attends meetings of clubs or organizations: Not on file    Relationship status: Not on file  Other Topics Concern  . Not on file  Social History Narrative   Lives with mom.     Allergies:  Allergies  Allergen Reactions  . Cephalexin Nausea And Vomiting    Metabolic Disorder Labs: Lab Results  Component Value Date   HGBA1C 5.7 (H) 11/03/2014   MPG 117 11/03/2014   No results found for: PROLACTIN Lab Results  Component Value Date   CHOL 151 12/14/2017   TRIG 98 12/14/2017   HDL 46 12/14/2017   CHOLHDL 3.3 12/14/2017   LDLCALC 85 12/14/2017   LDLCALC 96 09/20/2016   No results found for: TSH  Therapeutic Level Labs: No results found for: LITHIUM No results found for: VALPROATE No components found for:  CBMZ  Current Medications: Current Outpatient Medications  Medication Sig Dispense Refill  . buPROPion (WELLBUTRIN XL) 150 MG 24 hr tablet Total of 450 mg daily (300 mg + 150 mg) 90 tablet 0  . [START ON 05/04/2019] buPROPion (WELLBUTRIN XL) 300 MG 24 hr tablet Total of 450 mg daily (300 mg + 150 mg) 90 tablet 0  . cetirizine (ZYRTEC) 10 MG tablet Take 10 mg by mouth daily.      . cholecalciferol (VITAMIN D) 1000 UNITS tablet Take 2,000 Units by mouth daily.     . DULoxetine (CYMBALTA) 60 MG capsule TAKE 1 CAPSULE BY MOUTH EVERY DAY 30 capsule 0  . [START ON 04/16/2019] DULoxetine (CYMBALTA) 60 MG capsule Take 2 capsules (120 mg total) by mouth daily. 180 capsule 0  . HYDROcodone-acetaminophen (NORCO) 5-325 MG tablet Take 1 tablet by mouth every 6 (six) hours as needed for moderate pain. 30 tablet 0  . olmesartan-hydrochlorothiazide (BENICAR HCT) 20-12.5 MG tablet Take 1 tablet by mouth daily. Needs to be seen for future refills 90 tablet 0  . ondansetron (ZOFRAN) 4 MG tablet Take 1 tablet (4 mg total)  by mouth every 8 (eight) hours as needed for nausea or vomiting. 20 tablet 0  . penicillin v potassium (VEETID) 500 MG tablet Take 1 tablet (500 mg total) by mouth daily. Maint. drug 120 tablet 3  . simvastatin (ZOCOR) 10 MG tablet TAKE 1 TABLET BY MOUTH EVERYDAY AT BEDTIME 90 tablet 3  . tamsulosin (FLOMAX) 0.4 MG CAPS capsule Take 1 capsule (0.4 mg total) by mouth daily. 30 capsule 3   No current facility-administered  medications for this visit.      Musculoskeletal: Strength & Muscle Tone: N/A Gait & Station: N/A Patient leans: N/A  Psychiatric Specialty Exam: Review of Systems  All other systems reviewed and are negative.   There were no vitals taken for this visit.There is no height or weight on file to calculate BMI.  General Appearance: Fairly Groomed  Eye Contact:  Good  Speech:  Clear and Coherent  Volume:  Normal  Mood:  Anxious  Affect:  Appropriate, Congruent and Restricted  Thought Process:  Coherent  Orientation:  Full (Time, Place, and Person)  Thought Content: Logical   Suicidal Thoughts:  No  Homicidal Thoughts:  No  Memory:  Immediate;   Good  Judgement:  Good  Insight:  Fair  Psychomotor Activity:  Normal  Concentration:  Concentration: Good and Attention Span: Good  Recall:  Good  Fund of Knowledge: Good  Language: Good  Akathisia:  No  Handed:  Right  AIMS (if indicated): not done  Assets:  Communication Skills Desire for Improvement  ADL's:  Intact  Cognition: WNL  Sleep:  Fair   Screenings: PHQ2-9     Office Visit from 11/17/2018 in [facility] Office Visit from 11/03/2018 in [facility] Visit from 10/20/2018 in [facility] Visit from 10/10/2018 in [facility]  PHQ-2 Total Score  4  4  4  6   0  PHQ-9 Total Score  15  13  18  20   -       Assessment and Plan:   Shirlean Mylaraul Foiles is a 43 y.o. year old male with a history of depression, hypertension, hyperlipidemia, obesity, who presents for follow up appointment for depression.   # MDD, moderate, recurrent without psychotic features # r/o PTSD Although exam is notable for slightly restricted affect, there has been steady improvement in depressive and PTSD symptoms since the last visit.  Recent psychosocial stressors includes upcoming c-section of his daughter and returning to work. Will uptitrate bupropion as adjunctive treatment for depression.  Discussed potential side effect of headache and worsening in anxiety.  He has no known history of seizure.  Will continue duloxetine for depression.   I will recommend that he will return to work without restriction on August 10 th to allow medication to be more effective to avoid relapse in his mood symptoms. He does have periods of depressed mood, anxiety, which would interfere with his capacity to go to work regularly, complete tasks, and communicate with other people effectively.   Plan I have reviewed and updated plans as below 1. Continueduloxetine 120 mg daily  2.Increase bupropion 450 mg (300 mg + 150 mg) daily 3. Next appointment: 9/15 at 11 AM for 30 mins, video 4.Expected return to work date:8/10 - he will continue to see his therapist as needed  Past trials of medication:sertraline (worsening in depression),  The patient demonstrates the following risk factors for suicide: Chronic risk factors for suicide include:psychiatric disorder ofdepression. Acute risk factorsfor suicide include: family or marital conflict and social withdrawal/isolation. Protective factorsfor this patient include: positive social support, coping skills and hope for the future. Considering these factors, the overall suicide risk at this point appears to below. Patientisappropriate for outpatient follow up.  Corey Hottereina Joniece Smotherman, MD 04/05/2019, 11:31 AM

## 2019-04-03 NOTE — Telephone Encounter (Signed)
We are asked to submit disability form by the day he is scheduled for follow up (7/31). We usually ask 3 business day to complete the form- could you ask him to reschedule the appointment to tomorrow or day after tomorrow? thanks.

## 2019-04-05 ENCOUNTER — Other Ambulatory Visit: Payer: Self-pay

## 2019-04-05 ENCOUNTER — Ambulatory Visit (INDEPENDENT_AMBULATORY_CARE_PROVIDER_SITE_OTHER): Payer: 59 | Admitting: Psychiatry

## 2019-04-05 ENCOUNTER — Encounter (HOSPITAL_COMMUNITY): Payer: Self-pay | Admitting: Psychiatry

## 2019-04-05 DIAGNOSIS — F331 Major depressive disorder, recurrent, moderate: Secondary | ICD-10-CM | POA: Diagnosis not present

## 2019-04-05 MED ORDER — BUPROPION HCL ER (XL) 150 MG PO TB24: ORAL_TABLET | ORAL | 0 refills | Status: DC

## 2019-04-05 MED ORDER — BUPROPION HCL ER (XL) 300 MG PO TB24: ORAL_TABLET | ORAL | 0 refills | Status: DC

## 2019-04-05 MED ORDER — DULOXETINE HCL 60 MG PO CPEP: 120.0000 mg | ORAL_CAPSULE | Freq: Every day | ORAL | 0 refills | Status: DC

## 2019-04-05 NOTE — Patient Instructions (Signed)
1.Continueduloxetine 120 mg daily  2.Increase bupropion 450 mg (300 mg + 150 mg) daily 3. Next appointment:9/15 at 11 AM

## 2019-04-06 ENCOUNTER — Ambulatory Visit (HOSPITAL_COMMUNITY): Payer: 59 | Admitting: Psychiatry

## 2019-04-19 ENCOUNTER — Other Ambulatory Visit: Payer: Self-pay | Admitting: Family Medicine
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
## 2019-05-23 ENCOUNTER — Ambulatory Visit (INDEPENDENT_AMBULATORY_CARE_PROVIDER_SITE_OTHER): Payer: 59 | Admitting: Psychiatry

## 2019-05-23 ENCOUNTER — Encounter (HOSPITAL_COMMUNITY): Payer: Self-pay | Admitting: Psychiatry

## 2019-05-23 ENCOUNTER — Other Ambulatory Visit: Payer: Self-pay

## 2019-05-23 DIAGNOSIS — F3341 Major depressive disorder, recurrent, in partial remission: Secondary | ICD-10-CM

## 2019-05-23 MED ORDER — BUPROPION HCL ER (XL) 150 MG PO TB24: ORAL_TABLET | ORAL | 0 refills | Status: DC

## 2019-05-23 MED ORDER — DULOXETINE HCL 60 MG PO CPEP: 120.0000 mg | ORAL_CAPSULE | Freq: Every day | ORAL | 0 refills | Status: DC

## 2019-05-23 MED ORDER — BUPROPION HCL ER (XL) 300 MG PO TB24: ORAL_TABLET | ORAL | 0 refills | Status: DC

## 2019-05-23 NOTE — Patient Instructions (Signed)
1.Continueduloxetine 120 mg daily  2.Continue bupropion 450 mg (300 mg + 150 mg) daily 3. Next appointment:in December

## 2019-05-29 ENCOUNTER — Other Ambulatory Visit: Payer: Self-pay | Admitting: Family

## 2019-05-29 DIAGNOSIS — M545 Low back pain, unspecified: Secondary | ICD-10-CM

## 2019-05-29 DIAGNOSIS — R109 Unspecified abdominal pain: Secondary | ICD-10-CM

## 2019-05-29 DIAGNOSIS — R319 Hematuria, unspecified: Secondary | ICD-10-CM

## 2019-05-29 DIAGNOSIS — N200 Calculus of kidney: Secondary | ICD-10-CM

## 2019-06-12 ENCOUNTER — Other Ambulatory Visit: Payer: Self-pay | Admitting: Family Medicine

## 2019-07-12 ENCOUNTER — Other Ambulatory Visit: Payer: Self-pay

## 2019-07-13 ENCOUNTER — Ambulatory Visit (INDEPENDENT_AMBULATORY_CARE_PROVIDER_SITE_OTHER): Payer: 59 | Admitting: Family Medicine

## 2019-07-13 ENCOUNTER — Encounter: Payer: Self-pay | Admitting: Family Medicine

## 2019-07-13 VITALS — BP 123/81 | HR 94 | Temp 97.3°F | Resp 20 | Ht 68.0 in | Wt 375.0 lb

## 2019-07-13 DIAGNOSIS — E7800 Pure hypercholesterolemia, unspecified: Secondary | ICD-10-CM | POA: Diagnosis not present

## 2019-07-13 DIAGNOSIS — I10 Essential (primary) hypertension: Secondary | ICD-10-CM

## 2019-07-13 DIAGNOSIS — F332 Major depressive disorder, recurrent severe without psychotic features: Secondary | ICD-10-CM | POA: Diagnosis not present

## 2019-07-13 DIAGNOSIS — R002 Palpitations: Secondary | ICD-10-CM

## 2019-07-13 MED ORDER — OLMESARTAN MEDOXOMIL-HCTZ 20-12.5 MG PO TABS: 1.0000 | ORAL_TABLET | Freq: Every day | ORAL | 2 refills | Status: DC

## 2019-07-13 MED ORDER — SIMVASTATIN 10 MG PO TABS: ORAL_TABLET | ORAL | 2 refills | Status: DC

## 2019-07-13 NOTE — Patient Instructions (Signed)

## 2019-07-13 NOTE — Progress Notes (Signed)
Subjective:  Patient ID: Corey Wilcox, male    DOB: 02/19/1976, 43 y.o.   MRN: [id number]  Patient Care Team: Sonny Masters, FNP as PCP - General (Family Medicine)   Chief Complaint:  Medical Management of Chronic Issues   HPI: Corey Wilcox is a 43 y.o. male presenting on 07/13/2019 for Medical Management of Chronic Issues   HPI   Corey Wilcox is a 43 yo male who presents for a follow up appointment for chronic disease management. He reports he has been doing well and has no specific concerns.   1. Hypertension Currently does not check BP at home but will be getting a monitor through work soon to be able to do so. Taking Benicar HCT as prescribed without adverse effects.   2. Hypercholesterolemia Does not regularly exercise but tries to stay active throughout the day. Improving diet. Taking simvastatin without side effects.   3. Depression Sees a physiatrist for management of depression and Wellbutrin and Cymbalta prescriptions. Reports symptoms are controlled. Denies SI and HI.  4. Palpitations Reports occasional palpitations that occurring with stress or anxiety inducing situtations. Reports this ongoing and unchanged for him. Denies SOB, CP, or dizziness with palpitations.    Relevant past medical, surgical, family, and social history reviewed and updated as indicated.  Allergies and medications reviewed and updated. Date reviewed: Chart in Epic.   Past Medical History:  Diagnosis Date  . Essential hypertension, benign   . Fatty liver   . Hyperplasia of prostate   . Obesity   . Other and unspecified hyperlipidemia   . Palpitations     Past Surgical History:  Procedure Laterality Date  . tympanoplasty      Social History   Socioeconomic History  . Marital status: Divorced    Spouse name: Not on file  . Number of children: Not on file  . Years of education: Not on file  . Highest education level: Not on file  Occupational History  . Occupation:  Production designer, theatre/television/film  Social Needs  . Financial resource strain: Not on file  . Food insecurity    Worry: Not on file    Inability: Not on file  . Transportation needs    Medical: Not on file    Non-medical: Not on file  Tobacco Use  . Smoking status: Former Smoker    Types: Cigarettes  . Smokeless tobacco: Never Used  . Tobacco comment: smoked occasionally many years ago  Substance and Sexual Activity  . Alcohol use: Yes    Alcohol/week: 0.0 standard drinks    Comment: rarely  . Drug use: No  . Sexual activity: Not on file  Lifestyle  . Physical activity    Days per week: Not on file    Minutes per session: Not on file  . Stress: Not on file  Relationships  . Social Musician on phone: Not on file    Gets together: Not on file    Attends religious service: Not on file    Active member of club or organization: Not on file    Attends meetings of clubs or organizations: Not on file    Relationship status: Not on file  . Intimate partner violence    Fear of current or ex partner: Not on file    Emotionally abused: Not on file    Physically abused: Not on file    Forced sexual activity: Not on file  Other Topics Concern  . Not on file  Social History Narrative   Lives with mom.     Outpatient Encounter Medications as of 07/13/2019  Medication Sig  . buPROPion (WELLBUTRIN XL) 150 MG 24 hr tablet Total of 450 mg daily (300 mg + 150 mg)  . [START ON 08/04/2019] buPROPion (WELLBUTRIN XL) 300 MG 24 hr tablet Total of 450 mg daily (300 mg + 150 mg)  . cetirizine (ZYRTEC) 10 MG tablet Take 10 mg by mouth daily.    . cholecalciferol (VITAMIN D) 1000 UNITS tablet Take 2,000 Units by mouth daily.   . DULoxetine (CYMBALTA) 60 MG capsule Take 2 capsules (120 mg total) by mouth daily.  Marland Kitchen olmesartan-hydrochlorothiazide (BENICAR HCT) 20-12.5 MG tablet TAKE 1 TABLET BY MOUTH DAILY. NEEDS TO BE SEEN FOR FUTURE REFILLS  . penicillin v potassium (VEETID) 500 MG tablet Take 1 tablet (500 mg  total) by mouth daily. Maint. drug  . simvastatin (ZOCOR) 10 MG tablet TAKE 1 TABLET BY MOUTH EVERYDAY AT BEDTIME  . [DISCONTINUED] HYDROcodone-acetaminophen (NORCO) 5-325 MG tablet Take 1 tablet by mouth every 6 (six) hours as needed for moderate pain.  . [DISCONTINUED] ondansetron (ZOFRAN) 4 MG tablet Take 1 tablet (4 mg total) by mouth every 8 (eight) hours as needed for nausea or vomiting.  . [DISCONTINUED] tamsulosin (FLOMAX) 0.4 MG CAPS capsule TAKE 1 CAPSULE BY MOUTH EVERY DAY   No facility-administered encounter medications on file as of 07/13/2019.     Allergies  Allergen Reactions  . Cephalexin Nausea And Vomiting  . Hydrocodone-Acetaminophen     Patient does not want. Seems to make pain worse    Review of Systems  Constitutional: Negative for activity change, appetite change, fatigue, fever and unexpected weight change.  Eyes: Negative for visual disturbance.  Respiratory: Negative for chest tightness and shortness of breath.   Cardiovascular: Positive for palpitations. Negative for chest pain and leg swelling.  Gastrointestinal: Negative for abdominal pain, blood in stool, constipation, diarrhea, nausea and vomiting.  Genitourinary: Negative for decreased urine volume, difficulty urinating, dysuria, frequency and urgency.  Musculoskeletal: Negative for arthralgias and myalgias.  Skin: Negative for rash and wound.  Neurological: Negative for dizziness, weakness, numbness and headaches.  Psychiatric/Behavioral: Negative for agitation, confusion and dysphoric mood. The patient is not nervous/anxious.         Objective:  BP 123/81   Pulse 94   Temp (!) 97.3 F (36.3 C) (Temporal)   Resp 20   Ht 5\' 8"  (1.727 m)   Wt (!) 375 lb (170.1 kg)   SpO2 97%   BMI 57.02 kg/m    Wt Readings from Last 3 Encounters:  07/13/19 (!) 375 lb (170.1 kg)  11/17/18 (!) 391 lb 8 oz (177.6 kg)  11/03/18 (!) 393 lb 4 oz (178.4 kg)   GAD 7 : Generalized Anxiety Score 07/13/2019  Nervous,  Anxious, on Edge 0  Control/stop worrying 0  Worry too much - different things 0  Trouble relaxing 0  Restless 0  Easily annoyed or irritable 0  Afraid - awful might happen 0  Total GAD 7 Score 0    Depression screen [facility] 2/9 07/13/2019 11/17/2018 11/03/2018  Decreased Interest 0 2 2  Down, Depressed, Hopeless 0 2 2  PHQ - 2 Score 0 4 4  Altered sleeping - 2 2  Tired, decreased energy - 2 2  Change in appetite - 1 0  Feeling bad or failure about yourself  - 2 2  Trouble concentrating - 2 1  Moving slowly or fidgety/restless -  2 2  Suicidal thoughts - 0 0  PHQ-9 Score - 15 13  Difficult doing work/chores - Somewhat difficult Somewhat difficult  Some recent data might be hidden     Physical Exam Vitals signs and nursing note reviewed.  Constitutional:      General: He is not in acute distress.    Appearance: Normal appearance. He is obese. He is not ill-appearing, toxic-appearing or diaphoretic.  HENT:     Head: Normocephalic and atraumatic.  Eyes:     Extraocular Movements: Extraocular movements intact.     Conjunctiva/sclera: Conjunctivae normal.     Pupils: Pupils are equal, round, and reactive to light.  Neck:     Musculoskeletal: Normal range of motion and neck supple. No muscular tenderness.     Vascular: No carotid bruit.  Cardiovascular:     Rate and Rhythm: Normal rate and regular rhythm.     Pulses: Normal pulses.     Heart sounds: Normal heart sounds. No murmur.  Pulmonary:     Effort: Pulmonary effort is normal. No respiratory distress.     Breath sounds: Normal breath sounds.  Abdominal:     General: There is no distension.     Palpations: Abdomen is soft.     Tenderness: There is no abdominal tenderness.  Musculoskeletal: Normal range of motion.     Right lower leg: No edema.     Left lower leg: No edema.  Skin:    General: Skin is warm and dry.     Capillary Refill: Capillary refill takes less than 2 seconds.  Neurological:     General: No focal  deficit present.     Mental Status: He is alert and oriented to person, place, and time.  Psychiatric:        Mood and Affect: Mood normal.        Behavior: Behavior normal.        Thought Content: Thought content normal.        Judgment: Judgment normal.     Results for orders placed or performed in visit on 03/07/19  Microscopic Examination   URINE  Result Value Ref Range   WBC, UA 0-5 0 - 5 /hpf   RBC >30 (A) 0 - 2 /hpf   Epithelial Cells (non renal) 0-10 0 - 10 /hpf   Renal Epithel, UA None seen None seen /hpf   Bacteria, UA None seen None seen/Few  Urinalysis, Complete  Result Value Ref Range   Specific Gravity, UA >1.030 (H) 1.005 - 1.030   pH, UA 5.5 5.0 - 7.5   Color, UA Amber (A) Yellow   Appearance Ur Clear Clear   Leukocytes,UA Negative Negative   Protein,UA 1+ (A) Negative/Trace   Glucose, UA Negative Negative   Ketones, UA Trace (A) Negative   RBC, UA 3+ (A) Negative   Bilirubin, UA Negative Negative   Urobilinogen, Ur 0.2 0.2 - 1.0 mg/dL   Nitrite, UA Negative Negative   Microscopic Examination See below:        Pertinent labs & imaging results that were available during my care of the patient were reviewed by me and considered in my medical decision making.  Assessment & Plan:  Corey Wilcox was seen today for medical management of chronic issues.  Diagnoses and all orders for this visit:  Severe episode of recurrent major depressive disorder, without psychotic features (HCC) Continue phychiatric care. Continue medications as prescribed. Report any new or worsening symptoms.   Essential hypertension BP well controlled.  Changes were not made in regimen today. Goal BP is 130/80. Pt aware to report any persistent high or low readings. DASH diet and exercise encouraged. Exercise at least 150 minutes per week and increase as tolerated. Goal BMI < 25. Stress management encouraged. Avoid nicotine and tobacco product use. Avoid excessive alcohol and NSAID's. Avoid more  than 2000 mg of sodium daily. Medications as prescribed. Follow up as scheduled.   Pure hypercholesterolemia Diet encouraged - increase intake of fresh fruits and vegetables, increase intake of lean proteins. Bake, broil, or grill foods. Avoid fried, greasy, and fatty foods. Avoid fast foods. Increase intake of fiber-rich whole grains. Exercise encouraged - at least 150 minutes per week and advance as tolerated.  Goal BMI < 25. Continue medications as prescribed. Follow up in 3-6 months as discussed.   Morbid obesity due to excess calories (HCC) Diet and exercise encouraged.  Palpitations With increased anxiety. Symptoms unchanged for patient without CP, SOB, or dizziness. Avoid caffeine and nicotine. Stress management. Report any new or worsening symptoms.    Continue all other maintenance medications.  Follow up plan: Return in about 6 months (around 01/10/2020), or if symptoms worsen or fail to improve, for HTN.  Continue healthy lifestyle choices, including diet (rich in fruits, vegetables, and lean proteins, and low in salt and simple carbohydrates) and exercise (at least 30 minutes of moderate physical activity daily).  Educational handout given for dash diet   The above assessment and management plan was discussed with the patient. The patient verbalized understanding of and has agreed to the management plan. Patient is aware to call the clinic if they develop any new symptoms or if symptoms persist or worsen. Patient is aware when to return to the clinic for a follow-up visit. Patient educated on when it is appropriate to go to the emergency department.   Corey Maresiffany Jerrie Schussler, RN, FNP student [facility] [phone]   I personally was present during the history, physical exam, and medical decision-making activities of this service and have verified that the service and findings are accurately documented in the nurse practitioner student's note.  Corey BaarsMichelle Rakes,  FNP-C [facility] [address] [place], [place] [address] [phone]

## 2019-07-14 LAB — THYROID PANEL WITH TSH
Free Thyroxine Index: 2.9 (ref 1.2–4.9)
T3 Uptake Ratio: 27 % (ref 24–39)
T4, Total: 10.8 ug/dL (ref 4.5–12.0)
TSH: 1.6 u[IU]/mL (ref 0.450–4.500)

## 2019-07-14 LAB — CBC WITH DIFFERENTIAL/PLATELET
Basophils Absolute: 0.1 10*3/uL (ref 0.0–0.2)
Basos: 1 %
EOS (ABSOLUTE): 0.1 10*3/uL (ref 0.0–0.4)
Eos: 2 %
Hematocrit: 46.1 % (ref 37.5–51.0)
Hemoglobin: 15.4 g/dL (ref 13.0–17.7)
Immature Grans (Abs): 0 10*3/uL (ref 0.0–0.1)
Immature Granulocytes: 0 %
Lymphocytes Absolute: 1.9 10*3/uL (ref 0.7–3.1)
Lymphs: 33 %
MCH: 32 pg (ref 26.6–33.0)
MCHC: 33.4 g/dL (ref 31.5–35.7)
MCV: 96 fL (ref 79–97)
Monocytes Absolute: 0.7 10*3/uL (ref 0.1–0.9)
Monocytes: 12 %
Neutrophils Absolute: 3 10*3/uL (ref 1.4–7.0)
Neutrophils: 52 %
Platelets: 306 10*3/uL (ref 150–450)
RBC: 4.82 x10E6/uL (ref 4.14–5.80)
RDW: 13.4 % (ref 11.6–15.4)
WBC: 5.8 10*3/uL (ref 3.4–10.8)

## 2019-07-14 LAB — CMP14+EGFR
ALT: 36 IU/L (ref 0–44)
AST: 23 IU/L (ref 0–40)
Albumin/Globulin Ratio: 1.8 (ref 1.2–2.2)
Albumin: 3.8 g/dL — ABNORMAL LOW (ref 4.0–5.0)
Alkaline Phosphatase: 57 IU/L (ref 39–117)
BUN/Creatinine Ratio: 16 (ref 9–20)
BUN: 15 mg/dL (ref 6–24)
Bilirubin Total: 0.4 mg/dL (ref 0.0–1.2)
CO2: 25 mmol/L (ref 20–29)
Calcium: 9.5 mg/dL (ref 8.7–10.2)
Chloride: 103 mmol/L (ref 96–106)
Creatinine, Ser: 0.96 mg/dL (ref 0.76–1.27)
GFR calc Af Amer: 111 mL/min/{1.73_m2} (ref >59)
GFR calc non Af Amer: 96 mL/min/{1.73_m2} (ref >59)
Globulin, Total: 2.1 g/dL (ref 1.5–4.5)
Glucose: 103 mg/dL — ABNORMAL HIGH (ref 65–99)
Potassium: 4 mmol/L (ref 3.5–5.2)
Sodium: 143 mmol/L (ref 134–144)
Total Protein: 5.9 g/dL — ABNORMAL LOW (ref 6.0–8.5)

## 2019-07-14 LAB — LIPID PANEL
Chol/HDL Ratio: 3.2 ratio (ref 0.0–5.0)
Cholesterol, Total: 159 mg/dL (ref 100–199)
HDL: 50 mg/dL (ref >39)
LDL Chol Calc (NIH): 91 mg/dL (ref 0–99)
Triglycerides: 100 mg/dL (ref 0–149)
VLDL Cholesterol Cal: 18 mg/dL (ref 5–40)

## 2019-07-24 ENCOUNTER — Ambulatory Visit: Payer: 59 | Admitting: Infectious Diseases

## 2019-07-31 ENCOUNTER — Encounter: Payer: Self-pay | Admitting: *Deleted

## 2019-08-15 ENCOUNTER — Ambulatory Visit (HOSPITAL_COMMUNITY): Payer: 59 | Admitting: Psychiatry

## 2019-08-21 ENCOUNTER — Ambulatory Visit (INDEPENDENT_AMBULATORY_CARE_PROVIDER_SITE_OTHER): Payer: Self-pay | Admitting: Infectious Diseases

## 2019-08-21 ENCOUNTER — Other Ambulatory Visit: Payer: Self-pay

## 2019-08-21 DIAGNOSIS — F332 Major depressive disorder, recurrent severe without psychotic features: Secondary | ICD-10-CM

## 2019-08-21 DIAGNOSIS — L03116 Cellulitis of left lower limb: Secondary | ICD-10-CM

## 2019-08-21 MED ORDER — PENICILLIN V POTASSIUM 500 MG PO TABS: 500.0000 mg | ORAL_TABLET | Freq: Every day | ORAL | 3 refills | Status: DC

## 2019-08-21 NOTE — Assessment & Plan Note (Signed)
Has lost sig weight.  Encouraged pt.

## 2019-08-21 NOTE — Assessment & Plan Note (Signed)
No recent recurrences We spoke about stopping his PEN but he wishes to continue.  Agree and we will see him back in 1 year.

## 2019-08-21 NOTE — Assessment & Plan Note (Signed)
Feels like his mood is better.

## 2019-08-21 NOTE — Progress Notes (Signed)
   Subjective:    Patient ID: Corey Wilcox, male    DOB: 25-Nov-1975, 43 y.o.   MRN: [id number]  HPI 43yo M with hx of morbid obesity (425#), fatty liver, who was admitted in Nov 2015 with sepsis and cellulitis.  Her returned 2-28 to 11-06-14 with the same. He was treated with vanco/levaquin due to a severe PEN allergy. He was tapered to vanco alone then to doxy. He was given this for 10 days then changed to low dose Clinda. His doppler was (-).  He was seen by allergy and had PEN challenge. Changed to PEN prophylaxis 2017.  Has become a grandfather this year, daughter was 22yo.   Still has some L leg tingling but no pain or erythema. He will keep off his leg for awhile then. He is wearing compression stockings more now. Going to buy more.   Has lost 55# since 2017. Has been eating less, less snacking. Eating less sweets.  No problems with PEN.    Review of Systems  Constitutional: Negative for chills and fever.  Gastrointestinal: Negative for constipation and diarrhea.  Skin: Negative for rash.  Please see HPI. All other systems reviewed and negative.      Objective:   Physical Exam evisit       Assessment & Plan:

## 2019-08-26 ENCOUNTER — Other Ambulatory Visit: Payer: Self-pay | Admitting: Family

## 2019-08-26 DIAGNOSIS — M545 Low back pain, unspecified: Secondary | ICD-10-CM

## 2019-08-26 DIAGNOSIS — R319 Hematuria, unspecified: Secondary | ICD-10-CM

## 2019-08-26 DIAGNOSIS — N200 Calculus of kidney: Secondary | ICD-10-CM

## 2019-08-26 DIAGNOSIS — R109 Unspecified abdominal pain: Secondary | ICD-10-CM

## 2019-08-27 ENCOUNTER — Other Ambulatory Visit (HOSPITAL_COMMUNITY): Payer: Self-pay | Admitting: Psychiatry

## 2019-08-27 MED ORDER — BUPROPION HCL ER (XL) 150 MG PO TB24: 150.0000 mg | ORAL_TABLET | ORAL | 2 refills | Status: DC

## 2019-09-22 ENCOUNTER — Other Ambulatory Visit: Payer: Self-pay | Admitting: Family

## 2019-09-22 DIAGNOSIS — R109 Unspecified abdominal pain: Secondary | ICD-10-CM

## 2019-09-22 DIAGNOSIS — N200 Calculus of kidney: Secondary | ICD-10-CM

## 2019-09-22 DIAGNOSIS — R319 Hematuria, unspecified: Secondary | ICD-10-CM

## 2019-09-22 DIAGNOSIS — M545 Low back pain, unspecified: Secondary | ICD-10-CM

## 2019-10-01 ENCOUNTER — Telehealth (HOSPITAL_COMMUNITY): Payer: Self-pay | Admitting: Psychiatry

## 2019-10-01 MED ORDER — DULOXETINE HCL 60 MG PO CPEP: 120.0000 mg | ORAL_CAPSULE | Freq: Every day | ORAL | 0 refills | Status: DC

## 2019-10-01 NOTE — Telephone Encounter (Signed)
Received request for refill for Cymbalta, prescription sent to pharmacy.

## 2019-10-14 ENCOUNTER — Other Ambulatory Visit: Payer: Self-pay | Admitting: Family Medicine

## 2019-10-14 DIAGNOSIS — I10 Essential (primary) hypertension: Secondary | ICD-10-CM

## 2019-11-29 ENCOUNTER — Encounter: Payer: Self-pay | Admitting: *Deleted

## 2019-12-15 ENCOUNTER — Ambulatory Visit: Payer: 59 | Attending: Internal Medicine

## 2019-12-15 DIAGNOSIS — Z23 Encounter for immunization: Secondary | ICD-10-CM

## 2019-12-15 NOTE — Progress Notes (Signed)
   Covid-19 Vaccination Clinic  Name:  Paeton Studer    MRN: [id number] DOB: 06-Oct-1975  12/15/2019  Mr. Klang was observed post Covid-19 immunization for 15 minutes without incident. He was provided with Vaccine Information Sheet and instruction to access the V-Safe system.   Mr. Fridman was instructed to call 911 with any severe reactions post vaccine: Marland Kitchen Difficulty breathing  . Swelling of face and throat  . A fast heartbeat  . A bad rash all over body  . Dizziness and weakness   Immunizations Administered    Name Date Dose VIS Date Route   Pfizer COVID-19 Vaccine 12/15/2019  1:01 PM 0.3 mL 08/17/2019 Intramuscular   Manufacturer: [employer], [employer]   Lot: [id number]   NDC: [address]-3

## 2020-01-07 ENCOUNTER — Ambulatory Visit (INDEPENDENT_AMBULATORY_CARE_PROVIDER_SITE_OTHER): Payer: BC Managed Care – PPO | Admitting: Family Medicine

## 2020-01-07 ENCOUNTER — Ambulatory Visit: Payer: Self-pay | Attending: Internal Medicine

## 2020-01-07 ENCOUNTER — Ambulatory Visit: Payer: 59 | Admitting: Family Medicine

## 2020-01-07 ENCOUNTER — Other Ambulatory Visit: Payer: Self-pay

## 2020-01-07 VITALS — BP 135/82 | HR 104 | Temp 97.9°F | Ht 69.0 in | Wt 372.0 lb

## 2020-01-07 DIAGNOSIS — N400 Enlarged prostate without lower urinary tract symptoms: Secondary | ICD-10-CM

## 2020-01-07 DIAGNOSIS — I10 Essential (primary) hypertension: Secondary | ICD-10-CM

## 2020-01-07 DIAGNOSIS — Z7689 Persons encountering health services in other specified circumstances: Secondary | ICD-10-CM | POA: Diagnosis not present

## 2020-01-07 DIAGNOSIS — E7800 Pure hypercholesterolemia, unspecified: Secondary | ICD-10-CM

## 2020-01-07 DIAGNOSIS — Z8619 Personal history of other infectious and parasitic diseases: Secondary | ICD-10-CM

## 2020-01-07 DIAGNOSIS — Z23 Encounter for immunization: Secondary | ICD-10-CM

## 2020-01-07 DIAGNOSIS — Z87442 Personal history of urinary calculi: Secondary | ICD-10-CM

## 2020-01-07 MED ORDER — OLMESARTAN MEDOXOMIL-HCTZ 20-12.5 MG PO TABS: 1.0000 | ORAL_TABLET | Freq: Every day | ORAL | 3 refills | Status: DC

## 2020-01-07 MED ORDER — TAMSULOSIN HCL 0.4 MG PO CAPS: 0.4000 mg | ORAL_CAPSULE | Freq: Every day | ORAL | 1 refills | Status: DC

## 2020-01-07 MED ORDER — SIMVASTATIN 10 MG PO TABS: ORAL_TABLET | ORAL | 3 refills | Status: DC

## 2020-01-07 NOTE — Progress Notes (Signed)
Subjective: CC: Establish care, hypertension, hyperlipidemia PCP: Janora Norlander, DO Corey Wilcox is a 44 y.o. male presenting to clinic today for:  1.  Hypertension with hyperlipidemia Patient reports compliance with Benicar HCT 20-12.5 mg daily, Zocor 10 mg daily.  No chest pain, shortness of breath, lower extremity edema, visual disturbance, abdominal pain or nausea or vomiting.  2.  History of renal stones Patient reports history of renal stones, the most recent which occurred last summer.  This was relieved by Flomax.  Patient wants to know if he can keep Flomax on hand to use if needed going forward.  He is trying to hydrate adequately.  He does have a history of BPH but denies any urinary symptoms.  This apparently was diagnosed by previous PCP on manual exam.  3.  History of sepsis Patient reports that he has history of sepsis secondary to skin infection.  He is seen by Dr. Johnnye Sima, infectious disease physician, and is chronically prescribed penicillin for this issue.   ROS: Per HPI  Allergies  Allergen Reactions  . Cephalexin Nausea And Vomiting  . Hydrocodone-Acetaminophen     Patient does not want. Seems to make pain worse   Past Medical History:  Diagnosis Date  . Essential hypertension, benign   . Fatty liver   . Hyperplasia of prostate   . Obesity   . Other and unspecified hyperlipidemia   . Palpitations     Current Outpatient Medications:  .  buPROPion (WELLBUTRIN XL) 150 MG 24 hr tablet, Take 1 tablet (150 mg total) by mouth every morning., Disp: 90 tablet, Rfl: 2 .  buPROPion (WELLBUTRIN XL) 300 MG 24 hr tablet, Total of 450 mg daily (300 mg + 150 mg), Disp: 90 tablet, Rfl: 0 .  cetirizine (ZYRTEC) 10 MG tablet, Take 10 mg by mouth daily.  , Disp: , Rfl:  .  cholecalciferol (VITAMIN D) 1000 UNITS tablet, Take 2,000 Units by mouth daily. , Disp: , Rfl:  .  DULoxetine (CYMBALTA) 60 MG capsule, Take 2 capsules (120 mg total) by mouth daily., Disp:  180 capsule, Rfl: 0 .  olmesartan-hydrochlorothiazide (BENICAR HCT) 20-12.5 MG tablet, TAKE 1 TABLET BY MOUTH DAILY. NEEDS TO BE SEEN FOR FUTURE REFILLS, Disp: 90 tablet, Rfl: 0 .  penicillin v potassium (VEETID) 500 MG tablet, Take 1 tablet (500 mg total) by mouth daily. Maint. drug, Disp: 120 tablet, Rfl: 3 .  simvastatin (ZOCOR) 10 MG tablet, TAKE 1 TABLET BY MOUTH EVERYDAY AT BEDTIME, Disp: 90 tablet, Rfl: 2 Social History   Socioeconomic History  . Marital status: Divorced    Spouse name: Not on file  . Number of children: Not on file  . Years of education: Not on file  . Highest education level: Not on file  Occupational History  . Occupation: Freight forwarder  Tobacco Use  . Smoking status: Former Smoker    Types: Cigarettes  . Smokeless tobacco: Never Used  . Tobacco comment: smoked occasionally many years ago  Substance and Sexual Activity  . Alcohol use: Yes    Alcohol/week: 0.0 standard drinks    Comment: rarely  . Drug use: No  . Sexual activity: Not on file  Other Topics Concern  . Not on file  Social History Narrative   Lives with mom.    Social Determinants of Health   Financial Resource Strain:   . Difficulty of Paying Living Expenses:   Food Insecurity:   . Worried About Charity fundraiser in the Last Year:   .  Ran Out of Food in the Last Year:   Transportation Needs:   . Freight forwarder (Medical):   Marland Kitchen Lack of Transportation (Non-Medical):   Physical Activity:   . Days of Exercise per Week:   . Minutes of Exercise per Session:   Stress:   . Feeling of Stress :   Social Connections:   . Frequency of Communication with Friends and Family:   . Frequency of Social Gatherings with Friends and Family:   . Attends Religious Services:   . Active Member of Clubs or Organizations:   . Attends Banker Meetings:   Marland Kitchen Marital Status:   Intimate Partner Violence:   . Fear of Current or Ex-Partner:   . Emotionally Abused:   Marland Kitchen Physically Abused:   .  Sexually Abused:    Family History  Problem Relation Age of Onset  . Stroke Father   . Atrial fibrillation Father     Objective: Office vital signs reviewed. BP 135/82   Pulse (!) 104   Temp 97.9 F (36.6 C) (Temporal)   Ht 5\' 9"  (1.753 m)   Wt (!) 372 lb (168.7 kg)   BMI 54.93 kg/m   Physical Examination:  General: Awake, alert, morbidly obese, No acute distress HEENT: Normal, sclera white, MMM, no carotid bruits Cardio: regular rate and rhythm, S1S2 heard, no murmurs appreciated Pulm: clear to auscultation bilaterally, no wheezes, rhonchi or rales; normal work of breathing on room air Extremities: warm, well perfused, No edema, cyanosis or clubbing; +2 pulses bilaterally MSK: ambulates independently Skin: dry; intact; no rashes or lesions  Assessment/ Plan: 45 y.o. male   1. Essential hypertension, benign Controlled.  Continue current regimen - olmesartan-hydrochlorothiazide (BENICAR HCT) 20-12.5 MG tablet; Take 1 tablet by mouth daily.  Dispense: 90 tablet; Refill: 3  2. Establishing care with new doctor, encounter for  3. Pure hypercholesterolemia No need for fasting labs today as this was just performed November.  He will see me again in November for full physical exam with fasting labs - simvastatin (ZOCOR) 10 MG tablet; TAKE 1 TABLET BY MOUTH EVERYDAY AT BEDTIME  Dispense: 90 tablet; Refill: 3  4. Hyperplasia of prostate We will place on Flomax.  We discussed that this medication could induce and orthostasis.  He has had some intermittent orthostasis as above.  Hydrate adequately. - tamsulosin (FLOMAX) 0.4 MG CAPS capsule; Take 1 capsule (0.4 mg total) by mouth daily.  Dispense: 90 capsule; Refill: 1  5. History of renal stone We discussed that if symptoms persist beyond 4 to 5 days or he develops any red flag signs or symptoms he is to see someone in the office immediately.  We discussed that some stones are not relieved by Flomax, particularly if they are  large - tamsulosin (FLOMAX) 0.4 MG CAPS capsule; Take 1 capsule (0.4 mg total) by mouth daily.  Dispense: 90 capsule; Refill: 1  6. History of sepsis Chronically treated with penicillin.  Managed by ID.   No orders of the defined types were placed in this encounter.  No orders of the defined types were placed in this encounter.    December, DO [facility] [phone]

## 2020-01-07 NOTE — Patient Instructions (Signed)

## 2020-01-07 NOTE — Progress Notes (Signed)
   Covid-19 Vaccination Clinic  Name:  Corey Wilcox    MRN: [id number] DOB: [birth year]  01/07/2020  Mr. Corey Wilcox was observed post Covid-19 immunization for 15 minutes without incident. He was provided with Vaccine Information Sheet and instruction to access the V-Safe system.   Mr. Corey Wilcox was instructed to call 911 with any severe reactions post vaccine: Marland Kitchen Difficulty breathing  . Swelling of face and throat  . A fast heartbeat  . A bad rash all over body  . Dizziness and weakness   Immunizations Administered    Name Date Dose VIS Date Route   Pfizer COVID-19 Vaccine 01/07/2020  1:29 PM 0.3 mL 10/31/2018 Intramuscular   Manufacturer: [employer], [employer]   Lot: [id number]   NDC: [address]-2

## 2020-01-10 ENCOUNTER — Ambulatory Visit: Payer: 59 | Admitting: Family Medicine

## 2020-07-11 ENCOUNTER — Ambulatory Visit: Payer: BC Managed Care – PPO | Admitting: Family Medicine

## 2020-07-18 ENCOUNTER — Other Ambulatory Visit: Payer: Self-pay

## 2020-07-18 ENCOUNTER — Encounter: Payer: Self-pay | Admitting: Infectious Diseases

## 2020-07-18 ENCOUNTER — Telehealth (INDEPENDENT_AMBULATORY_CARE_PROVIDER_SITE_OTHER): Payer: Self-pay | Admitting: Infectious Diseases

## 2020-07-18 DIAGNOSIS — L03116 Cellulitis of left lower limb: Secondary | ICD-10-CM

## 2020-07-18 MED ORDER — PENICILLIN V POTASSIUM 500 MG PO TABS: 500.0000 mg | ORAL_TABLET | Freq: Every day | ORAL | 3 refills | Status: DC

## 2020-07-18 NOTE — Assessment & Plan Note (Signed)
He wishes to continue on his Pen.  Will refill.  He is tolerating this well.  Will ask his PCP to consider ABIs with his sx.  Will f/u in 1 year.

## 2020-07-18 NOTE — Assessment & Plan Note (Signed)
His wt is down from his peak, he has o/w been stable.  He has not had recent f/u with bariatrics or nutrition. I offered to refer him to bariatrics +/o nutrition. He wishes to defer.  We spoke at length regarding getting a COIVD booster.

## 2020-07-18 NOTE — Progress Notes (Signed)
   Subjective:    Patient ID: Corey Wilcox, male    DOB: 1975/11/25, 44 y.o.   MRN: [id number]  HPI 44yo M with hx of morbid obesity (425#), fatty liver, who was admitted in Nov 2015 with sepsis and cellulitis.  Her returned 2-28 to 11-06-14 with the same. He was treated with vanco/levaquin due to a severe PEN allergy. He was tapered to vanco alone then to doxy. He was given this for 10 days then changed to low dose Clinda. His doppler was (-).  He was seen by allergy and had PEN challenge. Changed to PENprophylaxis2017. No difficulties with taking po pen- no pruritis, no rashes, no sob/cough.  Has not had any episodes of cellulitis, has had episodes of tingling in his L leg. Leg feels tired. No pain.   Has had some improvement with support stockings.   Review of Systems  Constitutional: Negative for chills and fever.  Respiratory: Negative for shortness of breath.   Skin: Negative for rash.       Objective:   Physical Exam 1. Due to the national emergency this service was provided using telemedicine. phone.  2. Consent from the patient for the telehealth visit and that you identified patient- DOB and name  3. Your locations, Pt and Provider- [facility], home  4. Chief complaint for visit- recurrent cellulitis.   5. Document anyone else on the call- no one  6. If the visit was a phone call, that you include the time you spent on the call.< 10 minutes         Assessment & Plan:

## 2020-08-22 ENCOUNTER — Other Ambulatory Visit: Payer: Self-pay

## 2020-08-22 ENCOUNTER — Ambulatory Visit: Payer: 59 | Admitting: Family Medicine

## 2020-08-22 ENCOUNTER — Encounter: Payer: Self-pay | Admitting: Family Medicine

## 2020-08-22 VITALS — BP 123/75 | HR 100 | Temp 99.1°F | Ht 69.0 in | Wt 389.0 lb

## 2020-08-22 DIAGNOSIS — Z823 Family history of stroke: Secondary | ICD-10-CM

## 2020-08-22 DIAGNOSIS — E7800 Pure hypercholesterolemia, unspecified: Secondary | ICD-10-CM

## 2020-08-22 DIAGNOSIS — Z8249 Family history of ischemic heart disease and other diseases of the circulatory system: Secondary | ICD-10-CM | POA: Diagnosis not present

## 2020-08-22 DIAGNOSIS — Z792 Long term (current) use of antibiotics: Secondary | ICD-10-CM

## 2020-08-22 DIAGNOSIS — I10 Essential (primary) hypertension: Secondary | ICD-10-CM | POA: Diagnosis not present

## 2020-08-22 DIAGNOSIS — N400 Enlarged prostate without lower urinary tract symptoms: Secondary | ICD-10-CM

## 2020-08-22 DIAGNOSIS — F4321 Adjustment disorder with depressed mood: Secondary | ICD-10-CM

## 2020-08-22 LAB — BAYER DCA HB A1C WAIVED: HB A1C (BAYER DCA - WAIVED): 5.3 % (ref <7.0)

## 2020-08-22 NOTE — Progress Notes (Signed)
Subjective: CC: Follow-up hypertension, hyperlipidemia PCP: Janora Norlander, DO Corey Wilcox is a 44 y.o. male presenting to clinic today for:  1.  Hypertension with hyperlipidemia/morbid obesity Patient is compliant with Benicar HCT 20-12.5 mg daily, Zocor 10 mg daily.  No chest pain, shortness of breath, edema, visual disturbance or dizziness.  Unfortunately, his mother did pass away recently from a massive heart attack that affected her LAD.  Apparently there was 100% occlusion.  He has been evaluated by cardiology previously but this was for heart palpitations.  He is very interested in getting the full checkup after the first of the year.  He wants to try and reduce his risk of stroke and heart attack is much as possible.  Family history is also significant for major CVA that caused the death of his father.  He has never had a sleep study.  2.  History of sepsis Patient continues to take penicillin daily.  He recently had a virtual visit with his infectious disease doctor and they recommended that he continue this medication indefinitely.  He has had no concerning symptoms lately.   ROS: Per HPI  Allergies  Allergen Reactions  . Cephalexin Nausea And Vomiting  . Hydrocodone-Acetaminophen     Patient does not want. Seems to make pain worse   Past Medical History:  Diagnosis Date  . Essential hypertension, benign   . Fatty liver   . Hyperplasia of prostate   . Obesity   . Other and unspecified hyperlipidemia   . Palpitations     Current Outpatient Medications:  .  cetirizine (ZYRTEC) 10 MG tablet, Take 10 mg by mouth daily., Disp: , Rfl:  .  cholecalciferol (VITAMIN D) 1000 UNITS tablet, Take 2,000 Units by mouth daily. , Disp: , Rfl:  .  olmesartan-hydrochlorothiazide (BENICAR HCT) 20-12.5 MG tablet, Take 1 tablet by mouth daily., Disp: 90 tablet, Rfl: 3 .  penicillin v potassium (VEETID) 500 MG tablet, Take 1 tablet (500 mg total) by mouth daily. Maint. drug,  Disp: 120 tablet, Rfl: 3 .  simvastatin (ZOCOR) 10 MG tablet, TAKE 1 TABLET BY MOUTH EVERYDAY AT BEDTIME, Disp: 90 tablet, Rfl: 3 .  tamsulosin (FLOMAX) 0.4 MG CAPS capsule, Take 1 capsule (0.4 mg total) by mouth daily. (Patient not taking: No sig reported), Disp: 90 capsule, Rfl: 1 Social History   Socioeconomic History  . Marital status: Divorced    Spouse name: Not on file  . Number of children: Not on file  . Years of education: Not on file  . Highest education level: Not on file  Occupational History  . Occupation: Freight forwarder  Tobacco Use  . Smoking status: Former Smoker    Types: Cigarettes  . Smokeless tobacco: Never Used  . Tobacco comment: smoked occasionally many years ago  Vaping Use  . Vaping Use: Never used  Substance and Sexual Activity  . Alcohol use: Yes    Alcohol/week: 0.0 standard drinks    Comment: rarely  . Drug use: No  . Sexual activity: Not on file  Other Topics Concern  . Not on file  Social History Narrative   Lives with mom.    Social Determinants of Health   Financial Resource Strain: Not on file  Food Insecurity: Not on file  Transportation Needs: Not on file  Physical Activity: Not on file  Stress: Not on file  Social Connections: Not on file  Intimate Partner Violence: Not on file   Family History  Problem Relation Age of Onset  .  Stroke Father        cause of death  . Atrial fibrillation Father   . Heart attack Mother 81       100% occlusion of LAD    Objective: Office vital signs reviewed. BP 123/75   Pulse 100   Temp 99.1 F (37.3 C) (Temporal)   Ht _0  (1.753 m)   Wt (!) 389 lb (176.4 kg)   SpO2 94%   BMI 57.45 kg/m   Physical Examination:  General: Awake, alert, morbidly obese, No acute distress HEENT: Normal; no carotid bruits Cardio: regular rate and rhythm, S1S2 heard, no murmurs appreciated Pulm: Distant breath sounds but appears to be clear to auscultation bilaterally, no wheezes, rhonchi or rales; normal work  of breathing on room air Extremities: warm, well perfused, No edema, cyanosis or clubbing  MSK: Wide-based gait  Assessment/ Plan: 44 y.o. male   Essential hypertension, benign - Plan: CMP14+EGFR  Pure hypercholesterolemia - Plan: CMP14+EGFR, Lipid Panel, CBC with Differential  Family history of myocardial infarction  Family history of cerebrovascular accident (CVA) in father  Hyperplasia of prostate - Plan: PSA  Morbid obesity due to excess calories ([place]) - Plan: TSH, Bayer DCA Hb A1c Waived  Chronic antibiotic suppression - Plan: CBC with Differential  Grief   Blood pressure is controlled.  We are going to check nonfasting labs today. I have highly encouraged him that we get him set up with cardiology, I think she would deserve the stress testing in the setting of multiple risk factors for MI.  His mother recently suffered a massive heart attack that led to her death.  We discussed needing to maximize risk reduction.  We will certainly need to help him with weight reduction, would like to get him evaluated for possible sleep apnea.  He seems amenable to this plan but would like to wait until after the first of the year.  Continue to follow-up with infectious disease.  Chronic suppression with penicillin.  Check CBC  Right now seems to be doing okay from a grieving standpoint.  No additional interventions were planned today but I would like to reassess him closely in February.  Orders Placed This Encounter  Procedures  . CMP14+EGFR  . Lipid Panel  . CBC with Differential  . TSH  . Bayer DCA Hb A1c Waived  . PSA   No orders of the defined types were placed in this encounter.    Janora Norlander, DO [place] [phone]

## 2020-08-22 NOTE — Patient Instructions (Signed)
We talked about seeing Cardiology for stress testing We talked about sleep testing for sleep apnea  You had labs performed today.  You will be contacted with the results of the labs once they are available, usually in the next 3 business days for routine lab work.  If you have an active my chart account, they will be released to your MyChart.  If you prefer to have these labs released to you via telephone, please let us know.  If you had a pap smear or biopsy performed, expect to be contacted in about 7-10 days.   Sleep Apnea Sleep apnea is a condition in which breathing pauses or becomes shallow during sleep. Episodes of sleep apnea usually last 10 seconds or longer, and they may occur as many as 20 times an hour. Sleep apnea disrupts your sleep and keeps your body from getting the rest that it needs. This condition can increase your risk of certain health problems, including:  Heart attack.  Stroke.  Obesity.  Diabetes.  Heart failure.  Irregular heartbeat. What are the causes? There are three kinds of sleep apnea:  Obstructive sleep apnea. This kind is caused by a blocked or collapsed airway.  Central sleep apnea. This kind happens when the part of the brain that controls breathing does not send the correct signals to the muscles that control breathing.  Mixed sleep apnea. This is a combination of obstructive and central sleep apnea. The most common cause of this condition is a collapsed or blocked airway. An airway can collapse or become blocked if:  Your throat muscles are abnormally relaxed.  Your tongue and tonsils are larger than normal.  You are overweight.  Your airway is smaller than normal. What increases the risk? You are more likely to develop this condition if you:  Are overweight.  Smoke.  Have a smaller than normal airway.  Are elderly.  Are male.  Drink alcohol.  Take sedatives or tranquilizers.  Have a family history of sleep apnea. What are  the signs or symptoms? Symptoms of this condition include:  Trouble staying asleep.  Daytime sleepiness and tiredness.  Irritability.  Loud snoring.  Morning headaches.  Trouble concentrating.  Forgetfulness.  Decreased interest in sex.  Unexplained sleepiness.  Mood swings.  Personality changes.  Feelings of depression.  Waking up often during the night to urinate.  Dry mouth.  Sore throat. How is this diagnosed? This condition may be diagnosed with:  A medical history.  A physical exam.  A series of tests that are done while you are sleeping (sleep study). These tests are usually done in a sleep lab, but they may also be done at home. How is this treated? Treatment for this condition aims to restore normal breathing and to ease symptoms during sleep. It may involve managing health issues that can affect breathing, such as high blood pressure or obesity. Treatment may include:  Sleeping on your side.  Using a decongestant if you have nasal congestion.  Avoiding the use of depressants, including alcohol, sedatives, and narcotics.  Losing weight if you are overweight.  Making changes to your diet.  Quitting smoking.  Using a device to open your airway while you sleep, such as: ? An oral appliance. This is a custom-made mouthpiece that shifts your lower jaw forward. ? A continuous positive airway pressure (CPAP) device. This device blows air through a mask when you breathe out (exhale). ? A nasal expiratory positive airway pressure (EPAP) device. This device has valves  that you put into each nostril. ? A bi-level positive airway pressure (BPAP) device. This device blows air through a mask when you breathe in (inhale) and breathe out (exhale).  Having surgery if other treatments do not work. During surgery, excess tissue is removed to create a wider airway. It is important to get treatment for sleep apnea. Without treatment, this condition can lead  to:  High blood pressure.  Coronary artery disease.  In men, an inability to achieve or maintain an erection (impotence).  Reduced thinking abilities. Follow these instructions at home: Lifestyle  Make any lifestyle changes that your health care provider recommends.  Eat a healthy, well-balanced diet.  Take steps to lose weight if you are overweight.  Avoid using depressants, including alcohol, sedatives, and narcotics.  Do not use any products that contain nicotine or tobacco, such as cigarettes, e-cigarettes, and chewing tobacco. If you need help quitting, ask your health care provider. General instructions  Take over-the-counter and prescription medicines only as told by your health care provider.  If you were given a device to open your airway while you sleep, use it only as told by your health care provider.  If you are having surgery, make sure to tell your health care provider you have sleep apnea. You may need to bring your device with you.  Keep all follow-up visits as told by your health care provider. This is important. Contact a health care provider if:  The device that you received to open your airway during sleep is uncomfortable or does not seem to be working.  Your symptoms do not improve.  Your symptoms get worse. Get help right away if:  You develop: ? Chest pain. ? Shortness of breath. ? Discomfort in your back, arms, or stomach.  You have: ? Trouble speaking. ? Weakness on one side of your body. ? Drooping in your face. These symptoms may represent a serious problem that is an emergency. Do not wait to see if the symptoms will go away. Get medical help right away. Call your local emergency services (911 in the [place] Do not drive yourself to the hospital. Summary  Sleep apnea is a condition in which breathing pauses or becomes shallow during sleep.  The most common cause is a collapsed or blocked airway.  The goal of treatment is to restore  normal breathing and to ease symptoms during sleep. This information is not intended to replace advice given to you by your health care provider. Make sure you discuss any questions you have with your health care provider. Document Revised: 02/07/2019 Document Reviewed: 04/18/2018 Elsevier Patient Education  2020 [employer].

## 2020-08-23 LAB — CMP14+EGFR
ALT: 40 IU/L (ref 0–44)
AST: 22 IU/L (ref 0–40)
Albumin/Globulin Ratio: 2.2 (ref 1.2–2.2)
Albumin: 3.9 g/dL — ABNORMAL LOW (ref 4.0–5.0)
Alkaline Phosphatase: 54 IU/L (ref 44–121)
BUN/Creatinine Ratio: 17 (ref 9–20)
BUN: 16 mg/dL (ref 6–24)
Bilirubin Total: 0.5 mg/dL (ref 0.0–1.2)
CO2: 27 mmol/L (ref 20–29)
Calcium: 9.3 mg/dL (ref 8.7–10.2)
Chloride: 104 mmol/L (ref 96–106)
Creatinine, Ser: 0.93 mg/dL (ref 0.76–1.27)
GFR calc Af Amer: 115 mL/min/{1.73_m2} (ref >59)
GFR calc non Af Amer: 99 mL/min/{1.73_m2} (ref >59)
Globulin, Total: 1.8 g/dL (ref 1.5–4.5)
Glucose: 100 mg/dL — ABNORMAL HIGH (ref 65–99)
Potassium: 4 mmol/L (ref 3.5–5.2)
Sodium: 143 mmol/L (ref 134–144)
Total Protein: 5.7 g/dL — ABNORMAL LOW (ref 6.0–8.5)

## 2020-08-23 LAB — CBC WITH DIFFERENTIAL/PLATELET
Basophils Absolute: 0.1 10*3/uL (ref 0.0–0.2)
Basos: 2 %
EOS (ABSOLUTE): 0.2 10*3/uL (ref 0.0–0.4)
Eos: 4 %
Hematocrit: 45.4 % (ref 37.5–51.0)
Hemoglobin: 15.9 g/dL (ref 13.0–17.7)
Immature Grans (Abs): 0 10*3/uL (ref 0.0–0.1)
Immature Granulocytes: 0 %
Lymphocytes Absolute: 1.9 10*3/uL (ref 0.7–3.1)
Lymphs: 35 %
MCH: 33.1 pg — ABNORMAL HIGH (ref 26.6–33.0)
MCHC: 35 g/dL (ref 31.5–35.7)
MCV: 94 fL (ref 79–97)
Monocytes Absolute: 0.7 10*3/uL (ref 0.1–0.9)
Monocytes: 13 %
Neutrophils Absolute: 2.4 10*3/uL (ref 1.4–7.0)
Neutrophils: 46 %
Platelets: 301 10*3/uL (ref 150–450)
RBC: 4.81 x10E6/uL (ref 4.14–5.80)
RDW: 13.6 % (ref 11.6–15.4)
WBC: 5.4 10*3/uL (ref 3.4–10.8)

## 2020-08-23 LAB — LIPID PANEL
Chol/HDL Ratio: 3.5 ratio (ref 0.0–5.0)
Cholesterol, Total: 159 mg/dL (ref 100–199)
HDL: 46 mg/dL (ref >39)
LDL Chol Calc (NIH): 90 mg/dL (ref 0–99)
Triglycerides: 130 mg/dL (ref 0–149)
VLDL Cholesterol Cal: 23 mg/dL (ref 5–40)

## 2020-08-23 LAB — PSA: Prostate Specific Ag, Serum: 0.3 ng/mL (ref 0.0–4.0)

## 2020-08-23 LAB — TSH: TSH: 1.7 u[IU]/mL (ref 0.450–4.500)

## 2020-10-24 ENCOUNTER — Ambulatory Visit: Payer: 59 | Admitting: Family Medicine

## 2020-11-26 ENCOUNTER — Ambulatory Visit: Payer: 59 | Admitting: Family Medicine

## 2021-01-01 ENCOUNTER — Ambulatory Visit (INDEPENDENT_AMBULATORY_CARE_PROVIDER_SITE_OTHER): Payer: 59 | Admitting: Family Medicine

## 2021-01-01 ENCOUNTER — Encounter: Payer: Self-pay | Admitting: Family Medicine

## 2021-01-01 ENCOUNTER — Other Ambulatory Visit: Payer: Self-pay

## 2021-01-01 VITALS — BP 139/89 | HR 100 | Temp 97.8°F | Ht 69.0 in | Wt >= 6400 oz

## 2021-01-01 DIAGNOSIS — J9801 Acute bronchospasm: Secondary | ICD-10-CM

## 2021-01-01 DIAGNOSIS — Z823 Family history of stroke: Secondary | ICD-10-CM | POA: Diagnosis not present

## 2021-01-01 DIAGNOSIS — Z8249 Family history of ischemic heart disease and other diseases of the circulatory system: Secondary | ICD-10-CM | POA: Diagnosis not present

## 2021-01-01 DIAGNOSIS — I10 Essential (primary) hypertension: Secondary | ICD-10-CM | POA: Diagnosis not present

## 2021-01-01 DIAGNOSIS — G5711 Meralgia paresthetica, right lower limb: Secondary | ICD-10-CM

## 2021-01-01 MED ORDER — ALBUTEROL SULFATE HFA 108 (90 BASE) MCG/ACT IN AERS
2.0000 | INHALATION_SPRAY | Freq: Four times a day (QID) | RESPIRATORY_TRACT | 0 refills | Status: DC | PRN
Start: 2021-01-01 — End: 2021-02-04

## 2021-01-01 MED ORDER — OLMESARTAN MEDOXOMIL-HCTZ 20-12.5 MG PO TABS: 1.0000 | ORAL_TABLET | Freq: Every day | ORAL | 3 refills | Status: DC

## 2021-01-01 NOTE — Patient Instructions (Signed)
   Sleep study and weight loss recommended.  Referral to Dr Antoine Poche placed.

## 2021-01-01 NOTE — Progress Notes (Signed)
Subjective: CC: Follow-up hypertension PCP: Raliegh Ip, DO ENI:DPOE Martelle is a 45 y.o. male presenting to clinic today for:  1.  Hypertension, morbid obesity, strong family history of CVA and MI Patient here for interval follow-up.  He has been compliant with his Benicar 20-12.5 mg daily.  No reports of chest pain, shortness of breath, dizziness.  He does report some wheezing however that occurs when he goes upstairs.  He continues to lead a sedentary lifestyle but has been trying to get more active at repairing/renovating a [place] home [place].  Both he and his sister will be planning to live in this home once completed.  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  It is being sold on [employer].  He reports some numbness that happens on the lateral aspect of the right thigh.  This is intermittent.  Seems to be worse after he stands for a while.  He does note that is happening more frequently but he admits he has been more active than normal.  Numbness does not extend beyond the lateral thigh.  Occasionally it is a burning sensation.  He denies any erythema, heat or swelling of that thigh.   ROS: Per HPI  Allergies  Allergen Reactions  . Cephalexin Nausea And Vomiting  . Hydrocodone-Acetaminophen     Patient does not want. Seems to make pain worse   Past Medical History:  Diagnosis Date  . Essential hypertension, benign   . Fatty liver   . Hyperplasia of prostate   . Obesity   . Other and unspecified hyperlipidemia   . Palpitations     Current Outpatient Medications:  .  cetirizine (ZYRTEC) 10 MG tablet, Take 10 mg by mouth daily., Disp: , Rfl:  .  cholecalciferol (VITAMIN D) 1000 UNITS tablet, Take 2,000 Units by mouth daily. , Disp: , Rfl:  .  olmesartan-hydrochlorothiazide (BENICAR HCT) 20-12.5 MG tablet, Take 1 tablet by mouth daily., Disp: 90 tablet, Rfl: 3 .  penicillin v potassium (VEETID)  500 MG tablet, Take 1 tablet (500 mg total) by mouth daily. Maint. drug, Disp: 120 tablet, Rfl: 3 .  simvastatin (ZOCOR) 10 MG tablet, TAKE 1 TABLET BY MOUTH EVERYDAY AT BEDTIME, Disp: 90 tablet, Rfl: 3 Social History   Socioeconomic History  . Marital status: Divorced    Spouse name: Not on file  . Number of children: Not on file  . Years of education: Not on file  . Highest education level: Not on file  Occupational History  . Occupation: Production designer, theatre/television/film  Tobacco Use  . Smoking status: Former Smoker    Types: Cigarettes  . Smokeless tobacco: Never Used  . Tobacco comment: smoked occasionally many years ago  Vaping Use  . Vaping Use: Never used  Substance and Sexual Activity  . Alcohol use: Yes    Alcohol/week: 0.0 standard drinks    Comment: rarely  . Drug use: No  . Sexual activity: Not on file  Other Topics Concern  . Not on file  Social History Narrative   Lives with mom.    Social Determinants of Health   Financial Resource Strain: Not on file  Food Insecurity: Not on file  Transportation Needs: Not on file  Physical Activity: Not on file  Stress: Not on file  Social Connections: Not on file  Intimate Partner Violence: Not on file   Family History  Problem Relation Age of Onset  .  Stroke Father        cause of death  . Atrial fibrillation Father   . Heart attack Mother 76       100% occlusion of LAD    Objective: Office vital signs reviewed. BP 139/89   Pulse 100   Temp 97.8 F (36.6 C)   Ht 5\' 9"  (1.753 m)   Wt (!) 418 lb 12.8 oz (190 kg)   SpO2 95%   BMI 61.85 kg/m   Physical Examination:  General: Awake, alert, morbidly obese, No acute distress Cardio: regular rate and rhythm, S1S2 heard, no murmurs appreciated Pulm: Distant breath sounds but seemingly clear to auscultation bilaterally, no wheezes, rhonchi or rales; normal work of breathing on room air MSK: Ambulating independently.  Normal tone  Assessment/ Plan: 45 y.o. male   Essential  hypertension, benign - Plan: Ambulatory referral to Cardiology, olmesartan-hydrochlorothiazide (BENICAR HCT) 20-12.5 MG tablet  Morbid obesity due to excess calories (HCC) - Plan: Ambulatory referral to Cardiology  Family history of myocardial infarction - Plan: Ambulatory referral to Cardiology  Family history of cerebrovascular accident (CVA) in father - Plan: Ambulatory referral to Cardiology  Bronchospasm - Plan: albuterol (VENTOLIN HFA) 108 (90 Base) MCG/ACT inhaler  Meralgia paresthetica of right side  Blood pressure under good control.  Continue current regimen.  I placed a referral to cardiology given morbid obesity, strong family history of CVA and MI.  I think he has multiple risk factors.  I did recommend that the sleep study but he declined this for now.  He would like to get cardiology's input first.  Weight loss is strongly recommended to reduce his cardiovascular risk.  If he is willing at some point I would like to send him to the bariatric specialist  It sounds like he is having bronchospasm intermittently.  This is likely in the setting of obesity and current seasonal change.  Albuterol inhaler has been provided to the patient.  We discussed that if he is requiring this frequently we should consider controller medication  The sensation he is describing on his right lateral thigh is consistent with meralgia paresthetica.  We discussed that excessive weight could impact this as well as tight clothing.  No orders of the defined types were placed in this encounter.  No orders of the defined types were placed in this encounter.    59, DO [facility] [phone]

## 2021-02-04 ENCOUNTER — Other Ambulatory Visit: Payer: Self-pay | Admitting: Family Medicine

## 2021-02-04 DIAGNOSIS — J9801 Acute bronchospasm: Secondary | ICD-10-CM

## 2021-02-10 ENCOUNTER — Encounter: Payer: Self-pay | Admitting: Nurse Practitioner

## 2021-02-10 ENCOUNTER — Ambulatory Visit (INDEPENDENT_AMBULATORY_CARE_PROVIDER_SITE_OTHER): Payer: 59 | Admitting: Nurse Practitioner

## 2021-02-10 ENCOUNTER — Other Ambulatory Visit: Payer: Self-pay

## 2021-02-10 VITALS — BP 126/88 | HR 72 | Temp 98.6°F | Ht 69.0 in | Wt >= 6400 oz

## 2021-02-10 DIAGNOSIS — M79671 Pain in right foot: Secondary | ICD-10-CM | POA: Diagnosis not present

## 2021-02-10 MED ORDER — NAPROXEN 500 MG PO TABS: 500.0000 mg | ORAL_TABLET | Freq: Two times a day (BID) | ORAL | 0 refills | Status: DC

## 2021-02-10 NOTE — Patient Instructions (Signed)

## 2021-02-10 NOTE — Assessment & Plan Note (Signed)
Patient is reporting unresolved pain for the last 8 days in right foot.  Patient reports his mother passed away in 2023-08-25 and ever since him and his sister have been doing home renovations, lifting and pulling while cleaning out and renovating the home he had hit his right shin on a TV and obtained a wound which has healed but slightly tender.  I provided education to patient to give time for hurting foot to  Heal.patient knows to seek emergency care if symptoms get worse or any signs and symptoms of blood clot, hot cough redness and pain.  I provided education to patient with printed handouts given.  Naproxen 500 mg tablet by mouth every 12 hours, ice pack and rest.  Follow-up with worsening or unresolved symptoms.

## 2021-02-10 NOTE — Progress Notes (Signed)
Acute Office Visit  Subjective:    Patient ID: Corey Wilcox, male    DOB: [birth year], 45 y.o.   MRN: [id number]  Chief Complaint  Patient presents with  . Foot Pain    HPI Patient is in today for Pain  He reports new onset right foot pain. was not an injury that may have caused the pain. The pain started about a week ago and is staying constant. The pain does radiate back of leg. The pain is described as aching, is moderate in intensity, occurring intermittently. Symptoms are worse in the: morning, mid-day  Aggravating factors: none Relieving factors: none.  He has tried acetaminophen with little relief.   ---------------------------------------------------------------------------------------------------   Past Medical History:  Diagnosis Date  . Essential hypertension, benign   . Fatty liver   . Hyperplasia of prostate   . Obesity   . Other and unspecified hyperlipidemia   . Palpitations     Past Surgical History:  Procedure Laterality Date  . tympanoplasty      Family History  Problem Relation Age of Onset  . Stroke Father        cause of death  . Atrial fibrillation Father   . Heart attack Mother 78       100% occlusion of LAD    Social History   Socioeconomic History  . Marital status: Divorced    Spouse name: Not on file  . Number of children: Not on file  . Years of education: Not on file  . Highest education level: Not on file  Occupational History  . Occupation: Production designer, theatre/television/film  Tobacco Use  . Smoking status: Former Smoker    Types: Cigarettes  . Smokeless tobacco: Never Used  . Tobacco comment: smoked occasionally many years ago  Vaping Use  . Vaping Use: Never used  Substance and Sexual Activity  . Alcohol use: Yes    Alcohol/week: 0.0 standard drinks    Comment: rarely  . Drug use: No  . Sexual activity: Not on file  Other Topics Concern  . Not on file  Social History Narrative   Lives with mom.    Social Determinants of Health    Financial Resource Strain: Not on file  Food Insecurity: Not on file  Transportation Needs: Not on file  Physical Activity: Not on file  Stress: Not on file  Social Connections: Not on file  Intimate Partner Violence: Not on file    Outpatient Medications Prior to Visit  Medication Sig Dispense Refill  . albuterol (VENTOLIN HFA) 108 (90 Base) MCG/ACT inhaler TAKE 2 PUFFS BY MOUTH EVERY 6 HOURS AS NEEDED FOR WHEEZE OR SHORTNESS OF BREATH 8.5 each 2  . cetirizine (ZYRTEC) 10 MG tablet Take 10 mg by mouth daily.    . cholecalciferol (VITAMIN D) 1000 UNITS tablet Take 2,000 Units by mouth daily.     Marland Kitchen olmesartan-hydrochlorothiazide (BENICAR HCT) 20-12.5 MG tablet Take 1 tablet by mouth daily. 90 tablet 3  . penicillin v potassium (VEETID) 500 MG tablet Take 1 tablet (500 mg total) by mouth daily. Maint. drug 120 tablet 3  . simvastatin (ZOCOR) 10 MG tablet TAKE 1 TABLET BY MOUTH EVERYDAY AT BEDTIME 90 tablet 3   No facility-administered medications prior to visit.    Allergies  Allergen Reactions  . Cephalexin Nausea And Vomiting  . Hydrocodone-Acetaminophen     Patient does not want. Seems to make pain worse    Review of Systems  Constitutional: Negative.   HENT: Negative.  Eyes: Negative.   Respiratory: Negative.   Gastrointestinal: Negative.   Skin: Negative.  Negative for color change.  All other systems reviewed and are negative.      Objective:    Physical Exam Vitals and nursing note reviewed.  Constitutional:      Appearance: Normal appearance.  HENT:     Head: Normocephalic.     Nose: Nose normal.  Eyes:     Conjunctiva/sclera: Conjunctivae normal.  Cardiovascular:     Rate and Rhythm: Normal rate and regular rhythm.     Pulses: Normal pulses.     Heart sounds: Normal heart sounds.  Pulmonary:     Effort: Pulmonary effort is normal.     Breath sounds: Normal breath sounds.  Abdominal:     General: Bowel sounds are normal.  Musculoskeletal:      Right foot: Normal range of motion. Swelling and tenderness present. No laceration.  Skin:    Findings: No rash.  Neurological:     Mental Status: He is alert and oriented to person, place, and time.  Psychiatric:        Behavior: Behavior normal.     BP 126/88   Pulse 72   Temp 98.6 F (37 C) (Temporal)   Ht 5\' 9"  (1.753 m)   Wt (!) 423 lb (191.9 kg)   BMI 62.47 kg/m  Wt Readings from Last 3 Encounters:  02/10/21 (!) 423 lb (191.9 kg)  01/01/21 (!) 418 lb 12.8 oz (190 kg)  08/22/20 (!) 389 lb (176.4 kg)    Health Maintenance Due  Topic Date Due  . Hepatitis C Screening  Never done  . COVID-19 Vaccine (3 - Booster for Pfizer series) 06/08/2020    There are no preventive care reminders to display for this patient.   Lab Results  Component Value Date   TSH 1.700 08/22/2020   Lab Results  Component Value Date   WBC 5.4 08/22/2020   HGB 15.9 08/22/2020   HCT 45.4 08/22/2020   MCV 94 08/22/2020   PLT 301 08/22/2020   Lab Results  Component Value Date   NA 143 08/22/2020   K 4.0 08/22/2020   CO2 27 08/22/2020   GLUCOSE 100 (H) 08/22/2020   BUN 16 08/22/2020   CREATININE 0.93 08/22/2020   BILITOT 0.5 08/22/2020   ALKPHOS 54 08/22/2020   AST 22 08/22/2020   ALT 40 08/22/2020   PROT 5.7 (L) 08/22/2020   ALBUMIN 3.9 (L) 08/22/2020   CALCIUM 9.3 08/22/2020   ANIONGAP 6 11/05/2014   Lab Results  Component Value Date   CHOL 159 08/22/2020   Lab Results  Component Value Date   HDL 46 08/22/2020   Lab Results  Component Value Date   LDLCALC 90 08/22/2020   Lab Results  Component Value Date   TRIG 130 08/22/2020   Lab Results  Component Value Date   CHOLHDL 3.5 08/22/2020   Lab Results  Component Value Date   HGBA1C 5.3 08/22/2020       Assessment & Plan:   Problem List Items Addressed This Visit      Other   Right foot pain - Primary    Patient is reporting unresolved pain for the last 8 days in right foot.  Patient reports his mother  passed away in 06-Sep-2023 and ever since him and his sister have been doing home renovations, lifting and pulling while cleaning out and renovating the home he had hit his right shin on a TV and obtained  a wound which has healed but slightly tender.  I provided education to patient to give time for hurting foot to  Heal.patient knows to seek emergency care if symptoms get worse or any signs and symptoms of blood clot, hot cough redness and pain.  I provided education to patient with printed handouts given.  Naproxen 500 mg tablet by mouth every 12 hours, ice pack and rest.  Follow-up with worsening or unresolved symptoms.             No orders of the defined types were placed in this encounter.    Daryll Drown, NP

## 2021-03-08 ENCOUNTER — Other Ambulatory Visit: Payer: Self-pay | Admitting: Family Medicine

## 2021-03-08 DIAGNOSIS — E7800 Pure hypercholesterolemia, unspecified: Secondary | ICD-10-CM

## 2021-03-10 ENCOUNTER — Other Ambulatory Visit: Payer: Self-pay

## 2021-03-10 DIAGNOSIS — M79671 Pain in right foot: Secondary | ICD-10-CM

## 2021-03-10 MED ORDER — NAPROXEN 500 MG PO TABS: 500.0000 mg | ORAL_TABLET | Freq: Two times a day (BID) | ORAL | 2 refills | Status: DC

## 2021-03-13 ENCOUNTER — Other Ambulatory Visit: Payer: Self-pay | Admitting: Family Medicine

## 2021-03-13 DIAGNOSIS — E7800 Pure hypercholesterolemia, unspecified: Secondary | ICD-10-CM

## 2021-04-20 NOTE — Progress Notes (Signed)
Cardiology Office Note   Date:  04/22/2021   ID:  Corey Wilcox, DOB 17-Oct-1975, MRN [id number]  PCP:  Raliegh Ip, DO  Cardiologist:   None Referring:  Raliegh Ip, DO  Chief Complaint  Patient presents with   Hypertension      History of Present Illness: Corey Wilcox is a 45 y.o. male who is referred by Raliegh Ip, DO for evaluation of hypertension and multiple cardiovascular risk factors..  In 2016 he had an echo with an EF of 50 - 55%.    I saw him in 2016 for HTN.  Since I last saw him he has had no new cardiovascular complaints.  His mother died of a sudden heart attack.  He was living with her at 1 point.  He has been living with his sister but they are both going to fix up their parents house and move into it.  He has been carrying some boxes.  With this level of activity he denies any cardiovascular symptoms.  He has had no chest pressure, neck or arm discomfort.  He has had no shortness of breath, PND or orthopnea.  He has had no palpitations, presyncope or syncope.  Past Medical History:  Diagnosis Date   Essential hypertension, benign    Fatty liver    Hyperlipemia    Hyperplasia of prostate    Obesity    Other and unspecified hyperlipidemia     Past Surgical History:  Procedure Laterality Date   tympanoplasty       Current Outpatient Medications  Medication Sig Dispense Refill   albuterol (VENTOLIN HFA) 108 (90 Base) MCG/ACT inhaler TAKE 2 PUFFS BY MOUTH EVERY 6 HOURS AS NEEDED FOR WHEEZE OR SHORTNESS OF BREATH 8.5 each 2   cetirizine (ZYRTEC) 10 MG tablet Take 10 mg by mouth daily.     naproxen (NAPROSYN) 500 MG tablet Take 1 tablet (500 mg total) by mouth 2 (two) times daily with a meal. (Patient taking differently: Take 500 mg by mouth 2 (two) times daily as needed.) 60 tablet 2   olmesartan-hydrochlorothiazide (BENICAR HCT) 20-12.5 MG tablet Take 1 tablet by mouth daily. 90 tablet 3   penicillin v potassium (VEETID) 500 MG  tablet Take 1 tablet (500 mg total) by mouth daily. Maint. drug 120 tablet 3   simvastatin (ZOCOR) 10 MG tablet TAKE 1 TABLET BY MOUTH EVERYDAY AT BEDTIME 90 tablet 0   cholecalciferol (VITAMIN D) 1000 UNITS tablet Take 2,000 Units by mouth daily.  (Patient not taking: Reported on 04/22/2021)     No current facility-administered medications for this visit.    Allergies:   Cephalexin and Hydrocodone-acetaminophen    Social History:  The patient  reports that he has quit smoking. His smoking use included cigarettes. He has never used smokeless tobacco. He reports current alcohol use. He reports that he does not use drugs.   Family History:  The patient's family history includes Atrial fibrillation in his father; Heart attack (age of onset: 67) in his mother; Stroke in his father.    ROS:  Please see the history of present illness.   Otherwise, review of systems are positive for none.   All other systems are reviewed and negative.    PHYSICAL EXAM: VS:  BP 130/84   Pulse (!) 112   Ht 5\' 9"  (1.753 m)   Wt (!) 438 lb (198.7 kg)   BMI 64.68 kg/m  , BMI Body mass index is 64.68 kg/m. GENERAL:  Well appearing HEENT:  Pupils equal round and reactive, fundi not visualized, oral mucosa unremarkable NECK:  No jugular venous distention, waveform within normal limits, carotid upstroke brisk and symmetric, no bruits, no thyromegaly LYMPHATICS:  No cervical, inguinal adenopathy LUNGS:  Clear to auscultation bilaterally BACK:  No CVA tenderness CHEST:  Unremarkable HEART:  PMI not displaced or sustained,S1 and S2 within normal limits, no S3, no S4, no clicks, no rubs, no murmurs ABD:  Flat, positive bowel sounds normal in frequency in pitch, no bruits, no rebound, no guarding, no midline pulsatile mass, no hepatomegaly, no splenomegaly EXT:  2 plus pulses throughout, no edema, no cyanosis no clubbing SKIN:  No rashes no nodules NEURO:  Cranial nerves II through XII grossly intact, motor grossly  intact throughout PSYCH:  Cognitively intact, oriented to person place and time    EKG:  EKG is ordered today. The ekg ordered today demonstrates sinus tachycardia, rate 112, axis within normal limits, intervals within normal limits, no acute ST-T wave changes.   Recent Labs: 08/22/2020: ALT 40; BUN 16; Creatinine, Ser 0.93; Hemoglobin 15.9; Platelets 301; Potassium 4.0; Sodium 143; TSH 1.700    Lipid Panel    Component Value Date/Time   CHOL 159 08/22/2020 0956   TRIG 130 08/22/2020 0956   HDL 46 08/22/2020 0956   CHOLHDL 3.5 08/22/2020 0956   LDLCALC 90 08/22/2020 0956      Wt Readings from Last 3 Encounters:  04/22/21 (!) 438 lb (198.7 kg)  02/10/21 (!) 423 lb (191.9 kg)  01/01/21 (!) 418 lb 12.8 oz (190 kg)      Other studies Reviewed: Additional studies/ records that were reviewed today include: Labs. Review of the above records demonstrates:  Please see elsewhere in the note.     ASSESSMENT AND PLAN:  HTN: His blood pressure is actually well controlled.  No change.  TACHYCARDIA: He does have a baseline resting high blood pressure.  However, he has had a normal thyroid test since he has had this noted.  His EKG is otherwise unremarkable.  He does not feel this.  I think he should have some progressive increase in his physical activity and see if we can drive his heart rate down by improving vagal tone.  MORBID OBESITY: This is his biggest issue and I have referred him to [facility].  We had a conversation around diet.   Current medicines are reviewed at length with the patient today.  The patient does not have concerns regarding medicines.  The following changes have been made:  no change  Labs/ tests ordered today include: None  Orders Placed This Encounter  Procedures   Amb Ref to [facility]   EKG 12-Lead      Disposition:   FU with me as needed   Signed, Rollene Rotunda, MD  04/22/2021 12:47 PM    [facility]

## 2021-04-22 ENCOUNTER — Ambulatory Visit (INDEPENDENT_AMBULATORY_CARE_PROVIDER_SITE_OTHER): Payer: 59 | Admitting: Cardiology

## 2021-04-22 ENCOUNTER — Encounter: Payer: Self-pay | Admitting: Cardiology

## 2021-04-22 ENCOUNTER — Other Ambulatory Visit: Payer: Self-pay

## 2021-04-22 DIAGNOSIS — I10 Essential (primary) hypertension: Secondary | ICD-10-CM

## 2021-04-22 NOTE — Patient Instructions (Signed)
Medication Instructions:  The current medical regimen is effective;  continue present plan and medications.  *If you need a refill on your cardiac medications before your next appointment, please call your pharmacy*  Lab Work: None If you have labs (blood work) drawn today and your tests are completely normal, you will receive your results only by: MyChart Message (if you have MyChart) OR A paper copy in the mail If you have any lab test that is abnormal or we need to change your treatment, we will call you to review the results.  Testing/Procedures: None  You have been referred to [facility].  You will be contacted to be scheduled.  Follow-Up: At [facility], you and your health needs are our priority.  As part of our continuing mission to provide you with exceptional heart care, we have created designated Provider Care Teams.  These Care Teams include your primary Cardiologist (physician) and Advanced Practice Providers (APPs -  Physician Assistants and Nurse Practitioners) who all work together to provide you with the care you need, when you need it.  We recommend signing up for the patient portal called "MyChart".  Sign up information is provided on this After Visit Summary.  MyChart is used to connect with patients for Virtual Visits (Telemedicine).  Patients are able to view lab/test results, encounter notes, upcoming appointments, etc.  Non-urgent messages can be sent to your provider as well.   To learn more about what you can do with MyChart, go to [email or web address].    Your next appointment:   Follow up with Dr Antoine Poche as needed.  Thank you for choosing  [facility]!!

## 2021-06-02 ENCOUNTER — Ambulatory Visit (INDEPENDENT_AMBULATORY_CARE_PROVIDER_SITE_OTHER): Payer: 59 | Admitting: Family Medicine

## 2021-06-02 ENCOUNTER — Other Ambulatory Visit: Payer: Self-pay

## 2021-06-02 ENCOUNTER — Encounter: Payer: Self-pay | Admitting: Family Medicine

## 2021-06-02 ENCOUNTER — Ambulatory Visit (INDEPENDENT_AMBULATORY_CARE_PROVIDER_SITE_OTHER): Payer: 59

## 2021-06-02 VITALS — BP 131/84 | HR 107 | Temp 97.2°F | Ht 69.0 in | Wt >= 6400 oz

## 2021-06-02 DIAGNOSIS — R202 Paresthesia of skin: Secondary | ICD-10-CM

## 2021-06-02 DIAGNOSIS — R7301 Impaired fasting glucose: Secondary | ICD-10-CM

## 2021-06-02 DIAGNOSIS — R200 Anesthesia of skin: Secondary | ICD-10-CM

## 2021-06-02 DIAGNOSIS — R000 Tachycardia, unspecified: Secondary | ICD-10-CM | POA: Diagnosis not present

## 2021-06-02 MED ORDER — METHYLPREDNISOLONE ACETATE 80 MG/ML IJ SUSP
80.0000 mg | Freq: Once | INTRAMUSCULAR | Status: AC
  Administered 2021-06-02: 40 mg via INTRAMUSCULAR

## 2021-06-02 MED ORDER — METHYLPREDNISOLONE ACETATE 40 MG/ML IJ SUSP: 40.0000 mg | Freq: Once | INTRAMUSCULAR | Status: DC

## 2021-06-02 MED ORDER — PREDNISONE 10 MG PO TABS: ORAL_TABLET | ORAL | 0 refills | Status: DC

## 2021-06-02 NOTE — Progress Notes (Signed)
Subjective: CC: Numbness and tingling of left upper extremity PCP: Raliegh Ip, DO DJM:EQAS Corey Wilcox is a 45 y.o. male presenting to clinic today for:  1.  Numbness and tingling of left upper extremity/morbid obesity Patient reports acute onset about a week ago after he was holding a dog in his left arm trying to look for a mouse that had entered their home.  He notes that the following morning he woke up and started having some numbness and tingling in the tips of his left fingers and this gradually progressed into numbness and tingling up the left upper extremity into the shoulder.  He reports associated weakness in that left upper extremity and he is left-hand dominant.  He was worried at 1 point that he might be having a heart attack but has no chest pain or shortness of breath.  He recently saw cardiology and has been referred to healthy weight and wellness but has yet to secure an appointment due to backlog.  Denies any numbness tingling, weakness elsewhere.  Took some Naprosyn but did not find that change symptoms at all.   ROS: Per HPI  Allergies  Allergen Reactions   Cephalexin Nausea And Vomiting   Hydrocodone-Acetaminophen     Patient does not want. Seems to make pain worse   Past Medical History:  Diagnosis Date   Essential hypertension, benign    Fatty liver    Hyperlipemia    Hyperplasia of prostate    Obesity    Other and unspecified hyperlipidemia     Current Outpatient Medications:    albuterol (VENTOLIN HFA) 108 (90 Base) MCG/ACT inhaler, TAKE 2 PUFFS BY MOUTH EVERY 6 HOURS AS NEEDED FOR WHEEZE OR SHORTNESS OF BREATH, Disp: 8.5 each, Rfl: 2   naproxen (NAPROSYN) 500 MG tablet, Take 1 tablet (500 mg total) by mouth 2 (two) times daily with a meal. (Patient taking differently: Take 500 mg by mouth 2 (two) times daily as needed.), Disp: 60 tablet, Rfl: 2   penicillin v potassium (VEETID) 500 MG tablet, Take 1 tablet (500 mg total) by mouth daily. Maint.  drug, Disp: 120 tablet, Rfl: 3   simvastatin (ZOCOR) 10 MG tablet, TAKE 1 TABLET BY MOUTH EVERYDAY AT BEDTIME, Disp: 90 tablet, Rfl: 0   cetirizine (ZYRTEC) 10 MG tablet, Take 10 mg by mouth daily. (Patient not taking: Reported on 06/02/2021), Disp: , Rfl:    cholecalciferol (VITAMIN D) 1000 UNITS tablet, Take 2,000 Units by mouth daily.  (Patient not taking: Reported on 06/02/2021), Disp: , Rfl:    olmesartan-hydrochlorothiazide (BENICAR HCT) 20-12.5 MG tablet, Take 1 tablet by mouth daily., Disp: 90 tablet, Rfl: 3 Social History   Socioeconomic History   Marital status: Divorced    Spouse name: Not on file   Number of children: Not on file   Years of education: Not on file   Highest education level: Not on file  Occupational History   Occupation: Production designer, theatre/television/film  Tobacco Use   Smoking status: Former    Types: Cigarettes   Smokeless tobacco: Never   Tobacco comments:    smoked occasionally many years ago  Vaping Use   Vaping Use: Never used  Substance and Sexual Activity   Alcohol use: Yes    Alcohol/week: 0.0 standard drinks    Comment: rarely   Drug use: No   Sexual activity: Not on file  Other Topics Concern   Not on file  Social History Narrative   Lives with sister   .  Social Determinants of Health   Financial Resource Strain: Not on file  Food Insecurity: Not on file  Transportation Needs: Not on file  Physical Activity: Not on file  Stress: Not on file  Social Connections: Not on file  Intimate Partner Violence: Not on file   Family History  Problem Relation Age of Onset   Heart attack Mother 36       100% occlusion of LAD   Stroke Father        cause of death   Atrial fibrillation Father     Objective: Office vital signs reviewed. BP 131/84   Pulse (!) 107   Temp (!) 97.2 F (36.2 C)   Ht 5\' 9"  (1.753 m)   Wt (!) 429 lb 9.6 oz (194.9 kg)   SpO2 94%   BMI 63.44 kg/m   Physical Examination:  General: Awake, alert, morbid obese, No acute  distress HEENT: PERRLA, EOMI Cardio: Tachycardic with regular rhythm.  S1S2 heard, no murmurs appreciated Pulm: clear to auscultation bilaterally, no wheezes, rhonchi or rales; normal work of breathing on room air MSK:   C-spine: Has full active range of motion.  No midline tenderness palpation.  No paraspinal muscle tenderness palpation.  Negative Spurling.  Upper extremities: Decreased light touch sensation to the left upper extremity but intact to bilateral hands.  Grip strength normal and symmetric.  Overall felt he had 4 out of 5 strength bilaterally in the upper extremities with the exception of resisted internal rotation of the upper extremities he had 5 out of 5.  Tenderness palpation present along the posterior lateral aspect of the left shoulder.  Vibratory sensation intact Neuro: With exception of decreased light touch sensation in the upper extremity on the left there was no focal neurologic deficits identified on today's exam  Assessment/ Plan: 46 y.o. male   Numbness and tingling in left arm - Plan: EKG 12-Lead, DG Cervical Spine 2 or 3 views, predniSONE (DELTASONE) 10 MG tablet, methylPREDNISolone acetate (DEPO-MEDROL) injection 80 mg, DISCONTINUED: methylPREDNISolone acetate (DEPO-MEDROL) injection 40 mg  Morbid obesity due to excess calories (HCC) - Plan: EKG 12-Lead  Impaired fasting glucose  Sinus tachycardia  Suspect that his numbness and tingling left arm is secondary to radicular symptoms from the neck.  I did go ahead and run an EKG given his comorbidities including super morbid obesity, hypertension.  EKG did not show any ischemia.  Physical exam was notable for decreased sensation to the left upper extremity.  Sensation to the hand was intact bilaterally.  Grip strength was intact.  Strength intact.  He had tenderness to palpation along the posterior lateral aspect of the left shoulder.  Spurling's was negative.  Plain films have been ordered.  He was given a  corticosteroid injection.  Prednisone to be started tomorrow.  Physical therapy exercises given and I would like to reassess him in the next 2 to 4 weeks.  Has known impaired fasting glucose.  He will be working with healthy weight and wellness soon for morbid obesity.  Sinus tachycardia thought to be secondary to morbid obesity.  No orders of the defined types were placed in this encounter.  No orders of the defined types were placed in this encounter.    59, DO [facility] [phone]

## 2021-06-02 NOTE — Patient Instructions (Signed)
HOLD Naprosyn while taking Prednisone (start 9/28) Checking xray to look for evidence of slipped vertebral disk Do the physical therapy stretches I've sent home with you If no improvement in next couple weeks, plan for referral to orthopedics.    Cervical Radiculopathy Cervical radiculopathy means that a nerve in the neck (a cervical nerve) is pinched or bruised. This can happen because of an injury to the cervical spine (vertebrae) in the neck, or as a normal part of getting older. This can cause pain or loss of feeling (numbness) that runs from your neck all the way down to your arm and fingers. Often, this condition gets better with rest. Treatment may be needed if the condition does not get better. What are the causes? A neck injury. A bulging disk in your spine. Muscle movements that you cannot control (muscle spasms). Tight muscles in your neck due to overuse. Arthritis. Breakdown in the bones and joints of the spine (spondylosis) due to getting older. Bone spurs that form near the nerves in the neck. What are the signs or symptoms? Pain. The pain may: Run from the neck to the arm and hand. Be very bad or irritating. Be worse when you move your neck. Loss of feeling or tingling in your arm or hand. Weakness in your arm or hand, in very bad cases. How is this treated? In many cases, treatment is not needed for this condition. With rest, the condition often gets better over time. If treatment is needed, options may include: Wearing a soft neck collar (cervical collar) for short periods of time, as told by your doctor. Doing exercises (physical therapy) to strengthen your neck muscles. Taking medicines. Having shots (injections) in your spine, in very bad cases. Having surgery. This may be needed if other treatments do not help. The type of surgery that is used depends on the cause of your condition. Follow these instructions at home: If you have a soft neck collar: Wear it as  told by your doctor. Remove it only as told by your doctor. Ask your doctor if you can remove the collar for cleaning and bathing. If you are allowed to remove the collar for cleaning or bathing: Follow instructions from your doctor about how to remove the collar safely. Clean the collar by wiping it with mild soap and water and drying it completely. Take out any removable pads in the collar every 1-2 days. Wash them by hand with soap and water. Let them air-dry completely before you put them back in the collar. Check your skin under the collar for redness or sores. If you see any, tell your doctor. Managing pain   Take over-the-counter and prescription medicines only as told by your doctor. If told, put ice on the painful area. If you have a soft neck collar, remove it as told by your doctor. Put ice in a plastic bag. Place a towel between your skin and the bag. Leave the ice on for 20 minutes, 2-3 times a day. If using ice does not help, you can try using heat. Use the heat source that your doctor recommends, such as a moist heat pack or a heating pad. Place a towel between your skin and the heat source. Leave the heat on for 20-30 minutes. Remove the heat if your skin turns bright red. This is very important if you are unable to feel pain, heat, or cold. You may have a greater risk of getting burned. You may try a gentle neck and shoulder  rub (massage). Activity Rest as needed. Return to your normal activities as told by your doctor. Ask your doctor what activities are safe for you. Do exercises as told by your doctor or physical therapist. Do not lift anything that is heavier than 10 lb (4.5 kg) until your doctor tells you that it is safe. General instructions Use a flat pillow when you sleep. Do not drive while wearing a soft neck collar. If you do not have a soft neck collar, ask your doctor if it is safe to drive while your neck heals. Ask your doctor if the medicine prescribed to  you requires you to avoid driving or using heavy machinery. Do not use any products that contain nicotine or tobacco, such as cigarettes, e-cigarettes, and chewing tobacco. These can delay healing. If you need help quitting, ask your doctor. Keep all follow-up visits as told by your doctor. This is important. Contact a doctor if: Your condition does not get better with treatment. Get help right away if: Your pain gets worse and is not helped with medicine. You lose feeling or feel weak in your hand, arm, face, or leg. You have a high fever. You have a stiff neck. You cannot control when you poop or pee (have incontinence). You have trouble with walking, balance, or talking. Summary Cervical radiculopathy means that a nerve in the neck is pinched or bruised. A nerve can get pinched from a bulging disk, arthritis, an injury to the neck, or other causes. Symptoms include pain, tingling, or loss of feeling that goes from the neck into the arm or hand. Weakness in your arm or hand can happen in very bad cases. Treatment may include resting, wearing a soft neck collar, and doing exercises. You might need to take medicines for pain. In very bad cases, shots or surgery may be needed. This information is not intended to replace advice given to you by your health care provider. Make sure you discuss any questions you have with your health care provider. Document Revised: 07/14/2018 Document Reviewed: 07/14/2018 Elsevier Patient Education  2022 [employer].

## 2021-06-12 ENCOUNTER — Other Ambulatory Visit: Payer: Self-pay | Admitting: Family Medicine

## 2021-06-12 DIAGNOSIS — E7800 Pure hypercholesterolemia, unspecified: Secondary | ICD-10-CM

## 2021-06-20 ENCOUNTER — Other Ambulatory Visit: Payer: Self-pay | Admitting: Nurse Practitioner

## 2021-06-20 DIAGNOSIS — M79671 Pain in right foot: Secondary | ICD-10-CM

## 2021-07-06 ENCOUNTER — Ambulatory Visit: Payer: 59 | Admitting: Family Medicine

## 2021-07-11 ENCOUNTER — Other Ambulatory Visit: Payer: Self-pay | Admitting: Family Medicine

## 2021-07-11 DIAGNOSIS — E7800 Pure hypercholesterolemia, unspecified: Secondary | ICD-10-CM

## 2021-07-13 ENCOUNTER — Encounter: Payer: Self-pay | Admitting: Nurse Practitioner

## 2021-07-13 ENCOUNTER — Telehealth (INDEPENDENT_AMBULATORY_CARE_PROVIDER_SITE_OTHER): Payer: 59 | Admitting: Nurse Practitioner

## 2021-07-13 ENCOUNTER — Telehealth: Payer: Self-pay | Admitting: Family Medicine

## 2021-07-13 DIAGNOSIS — I10 Essential (primary) hypertension: Secondary | ICD-10-CM

## 2021-07-13 DIAGNOSIS — U071 COVID-19: Secondary | ICD-10-CM | POA: Diagnosis not present

## 2021-07-13 MED ORDER — BENZONATATE 100 MG PO CAPS: 100.0000 mg | ORAL_CAPSULE | Freq: Three times a day (TID) | ORAL | 0 refills | Status: DC | PRN

## 2021-07-13 MED ORDER — OLMESARTAN MEDOXOMIL-HCTZ 20-12.5 MG PO TABS: 1.0000 | ORAL_TABLET | Freq: Every day | ORAL | 0 refills | Status: DC

## 2021-07-13 MED ORDER — NIRMATRELVIR/RITONAVIR (PAXLOVID)TABLET: 3.0000 | ORAL_TABLET | Freq: Two times a day (BID) | ORAL | 0 refills | Status: AC

## 2021-07-13 NOTE — Assessment & Plan Note (Signed)
Take meds as prescribed - Use a cool mist humidifier  -Use saline nose sprays frequently -Force fluids -For fever or aches or pains- take Tylenol or ibuprofen. -Positive COVID-19 at home test. -Paxlovid RX sent to pharmacy.  Education provided to patient.  Patient verbalized understanding. -Tessalon Perles for cough. Follow up with worsening unresolved symptoms

## 2021-07-13 NOTE — Progress Notes (Addendum)
   Virtual Visit  Note Due to COVID-19 pandemic this visit was conducted virtually. This visit type was conducted due to national recommendations for restrictions regarding the COVID-19 Pandemic (e.g. social distancing, sheltering in place) in an effort to limit this patient's exposure and mitigate transmission in our community. All issues noted in this document were discussed and addressed.  A physical exam was not performed with this format.  I connected with Corey Wilcox on 07/19/21 at 1:00 pm  by telephone and verified that I am speaking with the correct person using two identifiers. Corey Wilcox is currently located at home during visit. The provider, Daryll Drown, NP is located in their office at time of visit.  I discussed the limitations, risks, security and privacy concerns of performing an evaluation and management service by telephone and the availability of in person appointments. I also discussed with the patient that there may be a patient responsible charge related to this service. The patient expressed understanding and agreed to proceed.   History and Present Illness:  URI  This is a new problem. Episode onset: In the past 2 days. The problem has been gradually worsening. There has been no fever. Associated symptoms include congestion, coughing, diarrhea, headaches and a sore throat. He has tried acetaminophen for the symptoms. The treatment provided mild relief.     Review of Systems  Constitutional:  Positive for malaise/fatigue.  HENT:  Positive for congestion and sore throat.   Respiratory:  Positive for cough.   Gastrointestinal:  Positive for diarrhea.  Neurological:  Positive for headaches.  All other systems reviewed and are negative.   Observations/Objective: Televisit patient not in distress.  Assessment and Plan: Take meds as prescribed - Use a cool mist humidifier  -Use saline nose sprays frequently -Force fluids -For fever or aches or pains-  take Tylenol or ibuprofen. -Positive COVID-19 at home test. -Paxlovid RX sent to pharmacy.  Education provided to patient.  Patient verbalized understanding. -Tessalon Perles for cough. Follow up with worsening unresolved symptoms   Follow Up Instructions: Follow-up with worsening or unresolved symptoms.    I discussed the assessment and treatment plan with the patient. The patient was provided an opportunity to ask questions and all were answered. The patient agreed with the plan and demonstrated an understanding of the instructions.   The patient was advised to call back or seek an in-person evaluation if the symptoms worsen or if the condition fails to improve as anticipated.  The above assessment and management plan was discussed with the patient. The patient verbalized understanding of and has agreed to the management plan. Patient is aware to call the clinic if symptoms persist or worsen. Patient is aware when to return to the clinic for a follow-up visit. Patient educated on when it is appropriate to go to the emergency department.   Time call ended: 1:11 PM  I provided 11 minutes of  non face-to-face time during this encounter.    Daryll Drown, NP

## 2021-07-13 NOTE — Telephone Encounter (Signed)
  Prescription Request  07/13/2021  Is this a "Controlled Substance" medicine? no  Have you seen your PCP in the last 2 weeks? No   If YES, route message to pool  -  If NO, patient needs to be scheduled for appointment.  What is the name of the medication or equipment?  Olmesartan 20-12.5 Mg  Have you contacted your pharmacy to request a refill? yes   Which pharmacy would you like this sent to? [facility] in [place]   Patient notified that their request is being sent to the clinical staff for review and that they should receive a response within 2 business days.

## 2021-07-13 NOTE — Telephone Encounter (Signed)
Pt aware refill sent to pharmacy 

## 2021-07-13 NOTE — Patient Instructions (Signed)
10 Things You Can Do to Manage Your COVID-19 Symptoms at Home ?If you have possible or confirmed COVID-19 ?Stay home except to get medical care. ?Monitor your symptoms carefully. If your symptoms get worse, call your healthcare provider immediately. ?Get rest and stay hydrated. ?If you have a medical appointment, call the healthcare provider ahead of time and tell them that you have or may have COVID-19. ?For medical emergencies, call 911 and notify the dispatch personnel that you have or may have COVID-19. ?Cover your cough and sneezes with a tissue or use the inside of your elbow. ?Wash your hands often with soap and water for at least 20 seconds or clean your hands with an alcohol-based hand sanitizer that contains at least 60% alcohol. ?As much as possible, stay in a specific room and away from other people in your home. Also, you should use a separate bathroom, if available. If you need to be around other people in or outside of the home, wear a mask. ?Avoid sharing personal items with other people in your household, like dishes, towels, and bedding. ?Clean all surfaces that are touched often, like counters, tabletops, and doorknobs. Use household cleaning sprays or wipes according to the label instructions. ?cdc.gov/coronavirus ?03/21/2020 ?This information is not intended to replace advice given to you by your health care provider. Make sure you discuss any questions you have with your health care provider. ?Document Revised: 05/15/2021 Document Reviewed: 05/15/2021 ?Elsevier Patient Education ? 2022 Elsevier Inc. ? ?

## 2021-07-28 ENCOUNTER — Ambulatory Visit: Payer: 59 | Admitting: Family Medicine

## 2021-08-14 ENCOUNTER — Ambulatory Visit: Payer: 59 | Admitting: Infectious Diseases

## 2021-08-18 ENCOUNTER — Ambulatory Visit (INDEPENDENT_AMBULATORY_CARE_PROVIDER_SITE_OTHER): Payer: 59

## 2021-08-18 ENCOUNTER — Other Ambulatory Visit: Payer: Self-pay

## 2021-08-18 ENCOUNTER — Encounter: Payer: Self-pay | Admitting: Infectious Diseases

## 2021-08-18 ENCOUNTER — Ambulatory Visit (INDEPENDENT_AMBULATORY_CARE_PROVIDER_SITE_OTHER): Payer: 59 | Admitting: Infectious Diseases

## 2021-08-18 DIAGNOSIS — Z23 Encounter for immunization: Secondary | ICD-10-CM | POA: Diagnosis not present

## 2021-08-18 DIAGNOSIS — Z8249 Family history of ischemic heart disease and other diseases of the circulatory system: Secondary | ICD-10-CM

## 2021-08-18 DIAGNOSIS — I10 Essential (primary) hypertension: Secondary | ICD-10-CM

## 2021-08-18 DIAGNOSIS — E7800 Pure hypercholesterolemia, unspecified: Secondary | ICD-10-CM

## 2021-08-18 DIAGNOSIS — L03116 Cellulitis of left lower limb: Secondary | ICD-10-CM

## 2021-08-18 MED ORDER — PENICILLIN V POTASSIUM 500 MG PO TABS: 500.0000 mg | ORAL_TABLET | Freq: Every day | ORAL | 3 refills | Status: DC

## 2021-08-18 NOTE — Progress Notes (Signed)
° °  Subjective:    Patient ID: Corey Wilcox, male    DOB: June 30, 1976, 45 y.o.   MRN: [id number]  HPI 45 yo M with hx of morbid obesity, fatty liver, who was admitted in Nov 2015 with sepsis and cellulitis.   Her returned 2-28 to 11-06-14 with the same. He was treated with vanco/levaquin due to a severe PEN allergy. He was tapered to vanco alone then to doxy. He was given this for 10 days then changed to low dose Clinda. His doppler was (-).   He was seen by allergy and had PEN challenge. Changed to PEN prophylaxis 2017.    He is wearing compression stockings not every day. Uses them on days he feels like he "needs them more- legs feel heavy and he feels tired".   Wears them more during cold weather. Wt has gone back up (375-> 433). Attributes to not wanting to exercise, not eating as healthy.  Has been enjoying being a grandfather.  Has published as an Investment banker, corporate.  Has had some sadness at the passing of his mother, ~ 46yr ago.  Has had tingling in his hands due to a pinched nerve in his neck. Numbness radiates down L arm, neck. Improves with stretching, worse with laying on L side.  No issues with PEN prophylaxis.   Has not gotten covid booster or flu vax.  Review of Systems  Constitutional:  Positive for unexpected weight change.  Respiratory:  Positive for wheezing. Negative for cough and shortness of breath.   Gastrointestinal:  Negative for constipation and diarrhea.  Genitourinary:  Negative for difficulty urinating.      Objective:   Physical Exam Vitals reviewed.  Constitutional:      Appearance: Normal appearance. He is obese. He is not ill-appearing or diaphoretic.  HENT:     Mouth/Throat:     Mouth: Mucous membranes are moist.     Pharynx: No oropharyngeal exudate.  Eyes:     Extraocular Movements: Extraocular movements intact.     Pupils: Pupils are equal, round, and reactive to light.  Cardiovascular:     Rate and Rhythm: Normal rate and regular rhythm.  Pulmonary:      Effort: Pulmonary effort is normal.     Breath sounds: Normal breath sounds.  Abdominal:     General: Bowel sounds are normal. There is no distension.     Palpations: Abdomen is soft.     Tenderness: There is no abdominal tenderness.  Musculoskeletal:        General: Normal range of motion.     Cervical back: Normal range of motion and neck supple.     Right lower leg: Edema present.     Left lower leg: Edema present.  Skin:    Findings: No erythema.  Neurological:     Mental Status: He is alert.          Assessment & Plan:

## 2021-08-18 NOTE — Progress Notes (Signed)
° °  Covid-19 Vaccination Clinic  Name:  Corey Wilcox    MRN: [id number] DOB: 08-21-1976  08/18/2021  Mr. Karaffa was observed post Covid-19 immunization for 15 minutes without incident. He was provided with Vaccine Information Sheet and instruction to access the V-Safe system.   Mr. Ide was instructed to call 911 with any severe reactions post vaccine: Difficulty breathing  Swelling of face and throat  A fast heartbeat  A bad rash all over body  Dizziness and weakness   Immunizations Administered     Name Date Dose VIS Date Route   Pfizer Covid-19 Vaccine Bivalent Booster 08/18/2021  3:18 PM 0.3 mL 05/06/2021 Intramuscular   Manufacturer: [employer], [employer]   Lot: [id number]   NDC: [phone]        Rasheema Truluck Lesli Albee, CMA

## 2021-08-18 NOTE — Assessment & Plan Note (Addendum)
He is doing well on pen.  Encouraged him to use compression stockings.  Encourage wt loss He defers flu vax, will take COVID booster.  Will see him back in 1 year.

## 2021-08-18 NOTE — Assessment & Plan Note (Addendum)
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  Would consider stress test, has seen Dr [place] Lions in last year.

## 2021-08-18 NOTE — Assessment & Plan Note (Signed)
Well controlled today.  Appreciate PCP.

## 2021-08-18 NOTE — Assessment & Plan Note (Signed)
Will encourage diet and exercise.

## 2021-08-18 NOTE — Assessment & Plan Note (Addendum)
Lab Results  Component Value Date   CHOL 159 08/22/2020   HDL 46 08/22/2020   LDLCALC 90 08/22/2020   TRIG 130 08/22/2020   CHOLHDL 3.5 08/22/2020    Well controlled on statin.

## 2021-08-26 ENCOUNTER — Other Ambulatory Visit: Payer: Self-pay | Admitting: Family Medicine

## 2021-09-14 ENCOUNTER — Ambulatory Visit: Payer: 59 | Admitting: Family Medicine

## 2021-10-04 ENCOUNTER — Other Ambulatory Visit: Payer: Self-pay | Admitting: Nurse Practitioner

## 2021-10-26 ENCOUNTER — Ambulatory Visit: Payer: 59 | Admitting: Family Medicine

## 2021-11-23 ENCOUNTER — Ambulatory Visit: Payer: 59 | Admitting: Family Medicine

## 2021-12-18 ENCOUNTER — Other Ambulatory Visit: Payer: Self-pay | Admitting: Nurse Practitioner

## 2021-12-18 ENCOUNTER — Other Ambulatory Visit: Payer: Self-pay | Admitting: Family Medicine

## 2021-12-18 DIAGNOSIS — I10 Essential (primary) hypertension: Secondary | ICD-10-CM

## 2021-12-25 ENCOUNTER — Ambulatory Visit: Payer: 59 | Admitting: Family Medicine

## 2022-01-08 ENCOUNTER — Encounter: Payer: Self-pay | Admitting: Infectious Diseases

## 2022-01-08 ENCOUNTER — Other Ambulatory Visit: Payer: Self-pay

## 2022-01-08 ENCOUNTER — Telehealth: Payer: Self-pay

## 2022-01-08 ENCOUNTER — Ambulatory Visit (INDEPENDENT_AMBULATORY_CARE_PROVIDER_SITE_OTHER): Payer: Self-pay | Admitting: Infectious Diseases

## 2022-01-08 VITALS — BP 140/79 | HR 88 | Temp 98.1°F | Resp 16 | Ht 69.0 in | Wt >= 6400 oz

## 2022-01-08 DIAGNOSIS — L03116 Cellulitis of left lower limb: Secondary | ICD-10-CM

## 2022-01-08 MED ORDER — DOXYCYCLINE HYCLATE 100 MG PO TABS: 100.0000 mg | ORAL_TABLET | Freq: Two times a day (BID) | ORAL | 0 refills | Status: DC

## 2022-01-08 NOTE — Telephone Encounter (Signed)
Called patient to discuss symptoms patient relayed via MyChart message this morning.  ? ?Patient stated he noticed redness developing on his L shin last night, with palpable warmth. Stated he had a fever, but checked his BP, which was stable. Took Tylenol at 2 am and again at 8 am.  ? ?Patient stated fever is now gone, but redness and warmth have persisted. Concerned due to history of cellulitis and sepsis. ? ?This RN spoke with Dr. Ninetta Lights and Asher Muir; per provider, patient scheduled for in-person visit this morning so provider can assess. ? ?Patient notified and agreed to appointment; heading to [facility]. ? ?Wyvonne Lenz, RN  ?

## 2022-01-08 NOTE — Progress Notes (Signed)
? ?  Subjective:  ? ? Patient ID: Corey Wilcox, male    DOB: 04-Mar-1976, 46 y.o.   MRN: [id number] ? ?HPI ?46 yo M with hx of morbid obesity, fatty liver, who was admitted in Nov 2015 with sepsis and cellulitis.   ?Her returned 2-28 to 11-06-14 with the same. He was treated with vanco/levaquin due to a severe PEN allergy. He was tapered to vanco alone then to doxy. He was given this for 10 days then changed to low dose Clinda. His doppler was (-).   ?He was seen by allergy and had PEN challenge. Changed to PEN prophylaxis 2017.  ?He has continued on this.  ? ?Over last 24h has had worsening pain and redness in his L leg.  ?Has put aveno/hydrocortisone with some improvement. He took his BP at home- was "normal for me".  A little high.  ?He had temp to 101.5 last pm. ?Relieved with tylenol, nl this am. Took more tylenol this am. ? ?Review of Systems  ?Constitutional:  Positive for fatigue and fever. Negative for chills.  ?Respiratory:  Negative for cough and shortness of breath.   ?Gastrointestinal:  Negative for constipation and diarrhea.  ?Genitourinary:  Negative for difficulty urinating.  ?Skin:  Positive for rash.  ?Neurological:  Positive for headaches.  ? ?   ?Objective:  ? Physical Exam ?Vitals reviewed.  ?Constitutional:   ?   General: He is not in acute distress. ?   Appearance: Normal appearance. He is obese. He is not ill-appearing, toxic-appearing or diaphoretic.  ?HENT:  ?   Mouth/Throat:  ?   Mouth: Mucous membranes are moist.  ?   Pharynx: No oropharyngeal exudate.  ?Eyes:  ?   Extraocular Movements: Extraocular movements intact.  ?   Pupils: Pupils are equal, round, and reactive to light.  ?Cardiovascular:  ?   Rate and Rhythm: Normal rate and regular rhythm.  ?Pulmonary:  ?   Effort: Pulmonary effort is normal.  ?   Breath sounds: Normal breath sounds.  ?Abdominal:  ?   General: Bowel sounds are normal. There is no distension.  ?   Palpations: Abdomen is soft.  ?   Tenderness: There is no abdominal  tenderness.  ?Musculoskeletal:     ?   General: Normal range of motion.  ?   Right lower leg: Edema present.  ?   Left lower leg: Edema present.  ?Neurological:  ?   General: No focal deficit present.  ?   Mental Status: He is alert.  ?Psychiatric:     ?   Mood and Affect: Mood normal.  ? ?  ? ?   ?Assessment & Plan:  ? ? ?

## 2022-01-08 NOTE — Assessment & Plan Note (Addendum)
He has had significant worsening over the last 24h ?We discussed oral vs long acting glycopeptide.  ?He would like to try the oral option.  ?Will try doxy which he has taken prior. ?Advised him to come back to hospital if he is having worsening erythema, f/c. I have also given him my cell # ?

## 2022-01-12 ENCOUNTER — Other Ambulatory Visit: Payer: Self-pay

## 2022-01-12 ENCOUNTER — Telehealth: Payer: Self-pay | Admitting: Infectious Diseases

## 2022-01-12 DIAGNOSIS — L03116 Cellulitis of left lower limb: Secondary | ICD-10-CM

## 2022-01-12 NOTE — Telephone Encounter (Signed)
Called pt to see how he was feeling- ?Erythema has slightly decreased, swelling has decreased slightly as well. "I feel like I'm on the road to recovery".  ?His blood was not done on 01-08-22.  ?I advised him to call [facility] and have his blood work done there today.   ?

## 2022-01-12 NOTE — Addendum Note (Signed)
Addended by: Harley Alto on: 01/12/2022 11:25 AM ? ? Modules accepted: Orders ? ?

## 2022-01-18 LAB — CBC
HCT: 49.1 % (ref 38.5–50.0)
Hemoglobin: 16.2 g/dL (ref 13.2–17.1)
MCH: 32 pg (ref 27.0–33.0)
MCHC: 33 g/dL (ref 32.0–36.0)
MCV: 96.8 fL (ref 80.0–100.0)
MPV: 11.1 fL (ref 7.5–12.5)
Platelets: 302 10*3/uL (ref 140–400)
RBC: 5.07 10*6/uL (ref 4.20–5.80)
RDW: 13.7 % (ref 11.0–15.0)
WBC: 8.5 10*3/uL (ref 3.8–10.8)

## 2022-01-18 LAB — CULTURE, BLOOD (SINGLE): MICRO NUMBER:: 13372397

## 2022-01-22 ENCOUNTER — Ambulatory Visit: Payer: Self-pay | Admitting: Family Medicine

## 2022-01-22 ENCOUNTER — Encounter: Payer: Self-pay | Admitting: Family Medicine

## 2022-01-22 VITALS — BP 137/85 | HR 72 | Temp 97.8°F | Ht 69.0 in | Wt >= 6400 oz

## 2022-01-22 DIAGNOSIS — I10 Essential (primary) hypertension: Secondary | ICD-10-CM

## 2022-01-22 DIAGNOSIS — E7800 Pure hypercholesterolemia, unspecified: Secondary | ICD-10-CM

## 2022-01-22 DIAGNOSIS — Q5564 Hidden penis: Secondary | ICD-10-CM

## 2022-01-22 MED ORDER — OLMESARTAN MEDOXOMIL-HCTZ 20-12.5 MG PO TABS: 1.0000 | ORAL_TABLET | Freq: Every day | ORAL | 3 refills | Status: DC

## 2022-01-22 MED ORDER — SIMVASTATIN 10 MG PO TABS: ORAL_TABLET | ORAL | 3 refills | Status: DC

## 2022-01-22 NOTE — Progress Notes (Signed)
Subjective: CC: Hypertension with hyperlipidemia PCP: Janora Norlander, DO NFA:Corey Wilcox is a 46 y.o. male presenting to clinic today for:  1.  Hypertension with hyperlipidemia Patient is compliant with medications.  No reports of chest pain, shortness of breath.  He did have an episode of left lower extremity cellulitis flare and was treated with doxycycline by his infectious disease doctor.  He is continued on penicillin daily  2.  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  He is a father of 1 child.  However, symptoms seem to be getting worse.  He feels like his weight probably has a lot to do with it but he also questions possible low testosterone.   ROS: Per HPI  Allergies  Allergen Reactions   Cephalexin Nausea And Vomiting   Hydrocodone-Acetaminophen     Patient does not want. Seems to make pain worse   Past Medical History:  Diagnosis Date   Essential hypertension, benign    Fatty liver    Hyperlipemia    Hyperplasia of prostate    Obesity    Other and unspecified hyperlipidemia     Current Outpatient Medications:    albuterol (VENTOLIN HFA) 108 (90 Base) MCG/ACT inhaler, TAKE 2 PUFFS BY MOUTH EVERY 6 HOURS AS NEEDED FOR WHEEZE OR SHORTNESS OF BREATH, Disp: 8.5 each, Rfl: 2   olmesartan-hydrochlorothiazide (BENICAR HCT) 20-12.5 MG tablet, TAKE 1 TABLET BY MOUTH EVERY DAY, Disp: 90 tablet, Rfl: 0   penicillin v potassium (VEETID) 500 MG tablet, Take 1 tablet (500 mg total) by mouth daily. Maint. drug, Disp: 120 tablet, Rfl: 3   simvastatin (ZOCOR) 10 MG tablet, TAKE 1 TABLET BY MOUTH EVERYDAY AT BEDTIME, Disp: 90 tablet, Rfl: 0   cetirizine (ZYRTEC) 10 MG tablet, Take 10 mg by mouth daily. (Patient not taking: Reported on 08/18/2021), Disp: , Rfl:    cholecalciferol (VITAMIN D) 1000 UNITS tablet, Take 2,000 Units by mouth daily.  (Patient not taking: Reported on  08/18/2021), Disp: , Rfl:  Social History   Socioeconomic History   Marital status: Divorced    Spouse name: Not on file   Number of children: Not on file   Years of education: Not on file   Highest education level: Not on file  Occupational History   Occupation: Freight forwarder  Tobacco Use   Smoking status: Former    Types: Cigarettes   Smokeless tobacco: Never   Tobacco comments:    smoked occasionally many years ago  Vaping Use   Vaping Use: Never used  Substance and Sexual Activity   Alcohol use: Yes    Alcohol/week: 0.0 standard drinks    Comment: rarely   Drug use: No   Sexual activity: Not on file  Other Topics Concern   Not on file  Social History Narrative   Lives with sister   .     Social Determinants of Health   Financial Resource Strain: Not on file  Food Insecurity: Not on file  Transportation Needs: Not on file  Physical Activity: Not on file  Stress: Not on file  Social Connections: Not on file  Intimate Partner Violence: Not on file   Family History  Problem Relation Age of Onset   Heart attack Mother 18       100% occlusion of LAD   Stroke Father        cause of death   Atrial fibrillation Father  Objective: Office vital signs reviewed. BP 137/85   Pulse 72   Temp 97.8 F (36.6 C)   Ht _0  (1.753 m)   Wt (!) 427 lb 12.8 oz (194 kg)   SpO2 93%   BMI 63.18 kg/m   Physical Examination:  General: Awake, alert, super morbidly obese, No acute distress HEENT: Sclera white Cardio: regular rate and rhythm, S1S2 heard, no murmurs appreciated Pulm: clear to auscultation bilaterally, no wheezes, rhonchi or rales; normal work of breathing on room air Extremities: warm, well perfused, lymphedema bilateral lower extremities. MSK: ambulating independently  Assessment/ Plan: 46 y.o. male   Essential hypertension, benign - Plan: olmesartan-hydrochlorothiazide (BENICAR HCT) 20-12.5 MG tablet, CMP14+EGFR  Pure hypercholesterolemia - Plan:  simvastatin (ZOCOR) 10 MG tablet  Hidden penis  Blood pressure well controlled.  Continue current regimen.  Check metabolic panel with renal function and liver enzymes since has not been collected in over 2 years now  We will defer lipid panel, testosterone level, PSA etc. for now.  I have given him the [facility] health financial assistance form and encouraged him to fill this out so that we can go ahead and pursue other screening tests etc.  I fear that his lack of insurance limits his seeking appropriate care and preventative care.  No orders of the defined types were placed in this encounter.  No orders of the defined types were placed in this encounter.    Janora Norlander, DO [place] [phone]

## 2022-01-22 NOTE — Patient Instructions (Signed)

## 2022-01-23 LAB — CMP14+EGFR
ALT: 53 IU/L — ABNORMAL HIGH (ref 0–44)
AST: 33 IU/L (ref 0–40)
Albumin/Globulin Ratio: 2 (ref 1.2–2.2)
Albumin: 3.9 g/dL — ABNORMAL LOW (ref 4.0–5.0)
Alkaline Phosphatase: 58 IU/L (ref 44–121)
BUN/Creatinine Ratio: 17 (ref 9–20)
BUN: 16 mg/dL (ref 6–24)
Bilirubin Total: 0.4 mg/dL (ref 0.0–1.2)
CO2: 27 mmol/L (ref 20–29)
Calcium: 9.4 mg/dL (ref 8.7–10.2)
Chloride: 102 mmol/L (ref 96–106)
Creatinine, Ser: 0.93 mg/dL (ref 0.76–1.27)
Globulin, Total: 2 g/dL (ref 1.5–4.5)
Glucose: 130 mg/dL — ABNORMAL HIGH (ref 70–99)
Potassium: 4 mmol/L (ref 3.5–5.2)
Sodium: 144 mmol/L (ref 134–144)
Total Protein: 5.9 g/dL — ABNORMAL LOW (ref 6.0–8.5)
eGFR: 103 mL/min/{1.73_m2} (ref >59)

## 2022-01-28 ENCOUNTER — Encounter: Payer: Self-pay | Admitting: Infectious Diseases

## 2022-02-04 ENCOUNTER — Other Ambulatory Visit: Payer: Self-pay

## 2022-02-04 ENCOUNTER — Ambulatory Visit (INDEPENDENT_AMBULATORY_CARE_PROVIDER_SITE_OTHER): Payer: Self-pay | Admitting: Infectious Diseases

## 2022-02-04 ENCOUNTER — Encounter: Payer: Self-pay | Admitting: Infectious Diseases

## 2022-02-04 DIAGNOSIS — I10 Essential (primary) hypertension: Secondary | ICD-10-CM

## 2022-02-04 DIAGNOSIS — Z6841 Body Mass Index (BMI) 40.0 and over, adult: Secondary | ICD-10-CM

## 2022-02-04 DIAGNOSIS — L03116 Cellulitis of left lower limb: Secondary | ICD-10-CM

## 2022-02-04 DIAGNOSIS — Z87891 Personal history of nicotine dependence: Secondary | ICD-10-CM

## 2022-02-04 NOTE — Assessment & Plan Note (Signed)
Slightly up today Appreciate his PCP f/u.

## 2022-02-04 NOTE — Assessment & Plan Note (Signed)
Has resolved His wound is clean.  He will resume his daily Pen G.  I let him know he can message me via mychart at anytime.  Will plan to see him back in 1 year ("if not sooner" in his words)

## 2022-02-04 NOTE — Progress Notes (Signed)
   Subjective:    Patient ID: Corey Wilcox, male    DOB: [birth year], 46 y.o.   MRN: [id number]  HPI  46 yo M with hx of morbid obesity, fatty liver, who was admitted in Nov 2015 with sepsis and cellulitis.   Her returned 2-28 to 11-06-14 with the same. He was treated with vanco/levaquin due to a severe PEN allergy. He was tapered to vanco alone then to doxy. He was given this for 10 days then changed to low dose Clinda. His doppler was (-).   He was seen by allergy and had PEN challenge. Changed to PEN prophylaxis 2017.  He has continued on this.    On 01-08-22 he had worsening pain and redness in his L leg.  He was given a rx for doxy and his leg improved.  He did mostly well with this- mild GI upset, mild light sensitivity.  Otherwise his leg has improved and is back to baseline.  He has a wound from injury from a car door- 1-2 weeks ago. Healing well.  No f/c  Review of Systems Please see HPI. All other systems reviewed and negative.     Objective:   Physical Exam Vitals reviewed.  Constitutional:      Appearance: Normal appearance. He is obese.  Musculoskeletal:     Comments: His LLE swelling is back at baseline. There is no erythema or heat or tenderness.  His wound is clean and there is no d/c.   Neurological:     Mental Status: He is alert.           Assessment & Plan:

## 2022-02-04 NOTE — Assessment & Plan Note (Signed)
Encouraged him to work on wt loss.  Defer to PCP on wegovy?

## 2022-03-01 ENCOUNTER — Telehealth: Payer: Self-pay

## 2022-05-05 ENCOUNTER — Ambulatory Visit (INDEPENDENT_AMBULATORY_CARE_PROVIDER_SITE_OTHER): Payer: Self-pay | Admitting: Family Medicine

## 2022-05-05 DIAGNOSIS — Z1331 Encounter for screening for depression: Secondary | ICD-10-CM

## 2022-05-05 DIAGNOSIS — R635 Abnormal weight gain: Secondary | ICD-10-CM

## 2022-05-05 DIAGNOSIS — Q5564 Hidden penis: Secondary | ICD-10-CM

## 2022-05-05 NOTE — Progress Notes (Signed)
Subjective: CC: Morbid obesity PCP: Raliegh Ip, DO IZT:IWPY Corey Wilcox is a 46 y.o. male presenting to clinic today for:  1.  Morbid obesity Patient continues to struggle with hidden penis.  He has been actively trying to work on weight loss and is down 14 pounds since our last visit.  He identifies his weight as one of the issues but wonders if he has low testosterone.  He would like to proceed with testosterone levels.  He is reluctant to do any injection therapy but would be amenable to topical therapy if this is found to be low.  He otherwise would like to proceed with GLP but again is not comfortable with injection.  2.  Reactive depression He has worry and concern about the above.  His relationship is progressing and he is very self-conscious about this.  He does not wish to go back on antidepressants at this time   ROS: Per HPI  Allergies  Allergen Reactions   Cephalexin Nausea And Vomiting   Hydrocodone-Acetaminophen     Patient does not want. Seems to make pain worse   Past Medical History:  Diagnosis Date   Essential hypertension, benign    Fatty liver    Hyperlipemia    Hyperplasia of prostate    Obesity    Other and unspecified hyperlipidemia     Current Outpatient Medications:    albuterol (VENTOLIN HFA) 108 (90 Base) MCG/ACT inhaler, TAKE 2 PUFFS BY MOUTH EVERY 6 HOURS AS NEEDED FOR WHEEZE OR SHORTNESS OF BREATH, Disp: 8.5 each, Rfl: 2   olmesartan-hydrochlorothiazide (BENICAR HCT) 20-12.5 MG tablet, Take 1 tablet by mouth daily., Disp: 90 tablet, Rfl: 3   penicillin v potassium (VEETID) 500 MG tablet, Take 1 tablet (500 mg total) by mouth daily. Maint. drug, Disp: 120 tablet, Rfl: 3   simvastatin (ZOCOR) 10 MG tablet, TAKE 1 TABLET BY MOUTH EVERYDAY AT BEDTIME, Disp: 90 tablet, Rfl: 3   cetirizine (ZYRTEC) 10 MG tablet, Take 10 mg by mouth daily. (Patient not taking: Reported on 08/18/2021), Disp: , Rfl:    cholecalciferol (VITAMIN D) 1000 UNITS tablet,  Take 2,000 Units by mouth daily.  (Patient not taking: Reported on 08/18/2021), Disp: , Rfl:  Social History   Socioeconomic History   Marital status: Divorced    Spouse name: Not on file   Number of children: Not on file   Years of education: Not on file   Highest education level: Not on file  Occupational History   Occupation: Production designer, theatre/television/film  Tobacco Use   Smoking status: Former    Types: Cigarettes   Smokeless tobacco: Never   Tobacco comments:    smoked occasionally many years ago  Vaping Use   Vaping Use: Never used  Substance and Sexual Activity   Alcohol use: Yes    Alcohol/week: 0.0 standard drinks of alcohol    Comment: rarely   Drug use: No   Sexual activity: Not on file  Other Topics Concern   Not on file  Social History Narrative   Lives with sister   .     Social Determinants of Health   Financial Resource Strain: Not on file  Food Insecurity: Not on file  Transportation Needs: Not on file  Physical Activity: Not on file  Stress: Not on file  Social Connections: Not on file  Intimate Partner Violence: Not on file   Family History  Problem Relation Age of Onset   Heart attack Mother 53  100% occlusion of LAD   Stroke Father        cause of death   Atrial fibrillation Father     Objective: Office vital signs reviewed. BP 110/74   Pulse (!) 113   Temp 98.2 F (36.8 C) (Temporal)   Resp 20   Ht 5\' 9"  (1.753 m)   Wt (!) 413 lb (187.3 kg)   SpO2 94%   BMI 60.99 kg/m   Physical Examination:  General: Awake, alert, super morbidly obese, No acute distress Psych: Emotional during today's visit.  He is very pleasant and interactive     01/22/2022    3:24 PM 06/02/2021    1:40 PM 02/10/2021    9:52 AM  Depression screen PHQ 2/9  Decreased Interest 1 1 0  Down, Depressed, Hopeless 1 1 1   PHQ - 2 Score 2 2 1   Altered sleeping 1 1   Tired, decreased energy 1 1   Change in appetite 0 0   Feeling bad or failure about yourself  1 1   Trouble  concentrating 1 1   Moving slowly or fidgety/restless 0 0   Suicidal thoughts 0 0   PHQ-9 Score 6 6   Difficult doing work/chores Somewhat difficult Somewhat difficult       01/22/2022    3:24 PM 06/02/2021    1:41 PM 01/07/2020    9:13 AM 07/13/2019    9:09 AM  GAD 7 : Generalized Anxiety Score  Nervous, Anxious, on Edge 1 1 1  0  Control/stop worrying 1 1 1  0  Worry too much - different things 2 1 1  0  Trouble relaxing 1 1 0 0  Restless 1 1 0 0  Easily annoyed or irritable 1 1 0 0  Afraid - awful might happen 1 1 0 0  Total GAD 7 Score 8 7 3  0  Anxiety Difficulty Somewhat difficult Somewhat difficult        Assessment/ Plan: 46 y.o. male   Morbid obesity due to excess calories (HCC)  Hidden penis - Plan: Testosterone  Positive screening for depression on 9-item Patient Health Questionnaire (PHQ-9)  We discussed weight loss.  I think this patient would be a good candidate for GLP.  Unfortunately he is not insured so we may need to consider compounding pharmacy for semaglutide with B12.  No apparent contraindications to use of medicine.  First we will look for testosterone levels to see if perhaps this may be the cause of his symptoms.  Future order placed.  He understands that he has to come in between 8 and 10 to have these drawn.  Further treatment plan pending these results  Patient declined starting any antidepressants today despite positive PHQ  No orders of the defined types were placed in this encounter.  No orders of the defined types were placed in this encounter.    03/08/2020, DO [facility] [phone]

## 2022-05-06 ENCOUNTER — Telehealth: Payer: Self-pay | Admitting: Infectious Diseases

## 2022-05-06 DIAGNOSIS — L03116 Cellulitis of left lower limb: Secondary | ICD-10-CM

## 2022-05-06 MED ORDER — DOXYCYCLINE HYCLATE 100 MG PO TBEC: 100.0000 mg | DELAYED_RELEASE_TABLET | Freq: Two times a day (BID) | ORAL | 0 refills | Status: DC

## 2022-05-06 NOTE — Telephone Encounter (Signed)
Pt texted with worsening cellulitis.  Will refill his doxy. Pharmacy called.

## 2022-05-12 ENCOUNTER — Other Ambulatory Visit: Payer: Self-pay

## 2022-05-12 DIAGNOSIS — Q5564 Hidden penis: Secondary | ICD-10-CM

## 2022-05-13 LAB — TESTOSTERONE: Testosterone: 369 ng/dL (ref 264–916)

## 2022-08-25 ENCOUNTER — Ambulatory Visit: Payer: Medicaid Other | Admitting: Internal Medicine

## 2022-09-09 ENCOUNTER — Other Ambulatory Visit: Payer: Self-pay | Admitting: Infectious Diseases

## 2022-09-09 DIAGNOSIS — L03116 Cellulitis of left lower limb: Secondary | ICD-10-CM

## 2023-01-14 ENCOUNTER — Other Ambulatory Visit: Payer: Self-pay | Admitting: Family Medicine

## 2023-01-14 DIAGNOSIS — I10 Essential (primary) hypertension: Secondary | ICD-10-CM

## 2023-01-17 ENCOUNTER — Encounter: Payer: Self-pay | Admitting: Family Medicine

## 2023-01-17 NOTE — Telephone Encounter (Signed)
[facility] NTBS last OV 01/22/22 30 days given

## 2023-01-17 NOTE — Telephone Encounter (Signed)
Left message for pt to call back and schedule appt with Dr Nadine Counts. I will send letter

## 2023-02-12 ENCOUNTER — Other Ambulatory Visit: Payer: Self-pay | Admitting: Family Medicine

## 2023-02-12 DIAGNOSIS — E7800 Pure hypercholesterolemia, unspecified: Secondary | ICD-10-CM

## 2023-02-23 ENCOUNTER — Ambulatory Visit: Payer: Medicaid Other | Admitting: Family Medicine

## 2023-02-23 ENCOUNTER — Encounter: Payer: Self-pay | Admitting: Family Medicine

## 2023-02-23 VITALS — BP 138/86 | HR 114 | Temp 98.3°F | Ht 69.0 in | Wt 390.0 lb

## 2023-02-23 DIAGNOSIS — L239 Allergic contact dermatitis, unspecified cause: Secondary | ICD-10-CM | POA: Diagnosis not present

## 2023-02-23 DIAGNOSIS — I10 Essential (primary) hypertension: Secondary | ICD-10-CM

## 2023-02-23 DIAGNOSIS — E7800 Pure hypercholesterolemia, unspecified: Secondary | ICD-10-CM

## 2023-02-23 DIAGNOSIS — R7301 Impaired fasting glucose: Secondary | ICD-10-CM | POA: Diagnosis not present

## 2023-02-23 DIAGNOSIS — N400 Enlarged prostate without lower urinary tract symptoms: Secondary | ICD-10-CM

## 2023-02-23 DIAGNOSIS — Z6841 Body Mass Index (BMI) 40.0 and over, adult: Secondary | ICD-10-CM | POA: Diagnosis not present

## 2023-02-23 LAB — BAYER DCA HB A1C WAIVED: HB A1C (BAYER DCA - WAIVED): 5.5 % (ref 4.8–5.6)

## 2023-02-23 MED ORDER — OLMESARTAN MEDOXOMIL-HCTZ 20-12.5 MG PO TABS: 1.0000 | ORAL_TABLET | Freq: Every day | ORAL | 3 refills | Status: DC

## 2023-02-23 MED ORDER — TRIAMCINOLONE ACETONIDE 0.1 % EX CREA
1.0000 | TOPICAL_CREAM | Freq: Two times a day (BID) | CUTANEOUS | 0 refills | Status: DC | PRN
Start: 2023-02-23 — End: 2023-12-21

## 2023-02-23 MED ORDER — SIMVASTATIN 10 MG PO TABS: 10.0000 mg | ORAL_TABLET | Freq: Every day | ORAL | 3 refills | Status: DC

## 2023-02-23 NOTE — Progress Notes (Signed)
Subjective: CC: Hypertension, hyperlipidemia PCP: Raliegh Ip, DO Corey Wilcox is a 47 y.o. male presenting to clinic today for:  1.  Hypertension with hyperlipidemia, impaired fasting glucose associated with super morbid obesity Patient continues to struggle with his weight.  He has tried increasing physical exercise but admits that his legs give out easily on him and he becomes short of breath with prolonged exertion.  He is embarrassed to talk about these issues but feels like it is time to go ahead and proceed with referral to bariatric surgery to discuss options.  He is interested in potentially doing the Roux-en-Y gastric bypass.  He reports no chest pain.  No significant edema of the legs today.  2.  Rash Patient reports that he has had a rash along the left side of his neck.  Not sure what triggered it.  It comes and goes.  It is itchy.  ROS: Per HPI  Allergies  Allergen Reactions   Cephalexin Nausea And Vomiting   Hydrocodone-Acetaminophen     Patient does not want. Seems to make pain worse   Past Medical History:  Diagnosis Date   Essential hypertension, benign    Fatty liver    Hyperlipemia    Hyperplasia of prostate    Obesity    Other and unspecified hyperlipidemia     Current Outpatient Medications:    albuterol (VENTOLIN HFA) 108 (90 Base) MCG/ACT inhaler, TAKE 2 PUFFS BY MOUTH EVERY 6 HOURS AS NEEDED FOR WHEEZE OR SHORTNESS OF BREATH, Disp: 8.5 each, Rfl: 2   cetirizine (ZYRTEC) 10 MG tablet, Take 10 mg by mouth daily., Disp: , Rfl:    cholecalciferol (VITAMIN D) 1000 UNITS tablet, Take 2,000 Units by mouth daily., Disp: , Rfl:    doxycycline (DORYX) 100 MG EC tablet, Take 1 tablet (100 mg total) by mouth 2 (two) times daily., Disp: 28 tablet, Rfl: 0   olmesartan-hydrochlorothiazide (BENICAR HCT) 20-12.5 MG tablet, Take 1 tablet by mouth daily., Disp: 90 tablet, Rfl: 3   penicillin v potassium (VEETID) 500 MG tablet, TAKE 1 TABLET (500 MG TOTAL)  BY MOUTH DAILY. MAINT. DRUG, Disp: 90 tablet, Rfl: 5   simvastatin (ZOCOR) 10 MG tablet, Take 1 tablet (10 mg total) by mouth daily at 6 PM., Disp: 90 tablet, Rfl: 3 Social History   Socioeconomic History   Marital status: Divorced    Spouse name: Not on file   Number of children: Not on file   Years of education: Not on file   Highest education level: Not on file  Occupational History   Occupation: Production designer, theatre/television/film  Tobacco Use   Smoking status: Former    Types: Cigarettes   Smokeless tobacco: Never   Tobacco comments:    smoked occasionally many years ago  Vaping Use   Vaping Use: Never used  Substance and Sexual Activity   Alcohol use: Yes    Alcohol/week: 0.0 standard drinks of alcohol    Comment: rarely   Drug use: No   Sexual activity: Not on file  Other Topics Concern   Not on file  Social History Narrative   Lives with sister   .     Social Determinants of Health   Financial Resource Strain: Not on file  Food Insecurity: Not on file  Transportation Needs: Not on file  Physical Activity: Not on file  Stress: Not on file  Social Connections: Not on file  Intimate Partner Violence: Not on file   Family History  Problem Relation Age  of Onset   Heart attack Mother 41       100% occlusion of LAD   Stroke Father        cause of death   Atrial fibrillation Father     Objective: Office vital signs reviewed. BP 138/86   Pulse (!) 114   Temp 98.3 F (36.8 C)   Ht 5\' 9"  (1.753 m)   Wt (!) 390 lb (176.9 kg)   SpO2 90%   BMI 57.59 kg/m   Physical Examination:  General: Awake, alert, well nourished, No acute distress HEENT: sclera white, MMM Cardio: regular rate and rhythm, S1S2 heard, no murmurs appreciated Pulm: clear to auscultation bilaterally, no wheezes, rhonchi or rales; normal work of breathing on room air MSK: wide based gait and station Skin: erythematous patch of skin on left anterior chest along the clavicle. No exudates.   Assessment/ Plan: 47 y.o.  male   Essential hypertension, benign - Plan: CMP14+EGFR, olmesartan-hydrochlorothiazide (BENICAR HCT) 20-12.5 MG tablet, Amb Referral to Bariatric Surgery  Pure hypercholesterolemia - Plan: CMP14+EGFR, LDL Cholesterol, Direct, simvastatin (ZOCOR) 10 MG tablet, Amb Referral to Bariatric Surgery  Morbid obesity due to excess calories (HCC) - Plan: TSH, Bayer DCA Hb A1c Waived, Amb Referral to Bariatric Surgery  BMI 50.0-59.9, adult (HCC) - Plan: Amb Referral to Bariatric Surgery  Impaired fasting glucose - Plan: Bayer DCA Hb A1c Waived, Amb Referral to Bariatric Surgery  Hyperplasia of prostate - Plan: PSA  Allergic contact dermatitis, unspecified trigger - Plan: triamcinolone cream (KENALOG) 0.1 %  I have placed referral to bariatric surgery for discussion of Roux-en-Y gastric bypass.  I think this to be the best avenue of treatment of his obesity at this point.  We discussed alternative therapies including GLP, GIP, intensive nutrition intervention but he is really tried a lot of lifestyle modification in the past and failed to maintain weight.  Given his BMI he is at extreme high risk of sudden death  A1c did not demonstrate diabetes.  Will check renal function, liver enzymes.  Continue blood pressure and cholesterol medications.  Check PSA that we did not discuss prostate symptoms today  Area of rash on the side of the neck appears to be contact in nature and so triamcinolone was provided to the patient.  Use only as needed and avoid face, axilla and groin region  Orders Placed This Encounter  Procedures   CMP14+EGFR   LDL Cholesterol, Direct   PSA   TSH   Bayer DCA Hb A1c Waived   Meds ordered this encounter  Medications   olmesartan-hydrochlorothiazide (BENICAR HCT) 20-12.5 MG tablet    Sig: Take 1 tablet by mouth daily.    Dispense:  90 tablet    Refill:  3   simvastatin (ZOCOR) 10 MG tablet    Sig: Take 1 tablet (10 mg total) by mouth daily at 6 PM.    Dispense:  90  tablet    Refill:  3   triamcinolone cream (KENALOG) 0.1 %    Sig: Apply 1 Application topically 2 (two) times daily as needed (rash (max 10 days per flare)).    Dispense:  80 g    Refill:  0     Jahnessa Vanduyn Hulen Skains, DO [facility] [phone]

## 2023-02-23 NOTE — Patient Instructions (Addendum)
[facility] has additional information re: bariatric surgery:   [email or web address]

## 2023-02-24 LAB — CMP14+EGFR
ALT: 50 IU/L — ABNORMAL HIGH (ref 0–44)
AST: 32 IU/L (ref 0–40)
Albumin: 4.1 g/dL (ref 4.1–5.1)
Alkaline Phosphatase: 56 IU/L (ref 44–121)
BUN/Creatinine Ratio: 13 (ref 9–20)
BUN: 13 mg/dL (ref 6–24)
Bilirubin Total: 0.4 mg/dL (ref 0.0–1.2)
CO2: 26 mmol/L (ref 20–29)
Calcium: 9.4 mg/dL (ref 8.7–10.2)
Chloride: 98 mmol/L (ref 96–106)
Creatinine, Ser: 0.97 mg/dL (ref 0.76–1.27)
Globulin, Total: 1.8 g/dL (ref 1.5–4.5)
Glucose: 105 mg/dL — ABNORMAL HIGH (ref 70–99)
Potassium: 3.8 mmol/L (ref 3.5–5.2)
Sodium: 142 mmol/L (ref 134–144)
Total Protein: 5.9 g/dL — ABNORMAL LOW (ref 6.0–8.5)
eGFR: 98 mL/min/{1.73_m2} (ref >59)

## 2023-02-24 LAB — PSA: Prostate Specific Ag, Serum: 0.2 ng/mL (ref 0.0–4.0)

## 2023-02-24 LAB — TSH: TSH: 2.22 u[IU]/mL (ref 0.450–4.500)

## 2023-02-24 LAB — LDL CHOLESTEROL, DIRECT: LDL Direct: 85 mg/dL (ref 0–99)

## 2023-02-28 ENCOUNTER — Encounter: Payer: Self-pay | Admitting: Family Medicine

## 2023-05-05 DIAGNOSIS — E785 Hyperlipidemia, unspecified: Secondary | ICD-10-CM | POA: Diagnosis not present

## 2023-05-05 DIAGNOSIS — F332 Major depressive disorder, recurrent severe without psychotic features: Secondary | ICD-10-CM | POA: Diagnosis not present

## 2023-05-05 DIAGNOSIS — R0683 Snoring: Secondary | ICD-10-CM | POA: Diagnosis not present

## 2023-05-05 DIAGNOSIS — I10 Essential (primary) hypertension: Secondary | ICD-10-CM | POA: Diagnosis not present

## 2023-05-11 DIAGNOSIS — G4710 Hypersomnia, unspecified: Secondary | ICD-10-CM | POA: Diagnosis not present

## 2023-05-30 DIAGNOSIS — Z6841 Body Mass Index (BMI) 40.0 and over, adult: Secondary | ICD-10-CM | POA: Diagnosis not present

## 2023-06-01 DIAGNOSIS — G4733 Obstructive sleep apnea (adult) (pediatric): Secondary | ICD-10-CM | POA: Diagnosis not present

## 2023-06-05 ENCOUNTER — Other Ambulatory Visit: Payer: Self-pay | Admitting: Infectious Diseases

## 2023-06-09 DIAGNOSIS — G4733 Obstructive sleep apnea (adult) (pediatric): Secondary | ICD-10-CM | POA: Diagnosis not present

## 2023-06-09 DIAGNOSIS — Z6841 Body Mass Index (BMI) 40.0 and over, adult: Secondary | ICD-10-CM | POA: Diagnosis not present

## 2023-06-09 DIAGNOSIS — G4710 Hypersomnia, unspecified: Secondary | ICD-10-CM | POA: Diagnosis not present

## 2023-06-13 DIAGNOSIS — Z136 Encounter for screening for cardiovascular disorders: Secondary | ICD-10-CM | POA: Diagnosis not present

## 2023-06-13 DIAGNOSIS — Z87891 Personal history of nicotine dependence: Secondary | ICD-10-CM | POA: Diagnosis not present

## 2023-06-13 DIAGNOSIS — Z01818 Encounter for other preprocedural examination: Secondary | ICD-10-CM | POA: Diagnosis not present

## 2023-06-13 DIAGNOSIS — Z6841 Body Mass Index (BMI) 40.0 and over, adult: Secondary | ICD-10-CM | POA: Diagnosis not present

## 2023-06-17 DIAGNOSIS — Z6841 Body Mass Index (BMI) 40.0 and over, adult: Secondary | ICD-10-CM | POA: Diagnosis not present

## 2023-06-17 DIAGNOSIS — E66813 Obesity, class 3: Secondary | ICD-10-CM | POA: Diagnosis not present

## 2023-06-22 ENCOUNTER — Ambulatory Visit: Payer: Medicaid Other | Admitting: Family Medicine

## 2023-07-05 DIAGNOSIS — G4733 Obstructive sleep apnea (adult) (pediatric): Secondary | ICD-10-CM | POA: Diagnosis not present

## 2023-07-28 DIAGNOSIS — Z01818 Encounter for other preprocedural examination: Secondary | ICD-10-CM | POA: Diagnosis not present

## 2023-07-28 DIAGNOSIS — Z6841 Body Mass Index (BMI) 40.0 and over, adult: Secondary | ICD-10-CM | POA: Diagnosis not present

## 2023-07-28 DIAGNOSIS — Z87891 Personal history of nicotine dependence: Secondary | ICD-10-CM | POA: Diagnosis not present

## 2023-08-22 DIAGNOSIS — Z6841 Body Mass Index (BMI) 40.0 and over, adult: Secondary | ICD-10-CM | POA: Diagnosis not present

## 2023-08-22 DIAGNOSIS — G4733 Obstructive sleep apnea (adult) (pediatric): Secondary | ICD-10-CM | POA: Diagnosis not present

## 2023-08-22 DIAGNOSIS — R058 Other specified cough: Secondary | ICD-10-CM | POA: Diagnosis not present

## 2023-08-22 DIAGNOSIS — R062 Wheezing: Secondary | ICD-10-CM | POA: Diagnosis not present

## 2023-08-24 ENCOUNTER — Ambulatory Visit: Payer: Medicaid Other | Admitting: Family Medicine

## 2023-08-24 ENCOUNTER — Encounter: Payer: Self-pay | Admitting: Family Medicine

## 2023-08-24 DIAGNOSIS — K760 Fatty (change of) liver, not elsewhere classified: Secondary | ICD-10-CM

## 2023-08-24 DIAGNOSIS — G4733 Obstructive sleep apnea (adult) (pediatric): Secondary | ICD-10-CM | POA: Diagnosis not present

## 2023-08-24 DIAGNOSIS — N400 Enlarged prostate without lower urinary tract symptoms: Secondary | ICD-10-CM | POA: Diagnosis not present

## 2023-08-24 DIAGNOSIS — Z6841 Body Mass Index (BMI) 40.0 and over, adult: Secondary | ICD-10-CM

## 2023-08-24 DIAGNOSIS — E291 Testicular hypofunction: Secondary | ICD-10-CM | POA: Diagnosis not present

## 2023-08-24 DIAGNOSIS — Q5564 Hidden penis: Secondary | ICD-10-CM

## 2023-08-24 DIAGNOSIS — R739 Hyperglycemia, unspecified: Secondary | ICD-10-CM | POA: Diagnosis not present

## 2023-08-24 NOTE — Progress Notes (Signed)
Subjective: CC: Follow-up obesity PCP: Raliegh Ip, DO ZOX:WRUE Corey Wilcox is a 47 y.o. male presenting to clinic today for:  1.  Morbid obesity The patient here for interval checkup.  He has been under the care of bariatrics with plan for bariatric surgery in the near future.  He is completed really all of his office visits with the other specialists but will be meeting with the surgeon again in January to discuss next steps.  He continues to try and eat smaller, less frequent meals in efforts to lose weight.  He has been offered medical weight loss but he has declined and wants to continue 3 to diet and exercise alone.  He has had nutritional counseling.  2.  Hidden penis, history of prostate issues?, hypogonadism Patient is decided he would like to go ahead and see the urologist to ask a few more questions with regards to the issues that he has had with hypogonadism, hidden penis etc.   ROS: Per HPI  Allergies  Allergen Reactions   Cephalexin Nausea And Vomiting   Hydrocodone-Acetaminophen     Patient does not want. Seems to make pain worse   Past Medical History:  Diagnosis Date   Essential hypertension, benign    Fatty liver    Hyperlipemia    Hyperplasia of prostate    Obesity    Other and unspecified hyperlipidemia     Current Outpatient Medications:    albuterol (VENTOLIN HFA) 108 (90 Base) MCG/ACT inhaler, TAKE 2 PUFFS BY MOUTH EVERY 6 HOURS AS NEEDED FOR WHEEZE OR SHORTNESS OF BREATH, Disp: 8.5 each, Rfl: 2   cetirizine (ZYRTEC) 10 MG tablet, Take 10 mg by mouth daily., Disp: , Rfl:    cholecalciferol (VITAMIN D) 1000 UNITS tablet, Take 2,000 Units by mouth daily., Disp: , Rfl:    doxycycline (VIBRAMYCIN) 100 MG capsule, TAKE 1 CAPSULE BY MOUTH TWICE DAILY FOR 14 DAYS, Disp: 28 capsule, Rfl: 1   olmesartan-hydrochlorothiazide (BENICAR HCT) 20-12.5 MG tablet, Take 1 tablet by mouth daily., Disp: 90 tablet, Rfl: 3   penicillin v potassium (VEETID) 500 MG  tablet, TAKE 1 TABLET (500 MG TOTAL) BY MOUTH DAILY. MAINT. DRUG, Disp: 90 tablet, Rfl: 5   simvastatin (ZOCOR) 10 MG tablet, Take 1 tablet (10 mg total) by mouth daily at 6 PM., Disp: 90 tablet, Rfl: 3   triamcinolone cream (KENALOG) 0.1 %, Apply 1 Application topically 2 (two) times daily as needed (rash (max 10 days per flare))., Disp: 80 g, Rfl: 0 Social History   Socioeconomic History   Marital status: Divorced    Spouse name: Not on file   Number of children: Not on file   Years of education: Not on file   Highest education level: Not on file  Occupational History   Occupation: Production designer, theatre/television/film  Tobacco Use   Smoking status: Former    Types: Cigarettes   Smokeless tobacco: Never   Tobacco comments:    smoked occasionally many years ago  Vaping Use   Vaping status: Never Used  Substance and Sexual Activity   Alcohol use: Yes    Alcohol/week: 0.0 standard drinks of alcohol    Comment: rarely   Drug use: No   Sexual activity: Not on file  Other Topics Concern   Not on file  Social History Narrative   Lives with sister   .     Social Drivers of Health   Financial Resource Strain: Patient Declined (08/24/2023)   Overall Financial Resource Strain (CARDIA)  Difficulty of Paying Living Expenses: Patient declined  Food Insecurity: Patient Declined (08/24/2023)   Hunger Vital Sign    Worried About Running Out of Food in the Last Year: Patient declined    Ran Out of Food in the Last Year: Patient declined  Transportation Needs: No Transportation Needs (05/05/2023)   Received from [facility] - Transportation    Lack of Transportation (Medical): No    Lack of Transportation (Non-Medical): No  Physical Activity: Unknown (08/24/2023)   Exercise Vital Sign    Days of Exercise per Week: Patient declined    Minutes of Exercise per Session: Not on file  Stress: Patient Declined (08/24/2023)   [employer] of Occupational Health - Occupational Stress Questionnaire     Feeling of Stress : Patient declined  Social Connections: Unknown (08/24/2023)   Social Connection and Isolation Panel [NHANES]    Frequency of Communication with Friends and Family: Patient declined    Frequency of Social Gatherings with Friends and Family: Patient declined    Attends Religious Services: Patient declined    Database administrator or Organizations: Patient declined    Attends Banker Meetings: Not on file    Marital Status: Patient declined  Intimate Partner Violence: Unknown (05/02/2023)   Received from [facility]   HITS    Physically Hurt: Not on file    Insult or Talk Down To: Not on file    Threaten Physical Harm: Not on file    Scream or Curse: Not on file   Family History  Problem Relation Age of Onset   Heart attack Mother 73       100% occlusion of LAD   Stroke Father        cause of death   Atrial fibrillation Father     Objective: Office vital signs reviewed. BP 115/64   Pulse 91   Temp 98.6 F (37 C)   Ht 5\' 9"  (1.753 m)   Wt (!) 378 lb (171.5 kg)   SpO2 93%   BMI 55.82 kg/m   Physical Examination:  General: Awake, alert, super morbidly obese, No acute distress HEENT: sclera white, MMM Cardio: regular rate and rhythm, S1S2 heard, no murmurs appreciated Pulm: clear to auscultation bilaterally, no wheezes, rhonchi or rales; normal work of breathing on room air    Assessment/ Plan: 47 y.o. 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  Will get letter sent to [facility]. Given to [facility] for filing.  Referral to urology for further evaluation of penile and testosterone issues   Marcelina Mclaurin Hulen Skains, DO [facility] [phone]

## 2023-09-11 ENCOUNTER — Other Ambulatory Visit: Payer: Self-pay | Admitting: Infectious Diseases

## 2023-09-11 DIAGNOSIS — L03116 Cellulitis of left lower limb: Secondary | ICD-10-CM

## 2023-09-19 ENCOUNTER — Ambulatory Visit: Payer: Medicaid Other | Admitting: Family Medicine

## 2023-09-27 DIAGNOSIS — I10 Essential (primary) hypertension: Secondary | ICD-10-CM | POA: Diagnosis not present

## 2023-09-27 DIAGNOSIS — K760 Fatty (change of) liver, not elsewhere classified: Secondary | ICD-10-CM | POA: Diagnosis not present

## 2023-09-27 DIAGNOSIS — G4730 Sleep apnea, unspecified: Secondary | ICD-10-CM | POA: Diagnosis not present

## 2023-09-27 DIAGNOSIS — E785 Hyperlipidemia, unspecified: Secondary | ICD-10-CM | POA: Diagnosis not present

## 2023-10-05 DIAGNOSIS — G4733 Obstructive sleep apnea (adult) (pediatric): Secondary | ICD-10-CM | POA: Diagnosis not present

## 2023-10-24 DIAGNOSIS — Z01818 Encounter for other preprocedural examination: Secondary | ICD-10-CM | POA: Diagnosis not present

## 2023-10-24 DIAGNOSIS — Z87891 Personal history of nicotine dependence: Secondary | ICD-10-CM | POA: Diagnosis not present

## 2023-10-31 ENCOUNTER — Encounter: Payer: Self-pay | Admitting: *Deleted

## 2023-11-04 DIAGNOSIS — G4733 Obstructive sleep apnea (adult) (pediatric): Secondary | ICD-10-CM | POA: Diagnosis not present

## 2023-11-15 ENCOUNTER — Encounter: Payer: Self-pay | Admitting: Family Medicine

## 2023-11-15 ENCOUNTER — Ambulatory Visit: Admitting: Family Medicine

## 2023-11-15 VITALS — BP 124/71 | HR 100 | Temp 98.4°F | Ht 69.0 in | Wt 377.6 lb

## 2023-11-15 DIAGNOSIS — J329 Chronic sinusitis, unspecified: Secondary | ICD-10-CM

## 2023-11-15 MED ORDER — AZITHROMYCIN 250 MG PO TABS
ORAL_TABLET | ORAL | 0 refills | Status: DC
Start: 2023-11-15 — End: 2023-12-21

## 2023-11-15 NOTE — Progress Notes (Signed)
 Subjective: CC: URI PCP: Raliegh Ip, DO EXB:MWUX Loh is a 48 y.o. male presenting to clinic today for:  1.  URI Patient reports that he has had sinus drainage, fullness for over a week now.  He points to his frontal and maxillary sinuses as the area of discomfort.  He has had some postnasal drip, nasal congestion.  Symptoms are worsening.  He denies any fevers, myalgia.  He has had some intermittent dizziness.  Utilizing Zyrtec and Flonase with little improvement in symptoms.  His mucus has now changed to a dark green color.   ROS: Per HPI  Allergies  Allergen Reactions   Cephalexin Nausea And Vomiting   Hydrocodone-Acetaminophen     Patient does not want. Seems to make pain worse   Past Medical History:  Diagnosis Date   Essential hypertension, benign    Fatty liver    Hyperlipemia    Hyperplasia of prostate    Obesity    Other and unspecified hyperlipidemia     Current Outpatient Medications:    albuterol (VENTOLIN HFA) 108 (90 Base) MCG/ACT inhaler, TAKE 2 PUFFS BY MOUTH EVERY 6 HOURS AS NEEDED FOR WHEEZE OR SHORTNESS OF BREATH, Disp: 8.5 each, Rfl: 2   cetirizine (ZYRTEC) 10 MG tablet, Take 10 mg by mouth daily., Disp: , Rfl:    cholecalciferol (VITAMIN D) 1000 UNITS tablet, Take 2,000 Units by mouth daily., Disp: , Rfl:    doxycycline (VIBRAMYCIN) 100 MG capsule, TAKE 1 CAPSULE BY MOUTH TWICE DAILY FOR 14 DAYS, Disp: 28 capsule, Rfl: 1   olmesartan-hydrochlorothiazide (BENICAR HCT) 20-12.5 MG tablet, Take 1 tablet by mouth daily., Disp: 90 tablet, Rfl: 3   penicillin v potassium (VEETID) 500 MG tablet, TAKE 1 TABLET (500 MG TOTAL) BY MOUTH DAILY. MAINT., Disp: 30 tablet, Rfl: 17   simvastatin (ZOCOR) 10 MG tablet, Take 1 tablet (10 mg total) by mouth daily at 6 PM., Disp: 90 tablet, Rfl: 3   triamcinolone cream (KENALOG) 0.1 %, Apply 1 Application topically 2 (two) times daily as needed (rash (max 10 days per flare))., Disp: 80 g, Rfl: 0 Social History    Socioeconomic History   Marital status: Divorced    Spouse name: Not on file   Number of children: Not on file   Years of education: Not on file   Highest education level: Not on file  Occupational History   Occupation: Production designer, theatre/television/film  Tobacco Use   Smoking status: Former    Types: Cigarettes   Smokeless tobacco: Never   Tobacco comments:    smoked occasionally many years ago  Vaping Use   Vaping status: Never Used  Substance and Sexual Activity   Alcohol use: Yes    Alcohol/week: 0.0 standard drinks of alcohol    Comment: rarely   Drug use: No   Sexual activity: Not on file  Other Topics Concern   Not on file  Social History Narrative   Lives with sister   .     Social Drivers of Corporate investment banker Strain: Low Risk  (09/27/2023)   Received from [facility]   Overall Financial Resource Strain (CARDIA)    Difficulty of Paying Living Expenses: Not very hard  Food Insecurity: No Food Insecurity (09/27/2023)   Received from [facility]   Hunger Vital Sign    Worried About Running Out of Food in the Last Year: Never true    Ran Out of Food in the Last Year: Never true  Transportation Needs: No  Transportation Needs (09/27/2023)   Received from [facility] - Transportation    Lack of Transportation (Medical): No    Lack of Transportation (Non-Medical): No  Physical Activity: Unknown (08/24/2023)   Exercise Vital Sign    Days of Exercise per Week: Patient declined    Minutes of Exercise per Session: Not on file  Stress: Patient Declined (08/24/2023)   [employer] of Occupational Health - Occupational Stress Questionnaire    Feeling of Stress : Patient declined  Social Connections: Unknown (08/24/2023)   Social Connection and Isolation Panel [NHANES]    Frequency of Communication with Friends and Family: Patient declined    Frequency of Social Gatherings with Friends and Family: Patient declined    Attends Religious Services: Patient declined     Database administrator or Organizations: Patient declined    Attends Banker Meetings: Not on file    Marital Status: Patient declined  Intimate Partner Violence: Unknown (05/02/2023)   Received from [facility]   HITS    Physically Hurt: Not on file    Insult or Talk Down To: Not on file    Threaten Physical Harm: Not on file    Scream or Curse: Not on file   Family History  Problem Relation Age of Onset   Heart attack Mother 32       100% occlusion of LAD   Stroke Father        cause of death   Atrial fibrillation Father     Objective: Office vital signs reviewed. BP 124/71   Pulse 100   Temp 98.4 F (36.9 C)   Ht 5\' 9"  (1.753 m)   Wt (!) 377 lb 9.6 oz (171.3 kg)   SpO2 94%   BMI 55.76 kg/m   Physical Examination:  General: Awake, alert, No acute distress HEENT: Normal    Neck: No masses palpated. No lymphadenopathy    Ears: Tympanic membranes intact, normal light reflex, no erythema, no bulging    Eyes: PERRLA, extraocular membranes intact, sclera white    Nose: nasal turbinates moist, no nasal discharge observed    Throat: moist mucus membranes, mild oropharyngeal erythema, no tonsillar exudate.  Airway is patent Cardio: regular rate and rhythm, S1S2 heard, no murmurs appreciated Pulm: clear to auscultation bilaterally, no wheezes, rhonchi or rales; normal work of breathing on room air  Assessment/ Plan: 48 y.o. male   Rhinosinusitis - Plan: azithromycin (ZITHROMAX Z-PAK) 250 MG tablet, CANCELED: Veritor Flu A/B Waived, CANCELED: Novel Coronavirus, NAA ([facility])  Suspect that he is starting to develop a secondary bacterial infection.  I have given him azithromycin as he is chronically on penicillin and as needed doxycycline for underlying infectious diseases   Dennys Traughber Hulen Skains, DO [facility] [phone]

## 2023-11-15 NOTE — Patient Instructions (Signed)

## 2023-11-16 DIAGNOSIS — R062 Wheezing: Secondary | ICD-10-CM | POA: Diagnosis not present

## 2023-11-16 DIAGNOSIS — Z6841 Body Mass Index (BMI) 40.0 and over, adult: Secondary | ICD-10-CM | POA: Diagnosis not present

## 2023-11-16 DIAGNOSIS — J3089 Other allergic rhinitis: Secondary | ICD-10-CM | POA: Diagnosis not present

## 2023-11-16 DIAGNOSIS — E66813 Obesity, class 3: Secondary | ICD-10-CM | POA: Diagnosis not present

## 2023-11-16 DIAGNOSIS — G4733 Obstructive sleep apnea (adult) (pediatric): Secondary | ICD-10-CM | POA: Diagnosis not present

## 2023-12-03 DIAGNOSIS — G4733 Obstructive sleep apnea (adult) (pediatric): Secondary | ICD-10-CM | POA: Diagnosis not present

## 2023-12-08 DIAGNOSIS — E785 Hyperlipidemia, unspecified: Secondary | ICD-10-CM | POA: Diagnosis not present

## 2023-12-08 DIAGNOSIS — G4730 Sleep apnea, unspecified: Secondary | ICD-10-CM | POA: Diagnosis not present

## 2023-12-08 DIAGNOSIS — Z6841 Body Mass Index (BMI) 40.0 and over, adult: Secondary | ICD-10-CM | POA: Diagnosis not present

## 2023-12-08 DIAGNOSIS — I10 Essential (primary) hypertension: Secondary | ICD-10-CM | POA: Diagnosis not present

## 2023-12-08 DIAGNOSIS — K760 Fatty (change of) liver, not elsewhere classified: Secondary | ICD-10-CM | POA: Diagnosis not present

## 2023-12-21 ENCOUNTER — Encounter: Payer: Self-pay | Admitting: Family Medicine

## 2023-12-21 ENCOUNTER — Ambulatory Visit: Payer: Medicaid Other | Admitting: Family Medicine

## 2023-12-21 VITALS — BP 117/75 | HR 94 | Ht 69.0 in | Wt 370.0 lb

## 2023-12-21 DIAGNOSIS — R0989 Other specified symptoms and signs involving the circulatory and respiratory systems: Secondary | ICD-10-CM | POA: Diagnosis not present

## 2023-12-21 DIAGNOSIS — Z6841 Body Mass Index (BMI) 40.0 and over, adult: Secondary | ICD-10-CM | POA: Diagnosis not present

## 2023-12-21 DIAGNOSIS — G4733 Obstructive sleep apnea (adult) (pediatric): Secondary | ICD-10-CM

## 2023-12-21 DIAGNOSIS — I10 Essential (primary) hypertension: Secondary | ICD-10-CM

## 2023-12-21 DIAGNOSIS — K08409 Partial loss of teeth, unspecified cause, unspecified class: Secondary | ICD-10-CM | POA: Diagnosis not present

## 2023-12-21 NOTE — Progress Notes (Signed)
 Subjective: CC: Hypertension, hyperlipidemia PCP: Raliegh Ip, DO ZOX:WRUE Corey Wilcox is a 48 y.o. male presenting to clinic today for:  1.  Hypertension, hyperlipidemia associate with morbid obesity.   Patient is compliant with all medications.  He continues to follow-up with bariatric surgery with an appointment with a surgeon coming up soon.  He is hoping that this will be the date that they will set a surgery date.  He is down 60 pounds since 2023.  He is seeing a counselor every 1 to 2 weeks and this is really been helpful.  2.  Sinus issue He reports that he started having some severe right sided ear pain and facial pain and was evaluated by an emergency dentist who extracted and impacted wisdom tooth.  However, he notes that the wisdom tooth was protruding into one of the sinuses and when he started irrigating the socket where the tooth was removed he would get flow into the nostril and it would drain from the nostril.  He wants to be referred to ENT because he is quite concerned about this.  Of note he is chronically treated with penicillin by infectious disease.  He reports no drainage, no fevers.  Still having some issues on the right ear but not sure if this is pathologic or not.  Also states that he is been having some right sided facial numbness since the procedure   ROS: Per HPI  Allergies  Allergen Reactions   Cephalexin Nausea And Vomiting   Hydrocodone-Acetaminophen     Patient does not want. Seems to make pain worse   Past Medical History:  Diagnosis Date   Essential hypertension, benign    Fatty liver    Hyperlipemia    Hyperplasia of prostate    Obesity    Other and unspecified hyperlipidemia     Current Outpatient Medications:    albuterol (VENTOLIN HFA) 108 (90 Base) MCG/ACT inhaler, TAKE 2 PUFFS BY MOUTH EVERY 6 HOURS AS NEEDED FOR WHEEZE OR SHORTNESS OF BREATH, Disp: 8.5 each, Rfl: 2   cetirizine (ZYRTEC) 10 MG tablet, Take 10 mg by mouth daily.,  Disp: , Rfl:    cholecalciferol (VITAMIN D) 1000 UNITS tablet, Take 2,000 Units by mouth daily., Disp: , Rfl:    ibuprofen (ADVIL) 800 MG tablet, Take 800 mg by mouth every 8 (eight) hours as needed., Disp: , Rfl:    Misc. Devices MISC, Start CPAP at 14 cm. water pressure.  Prefer Resmed S11 CPAP machine with a mask, supplies and heated tubing for severe OSA with AHI 49.  Please use a mask of patient preference.  Send to a local DME., Disp: , Rfl:    olmesartan-hydrochlorothiazide (BENICAR HCT) 20-12.5 MG tablet, Take 1 tablet by mouth daily., Disp: 90 tablet, Rfl: 3   penicillin v potassium (VEETID) 500 MG tablet, TAKE 1 TABLET (500 MG TOTAL) BY MOUTH DAILY. MAINT., Disp: 30 tablet, Rfl: 17   simvastatin (ZOCOR) 10 MG tablet, Take 1 tablet (10 mg total) by mouth daily at 6 PM., Disp: 90 tablet, Rfl: 3   traMADol (ULTRAM) 50 MG tablet, Take 50 mg by mouth every 8 (eight) hours as needed., Disp: , Rfl:  Social History   Socioeconomic History   Marital status: Divorced    Spouse name: Not on file   Number of children: Not on file   Years of education: Not on file   Highest education level: Not on file  Occupational History   Occupation: Production designer, theatre/television/film  Tobacco Use  Smoking status: Former    Types: Cigarettes   Smokeless tobacco: Never   Tobacco comments:    smoked occasionally many years ago  Vaping Use   Vaping status: Never Used  Substance and Sexual Activity   Alcohol use: Yes    Alcohol/week: 0.0 standard drinks of alcohol    Comment: rarely   Drug use: No   Sexual activity: Not on file  Other Topics Concern   Not on file  Social History Narrative   Lives with sister   .     Social Drivers of Corporate investment banker Strain: Low Risk  (09/27/2023)   Received from [facility]   Overall Financial Resource Strain (CARDIA)    Difficulty of Paying Living Expenses: Not very hard  Food Insecurity: No Food Insecurity (09/27/2023)   Received from [facility]   Hunger Vital Sign     Worried About Running Out of Food in the Last Year: Never true    Ran Out of Food in the Last Year: Never true  Transportation Needs: No Transportation Needs (09/27/2023)   Received from [facility] - Transportation    Lack of Transportation (Medical): No    Lack of Transportation (Non-Medical): No  Physical Activity: Unknown (08/24/2023)   Exercise Vital Sign    Days of Exercise per Week: Patient declined    Minutes of Exercise per Session: Not on file  Stress: Patient Declined (08/24/2023)   [employer] of Occupational Health - Occupational Stress Questionnaire    Feeling of Stress : Patient declined  Social Connections: Unknown (08/24/2023)   Social Connection and Isolation Panel [NHANES]    Frequency of Communication with Friends and Family: Patient declined    Frequency of Social Gatherings with Friends and Family: Patient declined    Attends Religious Services: Patient declined    Database administrator or Organizations: Patient declined    Attends Banker Meetings: Not on file    Marital Status: Patient declined  Intimate Partner Violence: Unknown (05/02/2023)   Received from [facility]   HITS    Physically Hurt: Not on file    Insult or Talk Down To: Not on file    Threaten Physical Harm: Not on file    Scream or Curse: Not on file   Family History  Problem Relation Age of Onset   Heart attack Mother 61       100% occlusion of LAD   Stroke Father        cause of death   Atrial fibrillation Father     Objective: Office vital signs reviewed. BP 117/75   Pulse 94   Ht 5\' 9"  (1.753 m)   Wt (!) 370 lb (167.8 kg)   SpO2 93%   BMI 54.64 kg/m   Physical Examination:  General: Awake, alert, morbidly obese, No acute distress HEENT: TMs intact bilaterally.  Right TM with what seems to be scarring.  No bulging, erythema or perforation appreciated.  I evaluated the upper area of dental extraction in the posterior mouth on the right and  I could not appreciate any gross drainage, induration etc.  He did feel like he had an empty socket but exam was limited and I could not really see a deep hole Cardio: regular rate and rhythm, S1S2 heard, no murmurs appreciated Pulm: clear to auscultation bilaterally, no wheezes, rhonchi or rales; normal work of breathing on room air   Assessment/ Plan: 48 y.o. male   Morbid  obesity with BMI of 50.0-59.9, adult (HCC)  Essential hypertension, benign  OSA (obstructive sleep apnea)  Sinus complaint - Plan: Ambulatory referral to ENT  History of third molar tooth extraction, unspecified edentulism class - Plan: Ambulatory referral to ENT  He has really made some strides over the last couple of years with weight loss.  I am hopeful that he can proceed with his weight loss surgery.  His blood pressure is well-controlled.  Continue CPAP therapy  I have referred him urgently to ENT for sinus complaint.  If he does have tunneling from the oral cavity to the sinus, this will most certainly need to be addressed.  He is prophylactically treated with penicillin so hopefully this will protect him while he is awaiting evaluation.  We discussed red flag signs and symptoms and reasons for reevaluation.  He voiced good understanding and will follow-up as needed   Eliodoro Guerin, DO [facility] [phone]

## 2023-12-22 DIAGNOSIS — I10 Essential (primary) hypertension: Secondary | ICD-10-CM | POA: Diagnosis not present

## 2023-12-22 DIAGNOSIS — Z8052 Family history of malignant neoplasm of bladder: Secondary | ICD-10-CM | POA: Diagnosis not present

## 2023-12-22 DIAGNOSIS — R319 Hematuria, unspecified: Secondary | ICD-10-CM | POA: Diagnosis not present

## 2023-12-22 DIAGNOSIS — Q5564 Hidden penis: Secondary | ICD-10-CM | POA: Diagnosis not present

## 2023-12-23 ENCOUNTER — Ambulatory Visit: Payer: Medicaid Other | Admitting: Family Medicine

## 2024-01-03 DIAGNOSIS — G4733 Obstructive sleep apnea (adult) (pediatric): Secondary | ICD-10-CM | POA: Diagnosis not present

## 2024-01-10 DIAGNOSIS — G4733 Obstructive sleep apnea (adult) (pediatric): Secondary | ICD-10-CM | POA: Diagnosis not present

## 2024-02-02 DIAGNOSIS — G4733 Obstructive sleep apnea (adult) (pediatric): Secondary | ICD-10-CM | POA: Diagnosis not present

## 2024-02-04 ENCOUNTER — Other Ambulatory Visit: Payer: Self-pay | Admitting: Family Medicine

## 2024-02-04 DIAGNOSIS — I10 Essential (primary) hypertension: Secondary | ICD-10-CM

## 2024-02-06 DIAGNOSIS — K760 Fatty (change of) liver, not elsewhere classified: Secondary | ICD-10-CM | POA: Diagnosis not present

## 2024-02-06 DIAGNOSIS — G4730 Sleep apnea, unspecified: Secondary | ICD-10-CM | POA: Diagnosis not present

## 2024-02-06 DIAGNOSIS — E785 Hyperlipidemia, unspecified: Secondary | ICD-10-CM | POA: Diagnosis not present

## 2024-02-06 DIAGNOSIS — Z6841 Body Mass Index (BMI) 40.0 and over, adult: Secondary | ICD-10-CM | POA: Diagnosis not present

## 2024-02-06 DIAGNOSIS — I10 Essential (primary) hypertension: Secondary | ICD-10-CM | POA: Diagnosis not present

## 2024-02-09 DIAGNOSIS — Z6841 Body Mass Index (BMI) 40.0 and over, adult: Secondary | ICD-10-CM | POA: Diagnosis not present

## 2024-02-10 DIAGNOSIS — Z6841 Body Mass Index (BMI) 40.0 and over, adult: Secondary | ICD-10-CM | POA: Diagnosis not present

## 2024-02-10 DIAGNOSIS — I10 Essential (primary) hypertension: Secondary | ICD-10-CM | POA: Diagnosis not present

## 2024-02-10 DIAGNOSIS — K760 Fatty (change of) liver, not elsewhere classified: Secondary | ICD-10-CM | POA: Diagnosis not present

## 2024-02-10 DIAGNOSIS — Z01818 Encounter for other preprocedural examination: Secondary | ICD-10-CM | POA: Diagnosis not present

## 2024-02-10 DIAGNOSIS — G4730 Sleep apnea, unspecified: Secondary | ICD-10-CM | POA: Diagnosis not present

## 2024-02-10 DIAGNOSIS — E785 Hyperlipidemia, unspecified: Secondary | ICD-10-CM | POA: Diagnosis not present

## 2024-02-14 DIAGNOSIS — R319 Hematuria, unspecified: Secondary | ICD-10-CM | POA: Diagnosis not present

## 2024-02-16 DIAGNOSIS — Z6841 Body Mass Index (BMI) 40.0 and over, adult: Secondary | ICD-10-CM | POA: Diagnosis not present

## 2024-02-28 DIAGNOSIS — Z6841 Body Mass Index (BMI) 40.0 and over, adult: Secondary | ICD-10-CM | POA: Diagnosis not present

## 2024-02-28 DIAGNOSIS — G4730 Sleep apnea, unspecified: Secondary | ICD-10-CM | POA: Diagnosis not present

## 2024-02-28 DIAGNOSIS — I10 Essential (primary) hypertension: Secondary | ICD-10-CM | POA: Diagnosis not present

## 2024-02-28 DIAGNOSIS — K760 Fatty (change of) liver, not elsewhere classified: Secondary | ICD-10-CM | POA: Diagnosis not present

## 2024-02-28 DIAGNOSIS — E785 Hyperlipidemia, unspecified: Secondary | ICD-10-CM | POA: Diagnosis not present

## 2024-03-01 ENCOUNTER — Encounter (INDEPENDENT_AMBULATORY_CARE_PROVIDER_SITE_OTHER): Payer: Self-pay | Admitting: Otolaryngology

## 2024-03-01 ENCOUNTER — Ambulatory Visit (INDEPENDENT_AMBULATORY_CARE_PROVIDER_SITE_OTHER): Admitting: Otolaryngology

## 2024-03-01 VITALS — BP 117/72

## 2024-03-01 DIAGNOSIS — R0981 Nasal congestion: Secondary | ICD-10-CM

## 2024-03-01 DIAGNOSIS — J329 Chronic sinusitis, unspecified: Secondary | ICD-10-CM

## 2024-03-01 DIAGNOSIS — J3089 Other allergic rhinitis: Secondary | ICD-10-CM

## 2024-03-01 DIAGNOSIS — J342 Deviated nasal septum: Secondary | ICD-10-CM

## 2024-03-01 DIAGNOSIS — J343 Hypertrophy of nasal turbinates: Secondary | ICD-10-CM | POA: Diagnosis not present

## 2024-03-01 DIAGNOSIS — R0982 Postnasal drip: Secondary | ICD-10-CM

## 2024-03-01 MED ORDER — FLUTICASONE PROPIONATE 50 MCG/ACT NA SUSP: 2.0000 | Freq: Every day | NASAL | 6 refills | Status: AC

## 2024-03-01 NOTE — Progress Notes (Signed)
 ENT CONSULT:  Reason for Consult: chronic nasal congestion  concern for oro-antral fistula after wisdom tooth extraction  HPI: Discussed the use of AI scribe software for clinical note transcription with the patient, who gave verbal consent to proceed.  History of Present Illness Corey Wilcox is a 48 year old male who presents with sinus issues and concern for an oro-antral fistula following a wisdom tooth extraction.  In March or April, he experienced severe oral pain, initially suspecting a sinus infection. He consulted his primary care physician in [place], which provided temporary relief. However, the pain returned, leading him to an emergency dentist where an impacted wisdom tooth was discovered (left maxillary wisdom tooth)  Following the wisdom tooth extraction, he noticed that during clean rinses, the liquid would travel through the incision and exit through his right nostril, raising concerns about a possible sinus perforation. He has had one sinus infection in the last twelve months, which he believes was related to the wisdom tooth issue.  He has a history of allergies, likely to dust and pollen, and is currently taking Zyrtec. He also uses a nasal spray, which he believes is a generic brand from [employer], similar to a topical nasal steroid. He reports a good sense of smell.  Records Reviewed:  [facility] Sleep 08/22/23 Assessment   1. Obstructive sleep apnea syndrome, severe  2. Snoring  3. Morbid obesity due to excess calories (*)  4. Wheeze   He has severe obstructive sleep apnea with an overall AHI of approximately 49. He agrees to initiate CPAP therapy with close follow up.  Plan   OSA, Severe CPAP at 14 cm water pressure setup with mask and supplies We discussed the importance of weight loss and daily exercise Do not drive while sleepy Minimize sedating medications Follow up in 8 weeks for CPAP compliance    Past Medical History:  Diagnosis Date   Essential  hypertension, benign    Fatty liver    Hyperlipemia    Hyperplasia of prostate    Obesity    Other and unspecified hyperlipidemia     Past Surgical History:  Procedure Laterality Date   tympanoplasty      Family History  Problem Relation Age of Onset   Heart attack Mother 73       100% occlusion of LAD   Stroke Father        cause of death   Atrial fibrillation Father     Social History:  reports that he has quit smoking. His smoking use included cigarettes. He has never used smokeless tobacco. He reports current alcohol use. He reports that he does not use drugs.  Allergies:  Allergies  Allergen Reactions   Cephalexin Nausea And Vomiting   Hydrocodone -Acetaminophen      Patient does not want. Seems to make pain worse    Medications: I have reviewed the patient's current medications.  The PMH, PSH, Medications, Allergies, and SH were reviewed and updated.  ROS: Constitutional: Negative for fever, weight loss and weight gain. Cardiovascular: Negative for chest pain and dyspnea on exertion. Respiratory: Is not experiencing shortness of breath at rest. Gastrointestinal: Negative for nausea and vomiting. Neurological: Negative for headaches. Psychiatric: The patient is not nervous/anxious  Blood pressure 117/72. There is no height or weight on file to calculate BMI.  PHYSICAL EXAM:  Exam: General: Well-developed, well-nourished Respiratory Respiratory effort: Equal inspiration and expiration without stridor Cardiovascular Peripheral Vascular: Warm extremities with equal color/perfusion Eyes: No nystagmus with equal extraocular motion bilaterally  Neuro/Psych/Balance: Patient oriented to person, place, and time; Appropriate mood and affect; Gait is intact with no imbalance; Cranial nerves I-XII are intact Head and Face Inspection: Normocephalic and atraumatic without mass or lesion Palpation: Facial skeleton intact without bony stepoffs Salivary Glands: No mass or  tenderness Facial Strength: Facial motility symmetric and full bilaterally ENT Pinna: External ear intact and fully developed External canal: Canal is patent with intact skin Tympanic Membrane: Clear and mobile External Nose: No scar or anatomic deformity Internal Nose: Septum is deviated to the left. No polyp, or purulence. Mucosal edema and erythema present.  Bilateral inferior turbinate hypertrophy.  Lips, Teeth, and gums: Mucosa and teeth intact and viable TMJ: No pain to palpation with full mobility Oral cavity/oropharynx: No erythema or exudate, no lesions present Nasopharynx: No mass or lesion with intact mucosa Neck Neck and Trachea: Midline trachea without mass or lesion Thyroid : No mass or nodularity Lymphatics: No lymphadenopathy  Procedure:   PROCEDURE NOTE: nasal endoscopy  Preoperative diagnosis: chronic sinusitis symptoms  Postoperative diagnosis: same  Procedure: Diagnostic nasal endoscopy (68768)  Surgeon: Elena Larry, M.D.  Anesthesia: Topical lidocaine and Afrin  H&P REVIEW: The patient's history and physical were reviewed today prior to procedure. All medications were reviewed and updated as well. Complications: None Condition is stable throughout exam Indications and consent: The patient presents with symptoms of chronic sinusitis not responding to previous therapies. All the risks, benefits, and potential complications were reviewed with the patient preoperatively and informed consent was obtained. The time out was completed with confirmation of the correct procedure.   Procedure: The patient was seated upright in the clinic. Topical lidocaine and Afrin were applied to the nasal cavity. After adequate anesthesia had occurred, the rigid nasal endoscope was passed into the nasal cavity. The nasal mucosa, turbinates, septum, and sinus drainage pathways were visualized bilaterally. This revealed no purulence or significant secretions that might be cultured.  There were no polyps or sites of significant inflammation. The mucosa was intact and there was no crusting present. The scope was then slowly withdrawn and the patient tolerated the procedure well. There were no complications or blood loss.     Assessment/Plan: Encounter Diagnoses  Name Primary?   Chronic sinusitis, unspecified location Yes   Chronic nasal congestion    Environmental and seasonal allergies    Nasal septal deviation    Hypertrophy of both inferior nasal turbinates    Post-nasal drip     Assessment and Plan Assessment & Plan Chronic Nasal congestion Environmental Allergie Nasal endoscopy with septal deviation to the left ITH, but no purulence or polyps. No evidence of oro-antral fistula on exam.  Cannot rule out CRS w/o scan, ordered Max/Face CT - CT Max/face - will call with results - Prescribed Flonase  twice daily. - Continue Zyrtec daily.       Thank you for allowing me to participate in the care of this patient. Please do not hesitate to contact me with any questions or concerns.   Elena Larry, MD Otolaryngology [facility] Phone: [phone] Fax: [phone]    03/01/2024, 2:15 PM

## 2024-03-03 ENCOUNTER — Other Ambulatory Visit: Payer: Self-pay | Admitting: Family Medicine

## 2024-03-03 DIAGNOSIS — E7800 Pure hypercholesterolemia, unspecified: Secondary | ICD-10-CM

## 2024-03-04 DIAGNOSIS — G4733 Obstructive sleep apnea (adult) (pediatric): Secondary | ICD-10-CM | POA: Diagnosis not present

## 2024-03-14 DIAGNOSIS — Z87891 Personal history of nicotine dependence: Secondary | ICD-10-CM | POA: Diagnosis not present

## 2024-03-14 DIAGNOSIS — K760 Fatty (change of) liver, not elsewhere classified: Secondary | ICD-10-CM | POA: Diagnosis not present

## 2024-03-14 DIAGNOSIS — Z6841 Body Mass Index (BMI) 40.0 and over, adult: Secondary | ICD-10-CM | POA: Diagnosis not present

## 2024-03-14 DIAGNOSIS — G4733 Obstructive sleep apnea (adult) (pediatric): Secondary | ICD-10-CM | POA: Diagnosis not present

## 2024-03-14 DIAGNOSIS — I10 Essential (primary) hypertension: Secondary | ICD-10-CM | POA: Diagnosis not present

## 2024-03-14 DIAGNOSIS — E785 Hyperlipidemia, unspecified: Secondary | ICD-10-CM | POA: Diagnosis not present

## 2024-03-19 ENCOUNTER — Ambulatory Visit: Admitting: Family Medicine

## 2024-03-19 ENCOUNTER — Encounter: Payer: Self-pay | Admitting: Family Medicine

## 2024-03-19 ENCOUNTER — Telehealth: Payer: Self-pay | Admitting: Family Medicine

## 2024-03-19 VITALS — BP 122/72 | HR 86 | Temp 98.6°F | Ht 69.0 in | Wt 344.0 lb

## 2024-03-19 DIAGNOSIS — L231 Allergic contact dermatitis due to adhesives: Secondary | ICD-10-CM

## 2024-03-19 DIAGNOSIS — I10 Essential (primary) hypertension: Secondary | ICD-10-CM

## 2024-03-19 DIAGNOSIS — G4733 Obstructive sleep apnea (adult) (pediatric): Secondary | ICD-10-CM | POA: Diagnosis not present

## 2024-03-19 DIAGNOSIS — Z6841 Body Mass Index (BMI) 40.0 and over, adult: Secondary | ICD-10-CM

## 2024-03-19 DIAGNOSIS — Z9884 Bariatric surgery status: Secondary | ICD-10-CM | POA: Diagnosis not present

## 2024-03-19 NOTE — Telephone Encounter (Signed)
 Patient scheduled on Jan 23rd DOD schedule with Dr. Jolinda for CPE 30 min

## 2024-03-19 NOTE — Telephone Encounter (Signed)
 Patient needs to come back   Return in about 6 months (around 09/19/2024) for Annual physical, Tetanus, colon cancer screening.and I can't get him until March. Please call patient with appt.

## 2024-03-19 NOTE — Progress Notes (Signed)
 Subjective: CC: Hospital discharge follow-up PCP: Jolinda Norene HERO, DO YEP:Corey Wilcox is a 48 y.o. male presenting to clinic today for:  1.  Status post laparoscopic sleeve gastrectomy Underwent surgery on 03/14/2024.  He reports that he was taken off of his home blood pressure medication and wants to know if this is okay.  He has been doing well overall since the surgery last week.  Surgical site seem to be healing well he will be following up with his surgeon on Wednesday for staple removal.  He reports no fevers.  No blood in stool.  He is having loose bowel movements but notes that he is on a pure liquid diet currently.  He is utilizing Tums and Pepto-Bismol several times per day as directed.  He has adequate help at home with his sister helping him.  He tries to stay up and get around.  He has been enjoying some ghost hunting which keeps him motivated to continue losing weight to pursue this passion.   ROS: Per HPI  Allergies  Allergen Reactions   Cephalexin Nausea And Vomiting   Hydrocodone -Acetaminophen      Patient does not want. Seems to make pain worse   Past Medical History:  Diagnosis Date   Essential hypertension, benign    Fatty liver    Hyperlipemia    Hyperplasia of prostate    Obesity    Other and unspecified hyperlipidemia     Current Outpatient Medications:    albuterol  (VENTOLIN  HFA) 108 (90 Base) MCG/ACT inhaler, TAKE 2 PUFFS BY MOUTH EVERY 6 HOURS AS NEEDED FOR WHEEZE OR SHORTNESS OF BREATH, Disp: 8.5 each, Rfl: 2   aprepitant (EMEND) 40 MG capsule, Take by mouth., Disp: , Rfl:    cetirizine (ZYRTEC) 10 MG tablet, Take 10 mg by mouth daily., Disp: , Rfl:    cholecalciferol (VITAMIN D ) 1000 UNITS tablet, Take 2,000 Units by mouth daily., Disp: , Rfl:    fluticasone  (FLONASE ) 50 MCG/ACT nasal spray, Place 2 sprays into both nostrils daily., Disp: 16 g, Rfl: 6   metoCLOPramide (REGLAN) 10 MG tablet, Take 1 tablet at 5am the morning of surgery and then  every 6 to 8 hours as needed for nausea., Disp: , Rfl:    Misc. Devices MISC, Start CPAP at 14 cm. water pressure.  Prefer Resmed S11 CPAP machine with a mask, supplies and heated tubing for severe OSA with AHI 49.  Please use a mask of patient preference.  Send to a local DME., Disp: , Rfl:    olmesartan -hydrochlorothiazide (BENICAR  HCT) 20-12.5 MG tablet, TAKE 1 TABLET BY MOUTH EVERY DAY, Disp: 90 tablet, Rfl: 0   omeprazole (PRILOSEC) 40 MG capsule, Take 40 mg by mouth., Disp: , Rfl:    ondansetron  (ZOFRAN -ODT) 8 MG disintegrating tablet, Take 8 mg by mouth every 8 (eight) hours as needed., Disp: , Rfl:    simvastatin  (ZOCOR ) 10 MG tablet, TAKE 1 TABLET (10 MG TOTAL) BY MOUTH DAILY AT 6 PM., Disp: 90 tablet, Rfl: 0   traMADol (ULTRAM) 50 MG tablet, Take 50 mg by mouth every 8 (eight) hours as needed., Disp: , Rfl:    doxycycline  (VIBRAMYCIN ) 100 MG capsule, Take 100 mg by mouth 2 (two) times daily. (Patient not taking: Reported on 03/19/2024), Disp: , Rfl:    ibuprofen (ADVIL) 800 MG tablet, Take 800 mg by mouth every 8 (eight) hours as needed., Disp: , Rfl:    penicillin  v potassium (VEETID) 500 MG tablet, TAKE 1 TABLET (500 MG TOTAL)  BY MOUTH DAILY. MAINT. (Patient not taking: Reported on 03/19/2024), Disp: 30 tablet, Rfl: 17 Social History   Socioeconomic History   Marital status: Divorced    Spouse name: Not on file   Number of children: Not on file   Years of education: Not on file   Highest education level: Not on file  Occupational History   Occupation: Production designer, theatre/television/film  Tobacco Use   Smoking status: Former    Types: Cigarettes   Smokeless tobacco: Never   Tobacco comments:    smoked occasionally many years ago  Vaping Use   Vaping status: Never Used  Substance and Sexual Activity   Alcohol use: Yes    Alcohol/week: 0.0 standard drinks of alcohol    Comment: rarely   Drug use: No   Sexual activity: Not on file  Other Topics Concern   Not on file  Social History Narrative   Lives  with sister   .     Social Drivers of Corporate investment banker Strain: Low Risk  (12/22/2023)   Received from [facility]   Overall Financial Resource Strain (CARDIA)    Difficulty of Paying Living Expenses: Not very hard  Food Insecurity: No Food Insecurity (03/14/2024)   Received from [facility]   Hunger Vital Sign    Within the past 12 months, you worried that your food would run out before you got the money to buy more.: Never true    Within the past 12 months, the food you bought just didn't last and you didn't have money to get more.: Never true  Transportation Needs: No Transportation Needs (03/14/2024)   Received from [facility] - Transportation    Lack of Transportation (Medical): No    Lack of Transportation (Non-Medical): No  Physical Activity: Sufficiently Active (12/22/2023)   Received from [facility]   Exercise Vital Sign    On average, how many days per week do you engage in moderate to strenuous exercise (like a brisk walk)?: 5 days    On average, how many minutes do you engage in exercise at this level?: 40 min  Stress: No Stress Concern Present (03/14/2024)   Received from [facility] - Occupational Stress Questionnaire    Feeling of Stress : Only a little  Recent Concern: Stress - Stress Concern Present (12/22/2023)   Received from [facility] - Occupational Stress Questionnaire    Feeling of Stress : To some extent  Social Connections: Moderately Integrated (12/22/2023)   Received from [facility]   Social Network    How would you rate your social network (family, work, friends)?: Adequate participation with social networks  Intimate Partner Violence: Not At Risk (03/14/2024)   Received from [facility]   HITS    Over the last 12 months how often did your partner physically hurt you?: Never    Over the last 12 months how often did your partner insult  you or talk down to you?: Never    Over the last 12 months how often did your partner threaten you with physical harm?: Never    Over the last 12 months how often did your partner scream or curse at you?: Never   Family History  Problem Relation Age of Onset   Heart attack Mother 94       100% occlusion of LAD   Stroke Father  cause of death   Atrial fibrillation Father     Objective: Office vital signs reviewed. BP 122/72   Pulse 86   Temp 98.6 F (37 C)   Ht 5' 9 (1.753 m)   Wt (!) 344 lb (156 kg)   SpO2 94%   BMI 50.80 kg/m   Physical Examination:  General: Awake, alert, well-appearing male, No acute distress HEENT: sclera white, MMM Cardio: regular rate and rhythm, S1S2 heard, no murmurs appreciated Pulm: clear to auscultation bilaterally, no wheezes, rhonchi or rales; normal work of breathing on room air GI: soft, non-tender, non-distended, bowel sounds present x4, no hepatomegaly, no splenomegaly, no masses Skin: Several postsurgical sites noted along the abdomen with staples in place.  No evidence of dehiscence or secondary bacterial infection.  He does have several areas of contact dermatitis where the adhesives have been laid along the skin.  Assessment/ Plan: 48 y.o. male   S/P laparoscopic sleeve gastrectomy  Morbid obesity with BMI of 50.0-59.9, adult (HCC)  OSA (obstructive sleep apnea)  Essential hypertension, benign  Allergic contact dermatitis due to adhesives  Doing really well postop.  I redressed all of his postsurgical sites with paper tape.  He certainly had some evidence of allergic contact dermatitis due to the adhesive so I gave him instructions to avoid his old tape.  I do not anticipate any barriers to having the staples removed on Wednesday.  He has done an excellent job so far with BMI reduction.  He started at max weight here at 429 and is down to 344 pounds now.  Hopefully he can stay off of blood pressure medication and  obstructive sleep apnea will reverse with weight loss  Plan to see him back in 6 months for full physical with fasting labs, sooner if concerns arise.   Norene CHRISTELLA Fielding, DO [facility] [phone]

## 2024-03-21 DIAGNOSIS — Z4802 Encounter for removal of sutures: Secondary | ICD-10-CM | POA: Diagnosis not present

## 2024-04-07 DIAGNOSIS — I10 Essential (primary) hypertension: Secondary | ICD-10-CM | POA: Diagnosis not present

## 2024-04-07 DIAGNOSIS — M791 Myalgia, unspecified site: Secondary | ICD-10-CM | POA: Diagnosis not present

## 2024-04-07 DIAGNOSIS — Z87891 Personal history of nicotine dependence: Secondary | ICD-10-CM | POA: Diagnosis not present

## 2024-04-07 DIAGNOSIS — L03116 Cellulitis of left lower limb: Secondary | ICD-10-CM | POA: Diagnosis not present

## 2024-04-07 DIAGNOSIS — M7989 Other specified soft tissue disorders: Secondary | ICD-10-CM | POA: Diagnosis not present

## 2024-04-20 ENCOUNTER — Ambulatory Visit: Admitting: Family Medicine

## 2024-05-30 DIAGNOSIS — Z23 Encounter for immunization: Secondary | ICD-10-CM | POA: Diagnosis not present

## 2024-05-30 DIAGNOSIS — L03116 Cellulitis of left lower limb: Secondary | ICD-10-CM | POA: Diagnosis not present

## 2024-05-30 DIAGNOSIS — K760 Fatty (change of) liver, not elsewhere classified: Secondary | ICD-10-CM | POA: Diagnosis not present

## 2024-05-30 DIAGNOSIS — I10 Essential (primary) hypertension: Secondary | ICD-10-CM | POA: Diagnosis not present

## 2024-06-02 ENCOUNTER — Other Ambulatory Visit: Payer: Self-pay | Admitting: Family Medicine

## 2024-06-02 DIAGNOSIS — E7800 Pure hypercholesterolemia, unspecified: Secondary | ICD-10-CM

## 2024-06-07 DIAGNOSIS — L0390 Cellulitis, unspecified: Secondary | ICD-10-CM | POA: Diagnosis not present

## 2024-06-07 DIAGNOSIS — R059 Cough, unspecified: Secondary | ICD-10-CM | POA: Diagnosis not present

## 2024-06-07 DIAGNOSIS — E66813 Obesity, class 3: Secondary | ICD-10-CM | POA: Diagnosis not present

## 2024-06-07 DIAGNOSIS — G4733 Obstructive sleep apnea (adult) (pediatric): Secondary | ICD-10-CM | POA: Diagnosis not present

## 2024-06-07 DIAGNOSIS — I10 Essential (primary) hypertension: Secondary | ICD-10-CM | POA: Diagnosis not present

## 2024-06-07 DIAGNOSIS — Z6841 Body Mass Index (BMI) 40.0 and over, adult: Secondary | ICD-10-CM | POA: Diagnosis not present

## 2024-06-07 DIAGNOSIS — J3089 Other allergic rhinitis: Secondary | ICD-10-CM | POA: Diagnosis not present

## 2024-09-28 ENCOUNTER — Ambulatory Visit

## 2024-09-28 ENCOUNTER — Ambulatory Visit: Payer: Self-pay
